# Patient Record
Sex: Male | Born: 1960
Health system: Southern US, Community
[De-identification: ages and names within clinical notes are randomized; demographics above are authoritative.]

## PROBLEM LIST (undated history)

## (undated) DIAGNOSIS — I471 Supraventricular tachycardia, unspecified: Secondary | ICD-10-CM

## (undated) DIAGNOSIS — R51 Headache: Secondary | ICD-10-CM

## (undated) DIAGNOSIS — R519 Headache, unspecified: Secondary | ICD-10-CM

## (undated) DIAGNOSIS — R011 Cardiac murmur, unspecified: Secondary | ICD-10-CM

## (undated) DIAGNOSIS — K219 Gastro-esophageal reflux disease without esophagitis: Secondary | ICD-10-CM

## (undated) DIAGNOSIS — M1000 Idiopathic gout, unspecified site: Secondary | ICD-10-CM

## (undated) DIAGNOSIS — Z8672 Personal history of thrombophlebitis: Secondary | ICD-10-CM

## (undated) DIAGNOSIS — K5790 Diverticulosis of intestine, part unspecified, without perforation or abscess without bleeding: Secondary | ICD-10-CM

## (undated) DIAGNOSIS — I10 Essential (primary) hypertension: Secondary | ICD-10-CM

## (undated) DIAGNOSIS — U071 COVID-19: Secondary | ICD-10-CM

## (undated) DIAGNOSIS — Z8679 Personal history of other diseases of the circulatory system: Secondary | ICD-10-CM

## (undated) DIAGNOSIS — Z8619 Personal history of other infectious and parasitic diseases: Secondary | ICD-10-CM

## (undated) DIAGNOSIS — E785 Hyperlipidemia, unspecified: Secondary | ICD-10-CM

## (undated) HISTORY — DX: Diverticulosis of intestine, part unspecified, without perforation or abscess without bleeding: K57.90

## (undated) HISTORY — DX: Personal history of thrombophlebitis: Z86.72

## (undated) HISTORY — DX: Hyperlipidemia, unspecified: E78.5

## (undated) HISTORY — DX: Cardiac murmur, unspecified: R01.1

## (undated) HISTORY — DX: Gastro-esophageal reflux disease without esophagitis: K21.9

## (undated) HISTORY — DX: Personal history of other diseases of the circulatory system: Z86.79

## (undated) HISTORY — DX: Personal history of other infectious and parasitic diseases: Z86.19

## (undated) HISTORY — DX: COVID-19: U07.1

## (undated) HISTORY — DX: Essential (primary) hypertension: I10

## (undated) HISTORY — DX: Idiopathic gout, unspecified site: M10.00

## (undated) HISTORY — PX: COLONOSCOPY: SHX174

## (undated) HISTORY — PX: HERNIA REPAIR: SHX51

## (undated) HISTORY — DX: Supraventricular tachycardia, unspecified: I47.10

## (undated) HISTORY — DX: Headache, unspecified: R51.9

## (undated) HISTORY — DX: Headache: R51

---

## 2007-09-01 LAB — PULMONARY FUNCTION TEST

## 2011-05-22 LAB — HM COLONOSCOPY: HM Colonoscopy: NORMAL

## 2012-09-06 HISTORY — PX: OTHER SURGICAL HISTORY: SHX169

## 2013-06-30 ENCOUNTER — Encounter: Payer: Self-pay | Admitting: Internal Medicine

## 2013-06-30 ENCOUNTER — Ambulatory Visit (INDEPENDENT_AMBULATORY_CARE_PROVIDER_SITE_OTHER): Payer: 59 | Admitting: Internal Medicine

## 2013-06-30 VITALS — BP 122/82 | HR 70 | Temp 98.7°F | Resp 16 | Ht 68.0 in | Wt 222.0 lb

## 2013-06-30 DIAGNOSIS — L502 Urticaria due to cold and heat: Secondary | ICD-10-CM

## 2013-06-30 DIAGNOSIS — R1310 Dysphagia, unspecified: Secondary | ICD-10-CM | POA: Insufficient documentation

## 2013-06-30 DIAGNOSIS — E785 Hyperlipidemia, unspecified: Secondary | ICD-10-CM | POA: Insufficient documentation

## 2013-06-30 DIAGNOSIS — I10 Essential (primary) hypertension: Secondary | ICD-10-CM

## 2013-06-30 DIAGNOSIS — J209 Acute bronchitis, unspecified: Secondary | ICD-10-CM

## 2013-06-30 DIAGNOSIS — K219 Gastro-esophageal reflux disease without esophagitis: Secondary | ICD-10-CM

## 2013-06-30 LAB — LIPID PANEL
Cholesterol: 177 mg/dL (ref 0–200)
Glucose: 101
LDL Cholesterol: 94 mg/dL
Triglycerides: 206

## 2013-06-30 MED ORDER — AZITHROMYCIN 500 MG PO TABS: 500.0000 mg | ORAL_TABLET | Freq: Every day | ORAL | Status: DC

## 2013-06-30 MED ORDER — ROSUVASTATIN CALCIUM 10 MG PO TABS: 10.0000 mg | ORAL_TABLET | Freq: Every day | ORAL | Status: DC

## 2013-06-30 MED ORDER — ESOMEPRAZOLE MAGNESIUM 40 MG PO CPDR: 40.0000 mg | DELAYED_RELEASE_CAPSULE | Freq: Every day | ORAL | Status: DC

## 2013-06-30 MED ORDER — METOPROLOL SUCCINATE ER 50 MG PO TB24
50.0000 mg | ORAL_TABLET | Freq: Two times a day (BID) | ORAL | Status: DC
Start: 2013-06-30 — End: 2013-07-02

## 2013-06-30 NOTE — Assessment & Plan Note (Signed)
Will treat with Zpak

## 2013-06-30 NOTE — Assessment & Plan Note (Signed)
GI referral - I question the need for an EGD

## 2013-06-30 NOTE — Assessment & Plan Note (Signed)
Goal achieved 

## 2013-06-30 NOTE — Assessment & Plan Note (Signed)
He needs better symptom relief so I have changed the zantac to nexium

## 2013-06-30 NOTE — Progress Notes (Signed)
Subjective:    Patient ID: Mark [name], male    DOB: 1961-06-08, 52 y.o.   MRN: [id number]  HPI Comments: New to me for an acute visit, no records available from MD's in [place].  Gastrophageal Reflux He complains of belching, choking, coughing, dysphagia, globus sensation, heartburn, a hoarse voice and water brash. He reports no abdominal pain, no chest pain, no early satiety, no nausea, no sore throat, no stridor, no tooth decay or no wheezing. This is a chronic problem. The current episode started more than 1 year ago. The problem occurs frequently. The problem has been gradually worsening. The heartburn duration is several minutes. The heartburn is located in the substernum. The heartburn is of moderate intensity. The heartburn wakes him from sleep. The heartburn does not limit his activity. The heartburn changes with position. Nothing aggravates the symptoms. Associated symptoms include fatigue. Pertinent negatives include no anemia, melena, muscle weakness, orthopnea or weight loss. He has tried a histamine-2 antagonist and an antacid for the symptoms. The treatment provided mild relief. Past procedures include an EGD (3-4 years ago showed erosive changes by his report).  Cough This is a new problem. The current episode started in the past 7 days. The problem has been unchanged. The cough is productive of purulent sputum. Associated symptoms include chills, ear pain, a fever, heartburn, myalgias and sweats. Pertinent negatives include no chest pain, ear congestion, hemoptysis, nasal congestion, postnasal drip, rash, rhinorrhea, sore throat, shortness of breath, weight loss or wheezing. Nothing aggravates the symptoms. He has tried nothing for the symptoms.      Review of Systems  Constitutional: Positive for fever, chills and fatigue. Negative for weight loss, diaphoresis, activity change, appetite change and unexpected weight change.  HENT: Positive for ear pain, hoarse voice and trouble  swallowing (odynophagia). Negative for congestion, sore throat, rhinorrhea, sneezing, voice change, postnasal drip and sinus pressure.   Eyes: Negative.   Respiratory: Positive for cough and choking. Negative for hemoptysis, chest tightness, shortness of breath, wheezing and stridor.   Cardiovascular: Negative for chest pain, palpitations and leg swelling.  Gastrointestinal: Positive for heartburn and dysphagia. Negative for nausea, vomiting, abdominal pain, diarrhea, constipation, blood in stool, melena, abdominal distention, anal bleeding and rectal pain.  Endocrine: Negative.   Genitourinary: Negative.   Musculoskeletal: Positive for myalgias. Negative for back pain, joint swelling, arthralgias, gait problem and muscle weakness.  Skin: Negative.  Negative for color change, pallor, rash and wound.  Allergic/Immunologic: Negative.   Neurological: Negative.   Hematological: Negative.  Negative for adenopathy. Does not bruise/bleed easily.  Psychiatric/Behavioral: Negative.        Objective:   Physical Exam  Vitals reviewed. Constitutional: He is oriented to person, place, and time. He appears well-developed and well-nourished.  Non-toxic appearance. He does not have a sickly appearance. He does not appear ill. No distress.  HENT:  Head: Normocephalic and atraumatic.  Right Ear: Hearing, tympanic membrane, external ear and ear canal normal.  Left Ear: Hearing, tympanic membrane, external ear and ear canal normal.  Nose: No mucosal edema or rhinorrhea. Right sinus exhibits no maxillary sinus tenderness and no frontal sinus tenderness. Left sinus exhibits no maxillary sinus tenderness and no frontal sinus tenderness.  Mouth/Throat: Mucous membranes are normal. Mucous membranes are not pale, not dry and not cyanotic. No oral lesions. No trismus in the jaw. No edematous. Posterior oropharyngeal erythema present. No oropharyngeal exudate, posterior oropharyngeal edema or tonsillar abscesses.   Eyes: Conjunctivae are normal. Right eye exhibits no discharge.  Left eye exhibits no discharge. No scleral icterus.  Neck: Normal range of motion. Neck supple. No JVD present. No tracheal deviation present. No thyromegaly present.  Cardiovascular: Normal rate, regular rhythm, normal heart sounds and intact distal pulses.  Exam reveals no gallop and no friction rub.   No murmur heard. Pulmonary/Chest: Effort normal and breath sounds normal. No stridor. No respiratory distress. He has no wheezes. He has no rales. He exhibits no tenderness.  Abdominal: Soft. Bowel sounds are normal. He exhibits no distension and no mass. There is no tenderness. There is no rebound and no guarding.  Musculoskeletal: Normal range of motion. He exhibits no edema and no tenderness.  Lymphadenopathy:    He has no cervical adenopathy.  Neurological: He is oriented to person, place, and time.  Skin: Skin is warm and dry. No rash noted. He is not diaphoretic. No erythema. No pallor.  Psychiatric: He has a normal mood and affect. His behavior is normal. Judgment and thought content normal.     No results found for this basename: WBC, HGB, HCT, PLT, GLUCOSE, CHOL, TRIG, HDL, LDLDIRECT, LDLCALC, ALT, AST, NA, K, CL, CREATININE, BUN, CO2, TSH, PSA, INR, GLUF, HGBA1C, MICROALBUR       Assessment & Plan:

## 2013-06-30 NOTE — Assessment & Plan Note (Signed)
BP is well controlled 

## 2013-06-30 NOTE — Patient Instructions (Signed)
Acute Bronchitis You have acute bronchitis. This means you have a chest cold. The airways in your lungs are red and sore (inflamed). Acute means it is sudden onset.  CAUSES Bronchitis is most often caused by the same virus that causes a cold. SYMPTOMS   Body aches.  Chest congestion.  Chills.  Cough.  Fever.  Shortness of breath.  Sore throat. TREATMENT  Acute bronchitis is usually treated with rest, fluids, and medicines for relief of fever or cough. Most symptoms should go away after a few days or a week. Increased fluids may help thin your secretions and will prevent dehydration. Your caregiver may give you an inhaler to improve your symptoms. The inhaler reduces shortness of breath and helps control cough. You can take over-the-counter pain relievers or cough medicine to decrease coughing, pain, or fever. A cool-air vaporizer may help thin bronchial secretions and make it easier to clear your chest. Antibiotics are usually not needed but can be prescribed if you smoke, are seriously ill, have chronic lung problems, are elderly, or you are at higher risk for developing complications.Allergies and asthma can make bronchitis worse. Repeated episodes of bronchitis may cause longstanding lung problems. Avoid smoking and secondhand smoke.Exposure to cigarette smoke or irritating chemicals will make bronchitis worse. If you are a cigarette smoker, consider using nicotine gum or skin patches to help control withdrawal symptoms. Quitting smoking will help your lungs heal faster. Recovery from bronchitis is often slow, but you should start feeling better after 2 to 3 days. Cough from bronchitis frequently lasts for 3 to 4 weeks. To prevent another bout of acute bronchitis:  Quit smoking.  Wash your hands frequently to get rid of viruses or use a hand sanitizer.  Avoid other people with cold or virus symptoms.  Try not to touch your hands to your mouth, nose, or eyes. SEEK IMMEDIATE  MEDICAL CARE IF:  You develop increased fever, chills, or chest pain.  You have severe shortness of breath or bloody sputum.  You develop dehydration, fainting, repeated vomiting, or a severe headache.  You have no improvement after 1 week of treatment or you get worse. MAKE SURE YOU:   Understand these instructions.  Will watch your condition.  Will get help right away if you are not doing well or get worse. Document Released: 10/31/2004 Document Revised: 12/16/2011 Document Reviewed: 01/16/2011 [facility] Patient Information 2014 [place], [place]. Gastroesophageal Reflux Disease, Adult Gastroesophageal reflux disease (GERD) happens when acid from your stomach flows up into the esophagus. When acid comes in contact with the esophagus, the acid causes soreness (inflammation) in the esophagus. Over time, GERD may create small holes (ulcers) in the lining of the esophagus. CAUSES   Increased body weight. This puts pressure on the stomach, making acid rise from the stomach into the esophagus.  Smoking. This increases acid production in the stomach.  Drinking alcohol. This causes decreased pressure in the lower esophageal sphincter (valve or ring of muscle between the esophagus and stomach), allowing acid from the stomach into the esophagus.  Late evening meals and a full stomach. This increases pressure and acid production in the stomach.  A malformed lower esophageal sphincter. Sometimes, no cause is found. SYMPTOMS   Burning pain in the lower part of the mid-chest behind the breastbone and in the mid-stomach area. This may occur twice a week or more often.  Trouble swallowing.  Sore throat.  Dry cough.  Asthma-like symptoms including chest tightness, shortness of breath, or wheezing. DIAGNOSIS  Your  caregiver may be able to diagnose GERD based on your symptoms. In some cases, X-rays and other tests may be done to check for complications or to check the condition of your  stomach and esophagus. TREATMENT  Your caregiver may recommend over-the-counter or prescription medicines to help decrease acid production. Ask your caregiver before starting or adding any new medicines.  HOME CARE INSTRUCTIONS   Change the factors that you can control. Ask your caregiver for guidance concerning weight loss, quitting smoking, and alcohol consumption.  Avoid foods and drinks that make your symptoms worse, such as:  Caffeine or alcoholic drinks.  Chocolate.  Peppermint or mint flavorings.  Garlic and onions.  Spicy foods.  Citrus fruits, such as oranges, lemons, or limes.  Tomato-based foods such as sauce, chili, salsa, and pizza.  Fried and fatty foods.  Avoid lying down for the 3 hours prior to your bedtime or prior to taking a nap.  Eat small, frequent meals instead of large meals.  Wear loose-fitting clothing. Do not wear anything tight around your waist that causes pressure on your stomach.  Raise the head of your bed 6 to 8 inches with wood blocks to help you sleep. Extra pillows will not help.  Only take over-the-counter or prescription medicines for pain, discomfort, or fever as directed by your caregiver.  Do not take aspirin, ibuprofen, or other nonsteroidal anti-inflammatory drugs (NSAIDs). SEEK IMMEDIATE MEDICAL CARE IF:   You have pain in your arms, neck, jaw, teeth, or back.  Your pain increases or changes in intensity or duration.  You develop nausea, vomiting, or sweating (diaphoresis).  You develop shortness of breath, or you faint.  Your vomit is green, yellow, black, or looks like coffee grounds or blood.  Your stool is red, bloody, or black. These symptoms could be signs of other problems, such as heart disease, gastric bleeding, or esophageal bleeding. MAKE SURE YOU:   Understand these instructions.  Will watch your condition.  Will get help right away if you are not doing well or get worse. Document Released: 07/03/2005  Document Revised: 12/16/2011 Document Reviewed: 04/12/2011 [facility] Patient Information 2014 [place], [place].

## 2013-07-01 ENCOUNTER — Encounter: Payer: Self-pay | Admitting: Internal Medicine

## 2013-07-01 DIAGNOSIS — K219 Gastro-esophageal reflux disease without esophagitis: Secondary | ICD-10-CM

## 2013-07-01 DIAGNOSIS — I10 Essential (primary) hypertension: Secondary | ICD-10-CM

## 2013-07-01 DIAGNOSIS — R1310 Dysphagia, unspecified: Secondary | ICD-10-CM

## 2013-07-01 DIAGNOSIS — E785 Hyperlipidemia, unspecified: Secondary | ICD-10-CM

## 2013-07-02 ENCOUNTER — Other Ambulatory Visit: Payer: Self-pay | Admitting: Internal Medicine

## 2013-07-02 MED ORDER — ESOMEPRAZOLE MAGNESIUM 40 MG PO CPDR: 40.0000 mg | DELAYED_RELEASE_CAPSULE | Freq: Every day | ORAL | Status: DC

## 2013-07-02 MED ORDER — METOPROLOL SUCCINATE ER 50 MG PO TB24: 50.0000 mg | ORAL_TABLET | Freq: Two times a day (BID) | ORAL | Status: DC

## 2013-07-02 MED ORDER — ROSUVASTATIN CALCIUM 10 MG PO TABS: 10.0000 mg | ORAL_TABLET | Freq: Every day | ORAL | Status: DC

## 2013-07-27 ENCOUNTER — Telehealth: Payer: Self-pay

## 2013-07-27 DIAGNOSIS — I10 Essential (primary) hypertension: Secondary | ICD-10-CM

## 2013-07-27 MED ORDER — METOPROLOL SUCCINATE ER 50 MG PO TB24: 50.0000 mg | ORAL_TABLET | Freq: Every day | ORAL | Status: DC

## 2013-07-27 NOTE — Telephone Encounter (Signed)
Updated RX sent to pharmacy.

## 2013-07-27 NOTE — Telephone Encounter (Signed)
Received fax requesting clarification on toprol XL 50 mg. RX in system shows 1 tab BID #, should this be QD or BID?

## 2013-07-27 NOTE — Telephone Encounter (Signed)
Qd for the XL product

## 2013-08-09 ENCOUNTER — Other Ambulatory Visit: Payer: Self-pay | Admitting: Gastroenterology

## 2013-08-09 DIAGNOSIS — R1310 Dysphagia, unspecified: Secondary | ICD-10-CM

## 2013-08-23 ENCOUNTER — Other Ambulatory Visit: Payer: 59

## 2013-09-06 ENCOUNTER — Ambulatory Visit (INDEPENDENT_AMBULATORY_CARE_PROVIDER_SITE_OTHER): Payer: 59 | Admitting: Internal Medicine

## 2013-09-06 ENCOUNTER — Encounter: Payer: Self-pay | Admitting: Internal Medicine

## 2013-09-06 VITALS — BP 116/72 | HR 61 | Temp 98.5°F | Resp 16 | Ht 68.0 in | Wt 215.0 lb

## 2013-09-06 DIAGNOSIS — Z23 Encounter for immunization: Secondary | ICD-10-CM

## 2013-09-06 DIAGNOSIS — Z0000 Encounter for general adult medical examination without abnormal findings: Secondary | ICD-10-CM

## 2013-09-06 NOTE — Assessment & Plan Note (Signed)
Exam done Vaccines were updated Labs ordered Pt ed material was given 

## 2013-09-06 NOTE — Patient Instructions (Signed)
Health Maintenance, Males A healthy lifestyle and preventative care can promote health and wellness.  Maintain regular health, dental, and eye exams.  Eat a healthy diet. Foods like vegetables, fruits, whole grains, low-fat dairy products, and lean protein foods contain the nutrients you need without too many calories. Decrease your intake of foods high in solid fats, added sugars, and salt. Get information about a proper diet from your caregiver, if necessary.  Regular physical exercise is one of the most important things you can do for your health. Most adults should get at least 150 minutes of moderate-intensity exercise (any activity that increases your heart rate and causes you to sweat) each week. In addition, most adults need muscle-strengthening exercises on 2 or more days a week.   Maintain a healthy weight. The body mass index (BMI) is a screening tool to identify possible weight problems. It provides an estimate of body fat based on height and weight. Your caregiver can help determine your BMI, and can help you achieve or maintain a healthy weight. For adults 20 years and older:  A BMI below 18.5 is considered underweight.  A BMI of 18.5 to 24.9 is normal.  A BMI of 25 to 29.9 is considered overweight.  A BMI of 30 and above is considered obese.  Maintain normal blood lipids and cholesterol by exercising and minimizing your intake of saturated fat. Eat a balanced diet with plenty of fruits and vegetables. Blood tests for lipids and cholesterol should begin at age 20 and be repeated every 5 years. If your lipid or cholesterol levels are high, you are over 50, or you are a high risk for heart disease, you may need your cholesterol levels checked more frequently.Ongoing high lipid and cholesterol levels should be treated with medicines, if diet and exercise are not effective.  If you smoke, find out from your caregiver how to quit. If you do not use tobacco, do not start.  Lung  cancer screening is recommended for adults aged 55 80 years who are at high risk for developing lung cancer because of a history of smoking. Yearly low-dose computed tomography (CT) is recommended for people who have at least a 30-pack-year history of smoking and are a current smoker or have quit within the past 15 years. A pack year of smoking is smoking an average of 1 pack of cigarettes a day for 1 year (for example: 1 pack a day for 30 years or 2 packs a day for 15 years). Yearly screening should continue until the smoker has stopped smoking for at least 15 years. Yearly screening should also be stopped for people who develop a health problem that would prevent them from having lung cancer treatment.  If you choose to drink alcohol, do not exceed 2 drinks per day. One drink is considered to be 12 ounces (355 mL) of beer, 5 ounces (148 mL) of wine, or 1.5 ounces (44 mL) of liquor.  Avoid use of street drugs. Do not share needles with anyone. Ask for help if you need support or instructions about stopping the use of drugs.  High blood pressure causes heart disease and increases the risk of stroke. Blood pressure should be checked at least every 1 to 2 years. Ongoing high blood pressure should be treated with medicines if weight loss and exercise are not effective.  If you are 45 to 53 years old, ask your caregiver if you should take aspirin to prevent heart disease.  Diabetes screening involves taking a blood   sample to check your fasting blood sugar level. This should be done once every 3 years, after age 45, if you are within normal weight and without risk factors for diabetes. Testing should be considered at a younger age or be carried out more frequently if you are overweight and have at least 1 risk factor for diabetes.  Colorectal cancer can be detected and often prevented. Most routine colorectal cancer screening begins at the age of 50 and continues through age 75. However, your caregiver may  recommend screening at an earlier age if you have risk factors for colon cancer. On a yearly basis, your caregiver may provide home test kits to check for hidden blood in the stool. Use of a small camera at the end of a tube, to directly examine the colon (sigmoidoscopy or colonoscopy), can detect the earliest forms of colorectal cancer. Talk to your caregiver about this at age 50, when routine screening begins. Direct examination of the colon should be repeated every 5 to 10 years through age 75, unless early forms of pre-cancerous polyps or small growths are found.  Hepatitis C blood testing is recommended for all people born from 1945 through 1965 and any individual with known risks for hepatitis C.  Healthy men should no longer receive prostate-specific antigen (PSA) blood tests as part of routine cancer screening. Consult with your caregiver about prostate cancer screening.  Testicular cancer screening is not recommended for adolescents or adult males who have no symptoms. Screening includes self-exam, caregiver exam, and other screening tests. Consult with your caregiver about any symptoms you have or any concerns you have about testicular cancer.  Practice safe sex. Use condoms and avoid high-risk sexual practices to reduce the spread of sexually transmitted infections (STIs).  Use sunscreen. Apply sunscreen liberally and repeatedly throughout the day. You should seek shade when your shadow is shorter than you. Protect yourself by wearing long sleeves, pants, a wide-brimmed hat, and sunglasses year round, whenever you are outdoors.  Notify your caregiver of new moles or changes in moles, especially if there is a change in shape or color. Also notify your caregiver if a mole is larger than the size of a pencil eraser.  A one-time screening for abdominal aortic aneurysm (AAA) and surgical repair of large AAAs by sound wave imaging (ultrasonography) is recommended for ages 65 to 75 years who are  current or former smokers.  Stay current with your immunizations. Document Released: 03/21/2008 Document Revised: 01/18/2013 Document Reviewed: 02/18/2011 [employer] Patient Information 2014 [employer].  

## 2013-09-06 NOTE — Progress Notes (Signed)
Pre visit review using our clinic review tool, if applicable. No additional management support is needed unless otherwise documented below in the visit note. 

## 2013-09-06 NOTE — Progress Notes (Signed)
   Subjective:    Patient ID: Mark [name], male    DOB: Feb 12, 1961, 52 y.o.   MRN: [id number]  HPI  He returns for a complete physical - he tells me that he feels well and offers no complaints. He saw GI and was told to take the nexium BID and that has resolved his UGI s/s.  Review of Systems  Constitutional: Negative.  Negative for fever, chills, diaphoresis, activity change, appetite change, fatigue and unexpected weight change.  HENT: Negative.   Eyes: Negative.   Respiratory: Negative.  Negative for cough, chest tightness, shortness of breath, wheezing and stridor.   Cardiovascular: Negative.  Negative for chest pain, palpitations and leg swelling.  Gastrointestinal: Negative.  Negative for nausea, vomiting, abdominal pain, diarrhea, constipation, blood in stool, abdominal distention, anal bleeding and rectal pain.  Endocrine: Negative.   Genitourinary: Negative.  Negative for difficulty urinating.  Musculoskeletal: Negative.   Skin: Negative.   Allergic/Immunologic: Negative.   Neurological: Negative.  Negative for dizziness, tremors, weakness, light-headedness, numbness and headaches.  Hematological: Negative.  Negative for adenopathy. Does not bruise/bleed easily.  Psychiatric/Behavioral: Negative.        Objective:   Physical Exam  Vitals reviewed. Constitutional: He is oriented to person, place, and time. He appears well-developed and well-nourished. No distress.  HENT:  Head: Normocephalic and atraumatic.  Mouth/Throat: Oropharynx is clear and moist. No oropharyngeal exudate.  Eyes: Conjunctivae are normal. Right eye exhibits no discharge. Left eye exhibits no discharge. No scleral icterus.  Neck: Normal range of motion. Neck supple. No JVD present. No tracheal deviation present. No thyromegaly present.  Cardiovascular: Normal rate, regular rhythm, normal heart sounds and intact distal pulses.  Exam reveals no gallop and no friction rub.   No murmur  heard. Pulmonary/Chest: Effort normal and breath sounds normal. No stridor. No respiratory distress. He has no wheezes. He has no rales. He exhibits no tenderness.  Abdominal: Soft. Bowel sounds are normal. He exhibits no distension and no mass. There is no tenderness. There is no rebound and no guarding. Hernia confirmed negative in the right inguinal area and confirmed negative in the left inguinal area.  Genitourinary: Rectum normal, prostate normal, testes normal and penis normal. Rectal exam shows no external hemorrhoid, no internal hemorrhoid, no fissure, no mass, no tenderness and anal tone normal. Guaiac negative stool. Prostate is not enlarged and not tender. Right testis shows no mass, no swelling and no tenderness. Right testis is descended. Left testis shows no mass, no swelling and no tenderness. Left testis is descended. Circumcised. No penile erythema or penile tenderness. No discharge found.  Musculoskeletal: Normal range of motion. He exhibits no edema and no tenderness.  Lymphadenopathy:    He has no cervical adenopathy.       Right: No inguinal adenopathy present.       Left: No inguinal adenopathy present.  Neurological: He is oriented to person, place, and time.  Skin: Skin is warm and dry. No rash noted. He is not diaphoretic. No erythema. No pallor.  Psychiatric: He has a normal mood and affect. His behavior is normal. Judgment and thought content normal.     Lab Results  Component Value Date   CHOL 177 06/04/2013   TRIG 206 06/04/2013   HDL 42 06/04/2013   LDLCALC 94 06/04/2013       Assessment & Plan:

## 2013-09-14 LAB — BASIC METABOLIC PANEL
Glucose: 104 mg/dL
Potassium: 4.3 mmol/L (ref 3.4–5.3)
Sodium: 143 mmol/L (ref 137–147)

## 2013-09-14 LAB — CBC AND DIFFERENTIAL
HCT: 41 % (ref 41–53)
Hemoglobin: 14.3 g/dL (ref 13.5–17.5)
Neutrophils Absolute: 4 /uL
Platelets: 250 10*3/uL (ref 150–399)

## 2013-09-14 LAB — LIPID PANEL
Cholesterol: 140 mg/dL (ref 0–200)
HDL: 42 mg/dL (ref 35–70)

## 2013-09-15 ENCOUNTER — Encounter: Payer: Self-pay | Admitting: Internal Medicine

## 2013-09-15 LAB — CBC WITH DIFFERENTIAL
Grans (Absolute): 0
MCH: 30.1
MCHC: 35
MCV: 86 fL
Monocytes(Absolute): 0.7
RBC: 4.75

## 2013-09-15 LAB — URINALYSIS
Bilirubin Urine: 0 mg/dL
Glucose, Ur: NEGATIVE
Urobilinogen, Ur: 0.2
WBC, UA: NEGATIVE
pH, UA: 6 (ref 4.5–8.0)

## 2013-09-15 LAB — HEPATITIS C ANTIBODY: HCV Ab: <0.1

## 2013-09-15 LAB — CMP14+EGFR
Albumin/Globulin Ratio: 2.6
BUN/Creatinine Ratio: 10
Calcium: 8.7 mg/dL (ref 8.7–10.7)
Chloride, Serum: 103
EGFR: 65 mg/dL
EGFR: 75 mg/dL
Globulin, Total: 1.6

## 2013-09-15 LAB — PSA: PSA: 0.3

## 2013-10-04 ENCOUNTER — Encounter: Payer: Self-pay | Admitting: Internal Medicine

## 2014-02-14 ENCOUNTER — Other Ambulatory Visit: Payer: Self-pay | Admitting: *Deleted

## 2014-02-14 DIAGNOSIS — K219 Gastro-esophageal reflux disease without esophagitis: Secondary | ICD-10-CM

## 2014-02-14 DIAGNOSIS — R1310 Dysphagia, unspecified: Secondary | ICD-10-CM

## 2014-02-14 MED ORDER — ESOMEPRAZOLE MAGNESIUM 40 MG PO CPDR: 40.0000 mg | DELAYED_RELEASE_CAPSULE | Freq: Every day | ORAL | Status: DC

## 2014-06-23 ENCOUNTER — Ambulatory Visit (INDEPENDENT_AMBULATORY_CARE_PROVIDER_SITE_OTHER): Payer: 59 | Admitting: Internal Medicine

## 2014-06-23 ENCOUNTER — Encounter: Payer: Self-pay | Admitting: Internal Medicine

## 2014-06-23 VITALS — BP 112/70 | HR 61 | Temp 98.5°F | Resp 16 | Ht 68.0 in | Wt 211.0 lb

## 2014-06-23 DIAGNOSIS — E785 Hyperlipidemia, unspecified: Secondary | ICD-10-CM

## 2014-06-23 DIAGNOSIS — K2100 Gastro-esophageal reflux disease with esophagitis, without bleeding: Secondary | ICD-10-CM

## 2014-06-23 DIAGNOSIS — Z23 Encounter for immunization: Secondary | ICD-10-CM

## 2014-06-23 DIAGNOSIS — Z0000 Encounter for general adult medical examination without abnormal findings: Secondary | ICD-10-CM

## 2014-06-23 DIAGNOSIS — I10 Essential (primary) hypertension: Secondary | ICD-10-CM

## 2014-06-23 LAB — FECAL OCCULT BLOOD, GUAIAC: Fecal Occult Blood: NEGATIVE

## 2014-06-23 MED ORDER — ESOMEPRAZOLE MAGNESIUM 40 MG PO CPDR: 40.0000 mg | DELAYED_RELEASE_CAPSULE | Freq: Two times a day (BID) | ORAL | Status: DC

## 2014-06-23 MED ORDER — ROSUVASTATIN CALCIUM 10 MG PO TABS
10.0000 mg | ORAL_TABLET | Freq: Every day | ORAL | Status: DC
Start: 2014-06-23 — End: 2015-06-08

## 2014-06-23 MED ORDER — RIZATRIPTAN BENZOATE 10 MG PO TABS: 10.0000 mg | ORAL_TABLET | ORAL | Status: AC | PRN

## 2014-06-23 MED ORDER — METOPROLOL SUCCINATE ER 50 MG PO TB24: 50.0000 mg | ORAL_TABLET | Freq: Every day | ORAL | Status: DC

## 2014-06-23 NOTE — Progress Notes (Signed)
Pre visit review using our clinic review tool, if applicable. No additional management support is needed unless otherwise documented below in the visit note. 

## 2014-06-23 NOTE — Progress Notes (Signed)
   Subjective:    Patient ID: Mark [name], male    DOB: [birth year], 53 y.o.   MRN: [id number]  HPI Comments: Her returns for a physical, he tells me that he feels well and offers no complaints.     Review of Systems  Constitutional: Negative for fever, chills, diaphoresis, appetite change and fatigue.  HENT: Negative.   Eyes: Negative.   Respiratory: Negative.  Negative for cough, choking, chest tightness, shortness of breath and stridor.   Cardiovascular: Negative.  Negative for chest pain, palpitations and leg swelling.  Gastrointestinal: Negative.  Negative for nausea, vomiting, abdominal pain, diarrhea, constipation and blood in stool.  Endocrine: Negative.   Genitourinary: Negative.  Negative for difficulty urinating.  Musculoskeletal: Negative.  Negative for arthralgias, back pain, joint swelling, myalgias and neck pain.  Skin: Negative.  Negative for rash.  Allergic/Immunologic: Negative.   Neurological: Negative.        His last HA was 6 months ago  Hematological: Negative.  Negative for adenopathy. Does not bruise/bleed easily.  Psychiatric/Behavioral: Negative.        Objective:   Physical Exam  Vitals reviewed. Constitutional: He is oriented to person, place, and time. He appears well-developed and well-nourished. No distress.  HENT:  Head: Normocephalic and atraumatic.  Mouth/Throat: Oropharynx is clear and moist. No oropharyngeal exudate.  Eyes: Conjunctivae are normal. Right eye exhibits no discharge. Left eye exhibits no discharge. No scleral icterus.  Neck: Normal range of motion. Neck supple. No JVD present. No tracheal deviation present. No thyromegaly present.  Cardiovascular: Normal rate, regular rhythm, normal heart sounds and intact distal pulses.  Exam reveals no gallop and no friction rub.   No murmur heard. Pulmonary/Chest: Effort normal and breath sounds normal. No stridor. No respiratory distress. He has no wheezes. He has no rales. He exhibits no  tenderness.  Abdominal: Soft. Bowel sounds are normal. He exhibits no distension and no mass. There is no tenderness. There is no rebound and no guarding. Hernia confirmed negative in the right inguinal area and confirmed negative in the left inguinal area.  Genitourinary: Rectum normal, prostate normal, testes normal and penis normal. Rectal exam shows no external hemorrhoid, no internal hemorrhoid, no fissure, no mass, no tenderness and anal tone normal. Guaiac negative stool. Prostate is not enlarged and not tender. Right testis shows no mass, no swelling and no tenderness. Right testis is descended. Left testis shows no mass, no swelling and no tenderness. Left testis is descended. Circumcised. No penile erythema or penile tenderness. No discharge found.  Musculoskeletal: Normal range of motion. He exhibits no edema and no tenderness.  Lymphadenopathy:    He has no cervical adenopathy.       Right: No inguinal adenopathy present.       Left: No inguinal adenopathy present.  Neurological: He is oriented to person, place, and time.  Skin: Skin is warm and dry. No rash noted. He is not diaphoretic. No erythema. No pallor.  Psychiatric: He has a normal mood and affect. His behavior is normal. Judgment and thought content normal.          Assessment & Plan:

## 2014-06-23 NOTE — Assessment & Plan Note (Signed)
Exam done Vaccines were updated Labs ordered Pt ed material was given 

## 2014-06-23 NOTE — Patient Instructions (Signed)

## 2014-08-19 ENCOUNTER — Ambulatory Visit: Payer: 59 | Admitting: Internal Medicine

## 2014-08-20 LAB — CBC WITH DIFFERENTIAL/PLATELET
BASO%: 0 %
BASOS ABS: 0 /uL
EOS ABS: 0 /uL
EOS%: 2 %
GRANULOCYTE PERCENT: 0 % — AB (ref 37–80)
Granulocyte count absolute: 0
Hematocrit: 43.4 % (ref 38.5–51)
Hemoglobin: 14.9 g/dL (ref 13–17)
LYMPH%: 29 %
Lymphs(Absolute): 1.8
MCH: 29.2
MCHC: 34.3
MCV: 85 fL (ref 80–98)
MONO: 9 /uL
Monocyes absolute: 0.6 10*3/uL (ref 0.1–1)
NEUTROPHILS RELATIVE % (KUC): 60 % (ref 44–76)
Neutrophils absolute (GR#): 3.8 10*3/uL (ref 1.7–7.7)
RBC: 5.1
RDW: 13.8
WBC: 6.3

## 2014-08-20 LAB — COMPREHENSIVE METABOLIC PANEL
ALK PHOS: 65 U/L
ALT: 11 U/L (ref 10–40)
AST: 17 U/L
Albumin/Globulin Ratio: 2.3
Albumin: 4.4
BUN/Creatinine Ratio: 76
BUN: 21 mg/dL (ref 4–21)
CALCIUM: 9.4 mg/dL
CHLORIDE: 100 mmol/L
CO2: 24 mmol/L
GFR, Est African American: 66
GFR, Est Non African American: 1.23
GLOBULIN, TOTAL: 1.9
GLUCOSE: 110
Potassium: 5.2 mmol/L
Sodium, serum: 17
Total Bilirubin: 0.5 mg/dL
Total Protein: 6.3 g/dL

## 2014-08-20 LAB — LIPID PANEL
CHOL/HDL RATIO: 3.2
CHOLESTEROL, TOTAL: 139
HDL: 44 mg/dL (ref 35–70)
LDL Calculated: 77 mg/dL
PSA: 0.3
TRIGLYCERIDES: 91
TSH: 1.35
VLDL Cholesterol Cal: 18

## 2014-08-26 ENCOUNTER — Encounter: Payer: Self-pay | Admitting: Internal Medicine

## 2014-08-26 ENCOUNTER — Telehealth: Payer: Self-pay | Admitting: Internal Medicine

## 2014-08-26 ENCOUNTER — Ambulatory Visit (INDEPENDENT_AMBULATORY_CARE_PROVIDER_SITE_OTHER): Payer: 59 | Admitting: Internal Medicine

## 2014-08-26 VITALS — BP 116/70 | HR 61 | Temp 98.4°F | Ht 68.0 in | Wt 212.0 lb

## 2014-08-26 DIAGNOSIS — M1000 Idiopathic gout, unspecified site: Secondary | ICD-10-CM | POA: Insufficient documentation

## 2014-08-26 DIAGNOSIS — I10 Essential (primary) hypertension: Secondary | ICD-10-CM

## 2014-08-26 DIAGNOSIS — L502 Urticaria due to cold and heat: Secondary | ICD-10-CM

## 2014-08-26 MED ORDER — IBUPROFEN 600 MG PO TABS: 600.0000 mg | ORAL_TABLET | Freq: Three times a day (TID) | ORAL | Status: DC | PRN

## 2014-08-26 MED ORDER — EPINEPHRINE 0.3 MG/0.3ML IJ SOAJ: 0.3000 mg | Freq: Once | INTRAMUSCULAR | Status: DC

## 2014-08-26 MED ORDER — COLCHICINE 0.6 MG PO TABS: 0.6000 mg | ORAL_TABLET | Freq: Two times a day (BID) | ORAL | Status: DC

## 2014-08-26 MED ORDER — FEBUXOSTAT 40 MG PO TABS: 40.0000 mg | ORAL_TABLET | Freq: Every day | ORAL | Status: DC

## 2014-08-26 NOTE — Progress Notes (Signed)
   Subjective:    Patient ID: Mark [name], male    DOB: 1961/02/19, 53 y.o.   MRN: [id number]  Arthritis Presents for follow-up visit. He complains of pain, stiffness and joint swelling. He reports no joint warmth. Affected locations include the left foot and right foot. His pain is at a severity of 3/10. Associated symptoms include pain at night and pain while resting. Pertinent negatives include no diarrhea, dry eyes, dry mouth, dysuria, fatigue, fever, rash, Raynaud's syndrome, uveitis or weight loss. His past medical history is significant for osteoarthritis. There is no history of chronic back pain, lupus, psoriasis or rheumatoid arthritis.  Past treatments include nothing.      Review of Systems  Constitutional: Negative.  Negative for fever, chills, weight loss, diaphoresis, appetite change and fatigue.  HENT: Negative.   Eyes: Negative.   Respiratory: Negative.   Cardiovascular: Negative.   Gastrointestinal: Negative.  Negative for nausea, diarrhea, constipation and blood in stool.  Endocrine: Negative.   Genitourinary: Negative.  Negative for dysuria.  Musculoskeletal: Positive for joint swelling, arthralgias, arthritis and stiffness.  Skin: Negative.  Negative for rash.  Allergic/Immunologic: Negative.   Neurological: Negative.   Hematological: Negative.  Negative for adenopathy.  Psychiatric/Behavioral: Negative.        Objective:   Physical Exam  Constitutional: He is oriented to person, place, and time. He appears well-developed and well-nourished. No distress.  HENT:  Head: Normocephalic and atraumatic.  Mouth/Throat: Oropharynx is clear and moist. No oropharyngeal exudate.  Eyes: Conjunctivae are normal. Right eye exhibits no discharge. Left eye exhibits no discharge. No scleral icterus.  Neck: Normal range of motion. Neck supple. No JVD present. No tracheal deviation present. No thyromegaly present.  Cardiovascular: Normal rate, regular rhythm, normal heart  sounds and intact distal pulses.  Exam reveals no gallop and no friction rub.   No murmur heard. Pulmonary/Chest: Effort normal and breath sounds normal. No stridor. No respiratory distress. He has no wheezes. He has no rales. He exhibits no tenderness.  Abdominal: Soft. Bowel sounds are normal. He exhibits no distension and no mass. There is no tenderness. There is no rebound and no guarding.  Musculoskeletal: Normal range of motion. He exhibits no edema.       Right foot: There is tenderness, bony tenderness and swelling. There is normal range of motion, normal capillary refill, no crepitus, no deformity and no laceration.       Feet:  Lymphadenopathy:    He has no cervical adenopathy.  Neurological: He is oriented to person, place, and time.  Skin: Skin is warm and dry. No rash noted. He is not diaphoretic. No erythema. No pallor.  Vitals reviewed.     Lab Results  Component Value Date   WBC 6.9 09/14/2013   HGB 14.3 09/14/2013   HCT 41 09/14/2013   PLT 250 09/14/2013   CHOL 140 09/14/2013   TRIG 106 09/14/2013   HDL 42 09/14/2013   LDLCALC 77 09/14/2013   ALT 18 09/14/2013   AST 25 09/14/2013   NA 143 09/14/2013   K 4.3 09/14/2013   CREATININE 1.3 09/14/2013   BUN 12 09/14/2013   TSH 1.71 09/14/2013   PSA 0.3 09/14/2013      Assessment & Plan:

## 2014-08-26 NOTE — Assessment & Plan Note (Signed)
His BP is well controlled 

## 2014-08-26 NOTE — Telephone Encounter (Signed)
Patient just seen Dr. Jenny Reichmann.  States Dr. Jenny Reichmann would need to call script [facility] Popper) in and they are also requesting PA.  He is also requesting an additional script to be called in to [facility].  FYI our fax has been down since this morning.  [facility] [place] [address]

## 2014-08-26 NOTE — Patient Instructions (Signed)

## 2014-08-26 NOTE — Progress Notes (Signed)
Pre visit review using our clinic review tool, if applicable. No additional management support is needed unless otherwise documented below in the visit note. 

## 2014-08-26 NOTE — Assessment & Plan Note (Signed)
He has had a recent flare that is now resolving Will check his uric acid level as well as his renal function today Will start uloric since he reports frequent flares over the last few years Will start motrin and colcrys to be used and as directed during flares

## 2014-08-29 ENCOUNTER — Telehealth: Payer: Self-pay | Admitting: Internal Medicine

## 2014-08-29 MED ORDER — ALLOPURINOL 100 MG PO TABS: 100.0000 mg | ORAL_TABLET | Freq: Every day | ORAL | Status: DC

## 2014-08-29 NOTE — Telephone Encounter (Signed)
Change to allopurinol

## 2014-08-29 NOTE — Telephone Encounter (Signed)
emmi emailed °

## 2014-08-30 MED ORDER — ALLOPURINOL 100 MG PO TABS: 100.0000 mg | ORAL_TABLET | Freq: Every day | ORAL | Status: DC

## 2014-08-30 NOTE — Telephone Encounter (Signed)
Spoke to pt. Pt needed to have rx sent to [facility]. This has been done.

## 2015-02-27 ENCOUNTER — Ambulatory Visit (INDEPENDENT_AMBULATORY_CARE_PROVIDER_SITE_OTHER): Payer: 59 | Admitting: Internal Medicine

## 2015-02-27 ENCOUNTER — Encounter: Payer: Self-pay | Admitting: Internal Medicine

## 2015-02-27 VITALS — BP 118/78 | HR 72 | Temp 98.2°F | Wt 207.0 lb

## 2015-02-27 DIAGNOSIS — M5414 Radiculopathy, thoracic region: Secondary | ICD-10-CM

## 2015-02-27 MED ORDER — GABAPENTIN 100 MG PO CAPS: ORAL_CAPSULE | ORAL | Status: DC

## 2015-02-27 NOTE — Progress Notes (Signed)
Pre visit review using our clinic review tool, if applicable. No additional management support is needed unless otherwise documented below in the visit note. 

## 2015-02-27 NOTE — Patient Instructions (Signed)
Assess response to the gabapentin one every 8 hours as needed. If it is partially beneficial, it can be increased up to a total of 3 pills every 8 hours as needed. This increase of 1 pill each dose  should take place over 72 hours at least.If 300 mg is effective dose ; there is a 300 mg pill. The best exercises for the low back include freestyle swimming, stretch aerobics, and yoga.Cybex & Nautilus machines rather than dead weights are better for the back.
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
## 2015-05-14 ENCOUNTER — Other Ambulatory Visit: Payer: Self-pay | Admitting: Internal Medicine

## 2015-06-08 ENCOUNTER — Encounter: Payer: Self-pay | Admitting: Internal Medicine

## 2015-06-08 ENCOUNTER — Ambulatory Visit (INDEPENDENT_AMBULATORY_CARE_PROVIDER_SITE_OTHER): Payer: 59 | Admitting: Internal Medicine

## 2015-06-08 VITALS — BP 118/80 | HR 60 | Temp 97.9°F | Resp 16 | Ht 68.0 in | Wt 207.0 lb

## 2015-06-08 DIAGNOSIS — M1000 Idiopathic gout, unspecified site: Secondary | ICD-10-CM

## 2015-06-08 DIAGNOSIS — K219 Gastro-esophageal reflux disease without esophagitis: Secondary | ICD-10-CM

## 2015-06-08 DIAGNOSIS — E785 Hyperlipidemia, unspecified: Secondary | ICD-10-CM

## 2015-06-08 DIAGNOSIS — M7661 Achilles tendinitis, right leg: Secondary | ICD-10-CM

## 2015-06-08 DIAGNOSIS — L502 Urticaria due to cold and heat: Secondary | ICD-10-CM

## 2015-06-08 DIAGNOSIS — K2100 Gastro-esophageal reflux disease with esophagitis, without bleeding: Secondary | ICD-10-CM

## 2015-06-08 DIAGNOSIS — Z23 Encounter for immunization: Secondary | ICD-10-CM

## 2015-06-08 DIAGNOSIS — M5414 Radiculopathy, thoracic region: Secondary | ICD-10-CM

## 2015-06-08 DIAGNOSIS — Z0000 Encounter for general adult medical examination without abnormal findings: Secondary | ICD-10-CM

## 2015-06-08 DIAGNOSIS — I10 Essential (primary) hypertension: Secondary | ICD-10-CM | POA: Diagnosis not present

## 2015-06-08 MED ORDER — IBUPROFEN 600 MG PO TABS: 600.0000 mg | ORAL_TABLET | Freq: Three times a day (TID) | ORAL | Status: DC | PRN

## 2015-06-08 MED ORDER — METOPROLOL SUCCINATE ER 50 MG PO TB24: ORAL_TABLET | ORAL | Status: DC

## 2015-06-08 MED ORDER — ROSUVASTATIN CALCIUM 10 MG PO TABS: 10.0000 mg | ORAL_TABLET | Freq: Every day | ORAL | Status: DC

## 2015-06-08 MED ORDER — COLCHICINE 0.6 MG PO TABS: 0.6000 mg | ORAL_TABLET | Freq: Two times a day (BID) | ORAL | Status: DC

## 2015-06-08 MED ORDER — ESOMEPRAZOLE MAGNESIUM 40 MG PO CPDR: 40.0000 mg | DELAYED_RELEASE_CAPSULE | Freq: Two times a day (BID) | ORAL | Status: DC

## 2015-06-08 MED ORDER — ALLOPURINOL 100 MG PO TABS: 100.0000 mg | ORAL_TABLET | Freq: Every day | ORAL | Status: DC

## 2015-06-08 MED ORDER — GABAPENTIN 100 MG PO CAPS: ORAL_CAPSULE | ORAL | Status: DC

## 2015-06-08 NOTE — Patient Instructions (Signed)
Achilles Tendinitis Achilles tendinitis is inflammation of the tough, cord-like band that attaches the lower muscles of your leg to your heel (Achilles tendon). It is usually caused by overusing the tendon and joint involved.  CAUSES Achilles tendinitis can happen because of:  A sudden increase in exercise or activity (such as running).  Doing the same exercises or activities (such as jumping) over and over.  Not warming up calf muscles before exercising.  Exercising in shoes that are worn out or not made for exercise.  Having arthritis or a bone growth on the back of the heel bone. This can rub against the tendon and hurt the tendon. SIGNS AND SYMPTOMS The most common symptoms are:  Pain in the back of the leg, just above the heel. The pain usually gets worse with exercise and better with rest.  Stiffness or soreness in the back of the leg, especially in the morning.  Swelling of the skin over the Achilles tendon.  Trouble standing on tiptoe. Sometimes, an Achilles tendon tears (ruptures). Symptoms of an Achilles tendon rupture can include:  Sudden, severe pain in the back of the leg.  Trouble putting weight on the foot or walking normally. DIAGNOSIS Achilles tendinitis will be diagnosed based on symptoms and a physical examination. An X-ray may be done to check if another condition is causing your symptoms. An MRI may be ordered if your health care provider suspects you may have completely torn your tendon, which is called an Achilles tendon rupture.  TREATMENT  Achilles tendinitis usually gets better over time. It can take weeks to months to heal completely. Treatment focuses on treating the symptoms and helping the injury heal. HOME CARE INSTRUCTIONS   Rest your Achilles tendon and avoid activities that cause pain.  Apply ice to the injured area:  Put ice in a plastic bag.  Place a towel between your skin and the bag.  Leave the ice on for 20 minutes, 2-3 times a  day  Try to avoid using the tendon (other than gentle range of motion) while the tendon is painful. Do not resume use until instructed by your health care provider. Then begin use gradually. Do not increase use to the point of pain. If pain does develop, decrease use and continue the above measures. Gradually increase activities that do not cause discomfort until you achieve normal use.  Do exercises to make your calf muscles stronger and more flexible. Your health care provider or physical therapist can recommend exercises for you to do.  Wrap your ankle with an elastic bandage or other wrap. This can help keep your tendon from moving too much. Your health care provider will show you how to wrap your ankle correctly.  Only take over-the-counter or prescription medicines for pain, discomfort, or fever as directed by your health care provider. SEEK MEDICAL CARE IF:   Your pain and swelling increase or pain is uncontrolled with medicines.  You develop new, unexplained symptoms or your symptoms get worse.  You are unable to move your toes or foot.  You develop warmth and swelling in your foot.  You have an unexplained temperature. MAKE SURE YOU:   Understand these instructions.  Will watch your condition.  Will get help right away if you are not doing well or get worse. Document Released: 07/03/2005 Document Revised: 07/14/2013 Document Reviewed: 05/05/2013 [employer] Patient Information 2015 [employer]. This information is not intended to replace advice given to you by your health care provider. Make sure you discuss   any questions you have with your health care provider.  

## 2015-06-08 NOTE — Progress Notes (Signed)
Pre visit review using our clinic review tool, if applicable. No additional management support is needed unless otherwise documented below in the visit note. 

## 2015-06-09 ENCOUNTER — Other Ambulatory Visit: Payer: Self-pay | Admitting: Internal Medicine

## 2015-06-09 DIAGNOSIS — G459 Transient cerebral ischemic attack, unspecified: Secondary | ICD-10-CM

## 2015-06-09 NOTE — Progress Notes (Signed)
Subjective:  Patient ID: Mark [name], male    DOB: 1961/09/10  Age: 54 y.o. MRN: [id number]  CC: Hypertension and Hyperlipidemia   HPI Kevion Fatheree presents for follow-up and refills. His main complaint today is that he has had pain in his right heel and right Achilles for several months. He can't recall any specific trauma or injury. He has not been taking anything for the discomfort. He has not noticed any bruising or swelling. Discomfort is only noticeable when he is active. He offers no other new or different complaints.  Outpatient Prescriptions Prior to Visit  Medication Sig Dispense Refill  . aspirin 325 MG tablet Take 325 mg by mouth daily.    Marland Kitchen EPINEPHrine (EPIPEN 2-PAK) 0.3 mg/0.3 mL IJ SOAJ injection Inject 0.3 mLs (0.3 mg total) into the muscle once. 5 Device 11  . Glucosamine-Chondroit-Vit C-Mn (GLUCOSAMINE 1500 COMPLEX PO) Take by mouth.    . Multiple Vitamins-Minerals (PRESERVISION AREDS 2 PO) Take by mouth.    . rizatriptan (MAXALT) 10 MG tablet Take 1 tablet (10 mg total) by mouth as needed for migraine. May repeat in 2 hours if needed 10 tablet 5  . Vitamin D, Ergocalciferol, (DRISDOL) 50000 UNITS CAPS capsule Take 50,000 Units by mouth.    Marland Kitchen allopurinol (ZYLOPRIM) 100 MG tablet Take 1 tablet (100 mg total) by mouth daily. 90 tablet 3  . colchicine 0.6 MG tablet Take 1 tablet (0.6 mg total) by mouth 2 (two) times daily. 60 tablet 3  . esomeprazole (NEXIUM) 40 MG capsule Take 1 capsule (40 mg total) by mouth 2 (two) times daily before a meal. 180 capsule 3  . gabapentin (NEURONTIN) 100 MG capsule One pill every eight hours as needed; dose may be increased by one pill each dose after 72 hours if only partially effective 30 capsule 2  . ibuprofen (ADVIL,MOTRIN) 600 MG tablet Take 1 tablet (600 mg total) by mouth every 8 (eight) hours as needed. 60 tablet 2  . metoprolol succinate (TOPROL-XL) 50 MG 24 hr tablet Take 1 tablet by mouth  daily with or immediately  following a  meal 90 tablet 0  . rosuvastatin (CRESTOR) 10 MG tablet Take 1 tablet (10 mg total) by mouth daily. 90 tablet 3   No facility-administered medications prior to visit.    ROS Review of Systems  Constitutional: Negative.  Negative for fever, chills, diaphoresis, appetite change and fatigue.  HENT: Negative.   Eyes: Negative.   Respiratory: Negative.  Negative for cough, choking, chest tightness, shortness of breath and stridor.   Cardiovascular: Negative.  Negative for chest pain, palpitations and leg swelling.  Gastrointestinal: Negative.  Negative for nausea, vomiting, abdominal pain, diarrhea, constipation and blood in stool.  Endocrine: Negative.   Genitourinary: Negative.  Negative for difficulty urinating.  Musculoskeletal: Positive for back pain and arthralgias. Negative for myalgias and neck pain.  Skin: Negative.   Allergic/Immunologic: Negative.   Neurological: Negative.  Negative for dizziness, tremors, weakness, light-headedness, numbness and headaches.  Hematological: Negative.  Negative for adenopathy. Does not bruise/bleed easily.  Psychiatric/Behavioral: Negative.     Objective:  BP 118/80 mmHg  Pulse 60  Temp(Src) 97.9 F (36.6 C) (Oral)  Resp 16  Ht 5\' 8"  (1.727 m)  Wt 207 lb (93.895 kg)  BMI 31.48 kg/m2  SpO2 97%  BP Readings from Last 3 Encounters:  06/08/15 118/80  02/27/15 118/78  08/26/14 116/70    Wt Readings from Last 3 Encounters:  06/08/15 207 lb (93.895 kg)  02/27/15  207 lb (93.895 kg)  08/26/14 212 lb (96.163 kg)    Physical Exam  Constitutional: He is oriented to person, place, and time. No distress.  HENT:  Head: Normocephalic and atraumatic.  Mouth/Throat: Oropharynx is clear and moist. No oropharyngeal exudate.  Eyes: Conjunctivae are normal. Right eye exhibits no discharge. Left eye exhibits no discharge. No scleral icterus.  Neck: Normal range of motion. Neck supple. No JVD present. No tracheal deviation present. No thyromegaly  present.  Cardiovascular: Normal rate, regular rhythm, normal heart sounds and intact distal pulses.  Exam reveals no gallop and no friction rub.   No murmur heard. Pulmonary/Chest: Effort normal and breath sounds normal. No stridor. No respiratory distress. He has no wheezes. He has no rales. He exhibits no tenderness.  Abdominal: Soft. Bowel sounds are normal. He exhibits no distension and no mass. There is no tenderness. There is no rebound and no guarding.  Musculoskeletal: Normal range of motion. He exhibits no edema or tenderness.       Right ankle: He exhibits normal range of motion, no swelling, no ecchymosis, no deformity, no laceration and normal pulse. No tenderness. No lateral malleolus and no medial malleolus tenderness found. Achilles tendon normal. Achilles tendon exhibits no pain, no defect and normal Thompson's test results.  Lymphadenopathy:    He has no cervical adenopathy.  Neurological: He is oriented to person, place, and time.  Skin: Skin is warm and dry. No rash noted. He is not diaphoretic. No erythema. No pallor.  Vitals reviewed.   Lab Results  Component Value Date   WBC 6.3 08/19/2014   HGB 14.3 09/14/2013   HCT 41 09/14/2013   PLT 250 09/14/2013   CHOL 139 08/19/2014   TRIG 91 08/19/2014   HDL 44 08/19/2014   LDLCALC 77 08/19/2014   ALT 11 08/19/2014   AST 17 08/19/2014   NA 143 09/14/2013   K 5.2 08/19/2014   CL 100 08/19/2014   CREATININE 1.3 09/14/2013   BUN 21 08/19/2014   CO2 24 08/19/2014   TSH 1.350 08/19/2014   PSA 0.3 08/19/2014    Patient was never admitted.  Assessment & Plan:   Cleland was seen today for hypertension and hyperlipidemia.  Diagnoses and all orders for this visit:  Essential hypertension, benign -     metoprolol succinate (TOPROL-XL) 50 MG 24 hr tablet; Take 1 tablet by mouth  daily with or immediately  following a meal  Need for prophylactic vaccination and inoculation against influenza -     Cancel: Flu vaccine  HIGH DOSE PF (Fluzone High dose)  Gastroesophageal reflux disease without esophagitis  Urticaria due to cold  Hyperlipidemia with target LDL less than 100 -     Discontinue: rosuvastatin (CRESTOR) 10 MG tablet; Take 1 tablet (10 mg total) by mouth daily. -     rosuvastatin (CRESTOR) 10 MG tablet; Take 1 tablet (10 mg total) by mouth daily.  Idiopathic gout, unspecified chronicity, unspecified site -     Discontinue: allopurinol (ZYLOPRIM) 100 MG tablet; Take 1 tablet (100 mg total) by mouth daily. -     Discontinue: colchicine 0.6 MG tablet; Take 1 tablet (0.6 mg total) by mouth 2 (two) times daily. -     Discontinue: ibuprofen (ADVIL,MOTRIN) 600 MG tablet; Take 1 tablet (600 mg total) by mouth every 8 (eight) hours as needed. -     colchicine 0.6 MG tablet; Take 1 tablet (0.6 mg total) by mouth 2 (two) times daily. -  allopurinol (ZYLOPRIM) 100 MG tablet; Take 1 tablet (100 mg total) by mouth daily. -     ibuprofen (ADVIL,MOTRIN) 600 MG tablet; Take 1 tablet (600 mg total) by mouth every 8 (eight) hours as needed for mild pain.  Routine general medical examination at a health care facility  Gastroesophageal reflux disease with esophagitis -     Discontinue: esomeprazole (NEXIUM) 40 MG capsule; Take 1 capsule (40 mg total) by mouth 2 (two) times daily before a meal. -     esomeprazole (NEXIUM) 40 MG capsule; Take 1 capsule (40 mg total) by mouth 2 (two) times daily before a meal.  Thoracic radiculopathy -     Discontinue: gabapentin (NEURONTIN) 100 MG capsule; One pill every eight hours as needed; dose may be increased by one pill each dose after 72 hours if only partially effective -     gabapentin (NEURONTIN) 100 MG capsule; One pill every eight hours as needed; dose may be increased by one pill each dose after 72 hours if only partially effective -     ibuprofen (ADVIL,MOTRIN) 600 MG tablet; Take 1 tablet (600 mg total) by mouth every 8 (eight) hours as needed for mild  pain.  Tendonitis, Achilles, right- he will rest and ice, will restart advil -     ibuprofen (ADVIL,MOTRIN) 600 MG tablet; Take 1 tablet (600 mg total) by mouth every 8 (eight) hours as needed for mild pain.   I have discontinued Mr. Carriker ibuprofen. I have also changed his ibuprofen. Additionally, I am having him maintain his aspirin, Glucosamine-Chondroit-Vit C-Mn (GLUCOSAMINE 1500 COMPLEX PO), Vitamin D (Ergocalciferol), Multiple Vitamins-Minerals (PRESERVISION AREDS 2 PO), rizatriptan, EPINEPHrine, colchicine, rosuvastatin, allopurinol, esomeprazole, gabapentin, and metoprolol succinate.  Meds ordered this encounter  Medications  . DISCONTD: allopurinol (ZYLOPRIM) 100 MG tablet    Sig: Take 1 tablet (100 mg total) by mouth daily.    Dispense:  90 tablet    Refill:  3  . DISCONTD: colchicine 0.6 MG tablet    Sig: Take 1 tablet (0.6 mg total) by mouth 2 (two) times daily.    Dispense:  180 tablet    Refill:  3  . DISCONTD: rosuvastatin (CRESTOR) 10 MG tablet    Sig: Take 1 tablet (10 mg total) by mouth daily.    Dispense:  90 tablet    Refill:  3  . DISCONTD: ibuprofen (ADVIL,MOTRIN) 600 MG tablet    Sig: Take 1 tablet (600 mg total) by mouth every 8 (eight) hours as needed.    Dispense:  60 tablet    Refill:  2  . DISCONTD: gabapentin (NEURONTIN) 100 MG capsule    Sig: One pill every eight hours as needed; dose may be increased by one pill each dose after 72 hours if only partially effective    Dispense:  270 capsule    Refill:  3  . DISCONTD: esomeprazole (NEXIUM) 40 MG capsule    Sig: Take 1 capsule (40 mg total) by mouth 2 (two) times daily before a meal.    Dispense:  180 capsule    Refill:  3  . colchicine 0.6 MG tablet    Sig: Take 1 tablet (0.6 mg total) by mouth 2 (two) times daily.    Dispense:  180 tablet    Refill:  3  . rosuvastatin (CRESTOR) 10 MG tablet    Sig: Take 1 tablet (10 mg total) by mouth daily.    Dispense:  90 tablet    Refill:  3  .  allopurinol (ZYLOPRIM) 100 MG tablet    Sig: Take 1 tablet (100 mg total) by mouth daily.    Dispense:  90 tablet    Refill:  3  . esomeprazole (NEXIUM) 40 MG capsule    Sig: Take 1 capsule (40 mg total) by mouth 2 (two) times daily before a meal.    Dispense:  180 capsule    Refill:  3  . gabapentin (NEURONTIN) 100 MG capsule    Sig: One pill every eight hours as needed; dose may be increased by one pill each dose after 72 hours if only partially effective    Dispense:  270 capsule    Refill:  3  . metoprolol succinate (TOPROL-XL) 50 MG 24 hr tablet    Sig: Take 1 tablet by mouth  daily with or immediately  following a meal    Dispense:  90 tablet    Refill:  3  . ibuprofen (ADVIL,MOTRIN) 600 MG tablet    Sig: Take 1 tablet (600 mg total) by mouth every 8 (eight) hours as needed for mild pain.    Dispense:  60 tablet    Refill:  2     Follow-up: Return in about 3 months (around 09/07/2015).  Scarlette Calico, MD

## 2015-06-14 ENCOUNTER — Encounter (HOSPITAL_COMMUNITY): Payer: Self-pay | Admitting: Cardiovascular Disease

## 2015-06-19 ENCOUNTER — Telehealth: Payer: Self-pay | Admitting: Internal Medicine

## 2015-06-19 ENCOUNTER — Other Ambulatory Visit: Payer: Self-pay | Admitting: Internal Medicine

## 2015-06-19 DIAGNOSIS — K2100 Gastro-esophageal reflux disease with esophagitis, without bleeding: Secondary | ICD-10-CM

## 2015-06-19 MED ORDER — ESOMEPRAZOLE MAGNESIUM 40 MG PO CPDR: 40.0000 mg | DELAYED_RELEASE_CAPSULE | Freq: Every day | ORAL | Status: DC

## 2015-06-19 NOTE — Telephone Encounter (Signed)
Done

## 2015-06-19 NOTE — Telephone Encounter (Signed)
Patient states he cannot get Nexium approved for twice daily.  Would like prescription sent for once daily x 90 days with refills.

## 2015-06-20 MED ORDER — ESOMEPRAZOLE MAGNESIUM 40 MG PO CPDR: 40.0000 mg | DELAYED_RELEASE_CAPSULE | Freq: Every day | ORAL | Status: DC

## 2015-06-20 NOTE — Telephone Encounter (Signed)
done

## 2015-06-20 NOTE — Telephone Encounter (Signed)
Patient states we sent this to [facility] and he needs it sent to [facility]. 90 day supply with refills. Still needs it written for 1 pill per day not 2.

## 2015-06-27 ENCOUNTER — Ambulatory Visit (HOSPITAL_COMMUNITY)
Admission: RE | Admit: 2015-06-27 | Discharge: 2015-06-27 | Disposition: A | Discharge status: Home / Self Care | Payer: 59 | Source: Ambulatory Visit | Attending: Cardiovascular Disease | Admitting: Cardiovascular Disease

## 2015-06-27 DIAGNOSIS — G459 Transient cerebral ischemic attack, unspecified: Secondary | ICD-10-CM

## 2015-06-27 DIAGNOSIS — E785 Hyperlipidemia, unspecified: Secondary | ICD-10-CM | POA: Diagnosis not present

## 2015-06-27 DIAGNOSIS — I10 Essential (primary) hypertension: Secondary | ICD-10-CM | POA: Insufficient documentation

## 2015-06-27 DIAGNOSIS — R42 Dizziness and giddiness: Secondary | ICD-10-CM | POA: Insufficient documentation

## 2015-06-29 ENCOUNTER — Telehealth: Payer: Self-pay

## 2015-06-29 NOTE — Telephone Encounter (Signed)
Spoke to pt about results. While on the phone with pt he stated that he has been having some chest pain that he thinks is due to the generic rx for BP. Some SOB with exacerbation and coughing at night that is keeping him.  Appt scheduled for Tuesday at 3:30 pm to discuss.

## 2015-07-04 ENCOUNTER — Encounter: Payer: Self-pay | Admitting: Internal Medicine

## 2015-07-04 ENCOUNTER — Ambulatory Visit (INDEPENDENT_AMBULATORY_CARE_PROVIDER_SITE_OTHER): Payer: 59 | Admitting: Internal Medicine

## 2015-07-04 VITALS — BP 100/70 | HR 65 | Temp 98.2°F | Resp 16 | Ht 68.0 in | Wt 210.0 lb

## 2015-07-04 DIAGNOSIS — Z23 Encounter for immunization: Secondary | ICD-10-CM

## 2015-07-04 DIAGNOSIS — I10 Essential (primary) hypertension: Secondary | ICD-10-CM | POA: Diagnosis not present

## 2015-07-04 DIAGNOSIS — R002 Palpitations: Secondary | ICD-10-CM | POA: Diagnosis not present

## 2015-07-04 MED ORDER — DILTIAZEM HCL ER 60 MG PO CP12: 60.0000 mg | ORAL_CAPSULE | Freq: Two times a day (BID) | ORAL | Status: DC

## 2015-07-04 NOTE — Patient Instructions (Signed)

## 2015-07-04 NOTE — Progress Notes (Signed)
Subjective:  Patient ID: Mark [name], male    DOB: [birth year]  Age: 55 y.o. MRN: [id number]  CC: Palpitations   [name] presents for recurrent palpitations for 3 weeks, he has a hx of PVC's that was previously evaluated with stress ECHO by a cardiologist in [place] several years ago. He has had these symptoms before, but not in recent years,  they occur at rest when he feels a brief run of an elevated heart rate with a thud sensation in his heart. The palpitations resolve with a coughing maneuver.   Outpatient Prescriptions Prior to Visit  Medication Sig Dispense Refill  . allopurinol (ZYLOPRIM) 100 MG tablet Take 1 tablet (100 mg total) by mouth daily. 90 tablet 3  . aspirin 325 MG tablet Take 325 mg by mouth daily.    . colchicine 0.6 MG tablet Take 1 tablet (0.6 mg total) by mouth 2 (two) times daily. 180 tablet 3  . EPINEPHrine (EPIPEN 2-PAK) 0.3 mg/0.3 mL IJ SOAJ injection Inject 0.3 mLs (0.3 mg total) into the muscle once. 5 Device 11  . esomeprazole (NEXIUM) 40 MG capsule Take 1 capsule (40 mg total) by mouth daily at 12 noon. 90 capsule 3  . gabapentin (NEURONTIN) 100 MG capsule One pill every eight hours as needed; dose may be increased by one pill each dose after 72 hours if only partially effective 270 capsule 3  . Glucosamine-Chondroit-Vit C-Mn (GLUCOSAMINE 1500 COMPLEX PO) Take by mouth.    Marland Kitchen ibuprofen (ADVIL,MOTRIN) 600 MG tablet Take 1 tablet (600 mg total) by mouth every 8 (eight) hours as needed for mild pain. 60 tablet 2  . metoprolol succinate (TOPROL-XL) 50 MG 24 hr tablet Take 1 tablet by mouth  daily with or immediately  following a meal 90 tablet 3  . Multiple Vitamins-Minerals (PRESERVISION AREDS 2 PO) Take by mouth.    . rizatriptan (MAXALT) 10 MG tablet Take 1 tablet (10 mg total) by mouth as needed for migraine. May repeat in 2 hours if needed 10 tablet 5  . rosuvastatin (CRESTOR) 10 MG tablet Take 1 tablet (10 mg total) by mouth daily. 90 tablet 3  .  Vitamin D, Ergocalciferol, (DRISDOL) 50000 UNITS CAPS capsule Take 50,000 Units by mouth.     No facility-administered medications prior to visit.    ROS Review of Systems  Constitutional: Positive for fatigue. Negative for fever, chills, diaphoresis and appetite change.  HENT: Negative.   Eyes: Negative.   Respiratory: Negative.  Negative for cough, choking, chest tightness, shortness of breath and stridor.   Cardiovascular: Positive for palpitations. Negative for chest pain and leg swelling.  Gastrointestinal: Negative.  Negative for nausea, vomiting, abdominal pain, diarrhea and constipation.  Endocrine: Negative.   Genitourinary: Negative.   Musculoskeletal: Negative.   Skin: Negative.   Allergic/Immunologic: Negative.   Neurological: Negative.  Negative for dizziness, tremors, syncope, light-headedness, numbness and headaches.  Hematological: Negative.  Negative for adenopathy. Does not bruise/bleed easily.  Psychiatric/Behavioral: Negative.     Objective:  BP 100/70 mmHg  Pulse 65  Temp(Src) 98.2 F (36.8 C) (Oral)  Resp 16  Ht 5\' 8"  (1.727 m)  Wt 210 lb (95.255 kg)  BMI 31.94 kg/m2  SpO2 97%  BP Readings from Last 3 Encounters:  07/04/15 100/70  06/08/15 118/80  02/27/15 118/78    Wt Readings from Last 3 Encounters:  07/04/15 210 lb (95.255 kg)  06/08/15 207 lb (93.895 kg)  02/27/15 207 lb (93.895 kg)    Physical Exam  Constitutional: He is oriented to person, place, and time. No distress.  HENT:  Mouth/Throat: Oropharynx is clear and moist. No oropharyngeal exudate.  Eyes: Conjunctivae are normal. Right eye exhibits no discharge. Left eye exhibits no discharge. No scleral icterus.  Neck: Normal range of motion. Neck supple. No JVD present. No tracheal deviation present. No thyromegaly present.  Cardiovascular: Normal rate, regular rhythm, S1 normal, S2 normal, normal heart sounds, intact distal pulses and normal pulses.  Exam reveals no gallop, no S3, no S4  and no friction rub.   No murmur heard.  No systolic murmur is present   No diastolic murmur is present  EKG today  Sinus  Rhythm  -Prominent R(V1) and left axis -nonspecific  -Seen with pulmonary disease -possible anterior fascicular block.   ABNORMAL   Pulmonary/Chest: Effort normal and breath sounds normal. No stridor. No respiratory distress. He has no wheezes. He has no rales. He exhibits no tenderness.  Abdominal: Soft. Bowel sounds are normal. He exhibits no distension and no mass. There is no tenderness. There is no rebound and no guarding.  Musculoskeletal: Normal range of motion. He exhibits no edema or tenderness.  Lymphadenopathy:    He has no cervical adenopathy.  Neurological: He is oriented to person, place, and time.  Skin: Skin is warm and dry. No rash noted. He is not diaphoretic. No erythema. No pallor.    Lab Results  Component Value Date   WBC 6.3 08/19/2014   HGB 14.3 09/14/2013   HCT 41 09/14/2013   PLT 250 09/14/2013   CHOL 139 08/19/2014   TRIG 91 08/19/2014   HDL 44 08/19/2014   LDLCALC 77 08/19/2014   ALT 11 08/19/2014   AST 17 08/19/2014   NA 143 09/14/2013   K 5.2 08/19/2014   CL 100 08/19/2014   CREATININE 1.3 09/14/2013   BUN 21 08/19/2014   CO2 24 08/19/2014   TSH 1.350 08/19/2014   PSA 0.3 08/19/2014    No results found.  Assessment & Plan:   Mark [name] was seen today for palpitations.  Diagnoses and all orders for this visit:  Essential hypertension, benign- his BP is well controlled  Palpitations- his palpitations sound benign but I am concerned about frequent PVC's, SVT, a fib so I have asked him to have a 48 hr holter monitor completed. He wil stay on the low dose beta-blocker and I will add a CCB for control of PVC's. -     EKG 12-Lead -     Holter monitor - 48 hour; Future -     diltiazem (CARDIZEM SR) 60 MG 12 hr capsule; Take 1 capsule (60 mg total) by mouth 2 (two) times daily.  Need for influenza vaccination -     Flu  Vaccine QUAD 36+ mos IM   I am having Mark [name] start on diltiazem. I am also having him maintain his aspirin, Glucosamine-Chondroit-Vit C-Mn (GLUCOSAMINE 1500 COMPLEX PO), Vitamin D (Ergocalciferol), Multiple Vitamins-Minerals (PRESERVISION AREDS 2 PO), rizatriptan, EPINEPHrine, colchicine, rosuvastatin, allopurinol, gabapentin, metoprolol succinate, ibuprofen, and esomeprazole.  Meds ordered this encounter  Medications  . diltiazem (CARDIZEM SR) 60 MG 12 hr capsule    Sig: Take 1 capsule (60 mg total) by mouth 2 (two) times daily.    Dispense:  60 capsule    Refill:  3     Follow-up: Return in about 4 weeks (around 08/01/2015).  Scarlette Calico, MD

## 2015-07-04 NOTE — Progress Notes (Signed)
Pre visit review using our clinic review tool, if applicable. No additional management support is needed unless otherwise documented below in the visit note. 

## 2015-07-21 ENCOUNTER — Encounter (HOSPITAL_COMMUNITY): Payer: Self-pay | Admitting: Nurse Practitioner

## 2015-07-21 ENCOUNTER — Telehealth: Payer: Self-pay | Admitting: Internal Medicine

## 2015-07-21 ENCOUNTER — Emergency Department (HOSPITAL_COMMUNITY)
Admission: EM | Admit: 2015-07-21 | Discharge: 2015-07-22 | Disposition: A | Discharge status: Home / Self Care | Payer: 59 | Attending: Emergency Medicine | Admitting: Emergency Medicine

## 2015-07-21 ENCOUNTER — Emergency Department (HOSPITAL_COMMUNITY): Payer: 59

## 2015-07-21 DIAGNOSIS — I493 Ventricular premature depolarization: Secondary | ICD-10-CM | POA: Insufficient documentation

## 2015-07-21 DIAGNOSIS — Z7982 Long term (current) use of aspirin: Secondary | ICD-10-CM | POA: Diagnosis not present

## 2015-07-21 DIAGNOSIS — R5383 Other fatigue: Secondary | ICD-10-CM | POA: Diagnosis present

## 2015-07-21 DIAGNOSIS — Z8672 Personal history of thrombophlebitis: Secondary | ICD-10-CM | POA: Insufficient documentation

## 2015-07-21 DIAGNOSIS — Z8619 Personal history of other infectious and parasitic diseases: Secondary | ICD-10-CM | POA: Diagnosis not present

## 2015-07-21 DIAGNOSIS — R011 Cardiac murmur, unspecified: Secondary | ICD-10-CM | POA: Diagnosis not present

## 2015-07-21 DIAGNOSIS — K219 Gastro-esophageal reflux disease without esophagitis: Secondary | ICD-10-CM | POA: Diagnosis not present

## 2015-07-21 DIAGNOSIS — E785 Hyperlipidemia, unspecified: Secondary | ICD-10-CM | POA: Insufficient documentation

## 2015-07-21 DIAGNOSIS — Z79899 Other long term (current) drug therapy: Secondary | ICD-10-CM | POA: Diagnosis not present

## 2015-07-21 DIAGNOSIS — I10 Essential (primary) hypertension: Secondary | ICD-10-CM | POA: Insufficient documentation

## 2015-07-21 DIAGNOSIS — R002 Palpitations: Secondary | ICD-10-CM

## 2015-07-21 LAB — CBC
HEMATOCRIT: 45.2 % (ref 39.0–52.0)
HEMOGLOBIN: 15.6 g/dL (ref 13.0–17.0)
MCH: 31 pg (ref 26.0–34.0)
MCHC: 34.5 g/dL (ref 30.0–36.0)
MCV: 89.7 fL (ref 78.0–100.0)
Platelets: 205 10*3/uL (ref 150–400)
RBC: 5.04 MIL/uL (ref 4.22–5.81)
RDW: 13 % (ref 11.5–15.5)
WBC: 9.2 10*3/uL (ref 4.0–10.5)

## 2015-07-21 LAB — I-STAT TROPONIN, ED
TROPONIN I, POC: 0 ng/mL (ref 0.00–0.08)
Troponin i, poc: 0 ng/mL (ref 0.00–0.08)

## 2015-07-21 LAB — BASIC METABOLIC PANEL
ANION GAP: 10 (ref 5–15)
BUN: 18 mg/dL (ref 6–20)
CO2: 24 mmol/L (ref 22–32)
Calcium: 9.3 mg/dL (ref 8.9–10.3)
Chloride: 101 mmol/L (ref 101–111)
Creatinine, Ser: 1.16 mg/dL (ref 0.61–1.24)
GLUCOSE: 101 mg/dL — AB (ref 65–99)
POTASSIUM: 4.4 mmol/L (ref 3.5–5.1)
Sodium: 135 mmol/L (ref 135–145)

## 2015-07-21 NOTE — Discharge Instructions (Signed)
Continue your Toprol and cardizem for now. Follow-up with cardiology next week.  appt is being arranged for you.  If you do not hear from the office with appt time by Monday afternoon, call to follow-up on this. Return to the ED for new or worsening symptoms.

## 2015-07-21 NOTE — ED Notes (Signed)
Patient states that he has been having more PVC's than normal recently. States that the usual methods of resolving the PVC's did not work.  States that he has been feeling across his lower chest. States that the pressure has been constant for 2 weeks. Denies nausea, vomiting, diaphoresis.  C/O having an intermittent headache.

## 2015-07-21 NOTE — ED Notes (Signed)
Pt reports increased PVCs for past few weeks, his doctor started him on cardizem 2 weeks ago for this. Then for past 2 days the PVCs have been severe and he has been dizzy and had tightness in his chest. He has been feeling SOB as well. He is A&Ox4, resp e/u
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
## 2015-07-21 NOTE — Telephone Encounter (Signed)
Patient Name: Mark [name]  DOB: 25-Dec-1960    Initial Comment Caller states he was put on a med to help pvcs, had been working, but now coming back. Now SOB.   Nurse Assessment  Nurse: Wayne Sever, RN, Tillie Rung Date/Time (Eastern Time): 07/21/2015 1:11:33 PM  Confirm and document reason for call. If symptomatic, describe symptoms. ---Caller states he saw the doctor and was put on a few weeks ago for PVC's. He states it worked for a while and now the [employer] are getting worse again for like 2 days.  Has the patient traveled out of the country within the last 30 days? ---Not Applicable  Does the patient have any new or worsening symptoms? ---Yes  Will a triage be completed? ---Yes  Related visit to physician within the last 2 weeks? ---No  Does the PT have any chronic conditions? (i.e. diabetes, asthma, etc.) ---Yes  List chronic conditions. ---Hypertension, GERD, Migraine, High Cholesterol, PVC's.     Guidelines    Guideline Title Affirmed Question Affirmed Notes  Heart Rate and Heartbeat Questions Difficulty breathing    Final Disposition User   Go to ED Now Wayne Sever, RN, [facility] - ED   Disagree/Comply: Comply

## 2015-07-21 NOTE — ED Notes (Signed)
Discharge instructions reviewed with patient. Understanding verbalized. No distress noted. Patient declined wheelchair at time of discharge.

## 2015-07-25 DIAGNOSIS — E785 Hyperlipidemia, unspecified: Secondary | ICD-10-CM | POA: Insufficient documentation

## 2015-07-25 DIAGNOSIS — Z8679 Personal history of other diseases of the circulatory system: Secondary | ICD-10-CM | POA: Insufficient documentation

## 2015-07-25 DIAGNOSIS — R011 Cardiac murmur, unspecified: Secondary | ICD-10-CM | POA: Insufficient documentation

## 2015-07-25 DIAGNOSIS — R51 Headache: Secondary | ICD-10-CM

## 2015-07-25 DIAGNOSIS — Z8672 Personal history of thrombophlebitis: Secondary | ICD-10-CM | POA: Insufficient documentation

## 2015-07-25 DIAGNOSIS — R519 Headache, unspecified: Secondary | ICD-10-CM | POA: Insufficient documentation

## 2015-07-25 DIAGNOSIS — I10 Essential (primary) hypertension: Secondary | ICD-10-CM | POA: Insufficient documentation

## 2015-07-26 ENCOUNTER — Encounter: Payer: Self-pay | Admitting: Cardiology

## 2015-07-26 ENCOUNTER — Ambulatory Visit (INDEPENDENT_AMBULATORY_CARE_PROVIDER_SITE_OTHER): Payer: 59 | Admitting: Cardiology

## 2015-07-26 VITALS — BP 122/60 | HR 67 | Ht 68.0 in | Wt 212.0 lb

## 2015-07-26 DIAGNOSIS — E785 Hyperlipidemia, unspecified: Secondary | ICD-10-CM | POA: Diagnosis not present

## 2015-07-26 DIAGNOSIS — R0789 Other chest pain: Secondary | ICD-10-CM | POA: Diagnosis not present

## 2015-07-26 DIAGNOSIS — R002 Palpitations: Secondary | ICD-10-CM | POA: Diagnosis not present

## 2015-07-26 NOTE — Patient Instructions (Addendum)
Medication Instructions:  Your physician recommends that you continue on your current medications as directed. Please refer to the Current Medication list given to you today.  Labwork: NONE  Testing/Procedures: Your physician has requested that you have an exercise tolerance test. For further information please visit [email or web address]. Please also follow instruction sheet, as given.  Follow-Up: Your physician recommends that you schedule a follow-up appointment in: 2 weeks with EP doctor    Exercise Stress Electrocardiogram An exercise stress electrocardiogram is a test that is done to evaluate the blood supply to your heart. This test may also be called exercise stress electrocardiography. The test is done while you are walking on a treadmill. The goal of this test is to raise your heart rate. This test is done to find areas of poor blood flow to the heart by determining the extent of coronary artery disease (CAD).   CAD is defined as narrowing in one or more heart (coronary) arteries of more than 70%. If you have an abnormal test result, this may mean that you are not getting adequate blood flow to your heart during exercise. Additional testing may be needed to understand why your test was abnormal. LET [facility] CARE PROVIDER KNOW ABOUT:   Any allergies you have.  All medicines you are taking, including vitamins, herbs, eye drops, creams, and over-the-counter medicines.  Previous problems you or members of your family have had with the use of anesthetics.  Any blood disorders you have.  Previous surgeries you have had.  Medical conditions you have.  Possibility of pregnancy, if this applies. RISKS AND COMPLICATIONS Generally, this is a safe procedure. However, as with any procedure, complications can occur. Possible complications can include:  Pain or pressure in the following areas:  Chest.  Jaw or neck.  Between your shoulder blades.  Radiating down your left  arm.  Dizziness or light-headedness.  Shortness of breath.  Increased or irregular heartbeats.  Nausea or vomiting.  Heart attack (rare). BEFORE THE PROCEDURE  Avoid all forms of caffeine 24 hours before your test or as directed by your health care provider. This includes coffee, tea (even decaffeinated tea), caffeinated sodas, chocolate, cocoa, and certain pain medicines.  Follow your health care provider's instructions regarding eating and drinking before the test.  Take your medicines as directed at regular times with water unless instructed otherwise. Exceptions may include:  If you have diabetes, ask how you are to take your insulin or pills. It is common to adjust insulin dosing the morning of the test.  If you are taking beta-blocker medicines, it is important to talk to your health care provider about these medicines well before the date of your test. Taking beta-blocker medicines may interfere with the test. In some cases, these medicines need to be changed or stopped 24 hours or more before the test.  If you wear a nitroglycerin patch, it may need to be removed prior to the test. Ask your health care provider if the patch should be removed before the test.  If you use an inhaler for any breathing condition, bring it with you to the test.  If you are an outpatient, bring a snack so you can eat right after the stress phase of the test.  Do not smoke for 4 hours prior to the test or as directed by your health care provider.  Do not apply lotions, powders, creams, or oils on your chest prior to the test.  Wear loose-fitting clothes and comfortable shoes for the  test. This test involves walking on a treadmill. PROCEDURE  Multiple patches (electrodes) will be put on your chest. If needed, small areas of your chest may have to be shaved to get better contact with the electrodes. Once the electrodes are attached to your body, multiple wires will be attached to the electrodes and  your heart rate will be monitored.  Your heart will be monitored both at rest and while exercising.  You will walk on a treadmill. The treadmill will be started at a slow pace. The treadmill speed and incline will gradually be increased to raise your heart rate. AFTER THE PROCEDURE  Your heart rate and blood pressure will be monitored after the test.  You may return to your normal schedule including diet, activities, and medicines, unless your health care provider tells you otherwise.   This information is not intended to replace advice given to you by your health care provider. Make sure you discuss any questions you have with your health care provider.   Document Released: 09/20/2000 Document Revised: 09/28/2013 Document Reviewed: 05/31/2013 Elsevier Interactive Patient Education [employer].
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
## 2015-07-28 ENCOUNTER — Telehealth: Payer: Self-pay | Admitting: Cardiology

## 2015-07-28 NOTE — Telephone Encounter (Signed)
ROI faxed to [employer], received letter back they have no records for dates of services asked for.  See scanned in release.

## 2015-07-31 ENCOUNTER — Ambulatory Visit (HOSPITAL_COMMUNITY)
Admission: RE | Admit: 2015-07-31 | Discharge: 2015-07-31 | Disposition: A | Discharge status: Home / Self Care | Payer: 59 | Source: Ambulatory Visit | Attending: Cardiology | Admitting: Cardiology

## 2015-07-31 DIAGNOSIS — R002 Palpitations: Secondary | ICD-10-CM | POA: Diagnosis not present

## 2015-07-31 LAB — EXERCISE TOLERANCE TEST
CHL CUP RESTING HR STRESS: 80 {beats}/min
CHL RATE OF PERCEIVED EXERTION: 13
CSEPED: 11 min
CSEPEDS: 50 s
Estimated workload: 13.6 METS
MPHR: 165 {beats}/min
Peak HR: 150 {beats}/min
Percent HR: 90 %

## 2015-08-01 NOTE — Telephone Encounter (Signed)
Pt's stress test did not show any lack of blood supply Keep appt with Dr. Curt Bears on 08/08/15.        ----- Message -----     From: Charlie Pitter, PA-C     Sent: 07/31/2015  1:55 PM      To: Isaiah Serge, NP        See notes re: PACs in recovery    Called patient with results of stress test and reminded him of his up coming appointment.

## 2015-08-07 NOTE — Progress Notes (Signed)
Electrophysiology Office Note   Date:  08/08/2015   ID:  Mark [name], DOB 1961/07/28, MRN [id number]  PCP:  Scarlette Calico, MD Primary Electrophysiologist:  Britten Parady Meredith Leeds, MD    Chief Complaint  Patient presents with  . Irregular Heart Beat     History of Present Illness: Mark [name] is a 54 y.o. male who presents today for electrophysiology evaluation.   He has a history of hypertension and hyperlipidemia.  He presents for evaluation of palpitations and chest pain.  He states he has been evaluated for potential WPW, A. fib, has worn multiple Holter monitors, all with no acute findings aside from PVCs. He states he last saw a cardiologist a proximally 4 years ago- in [place].  He states he recently saw his PCP who started him on Cardizem in addition to his normal Toprol to potentially help with his palpitations, he has not noted any change, in the past echos and stress test have been normal.   He has been having chest burning with climbing ladders that resolves with deep breathing and just walking on roof. Now progressed to chest tightness at different times. Has not awaken from sleep. He is on toprol and cardizem for palpitations. He is usually very active and while he still is with his job- he has not been in the gym for several weeks. With his chest burning he does have SOB- no nausea, or diaphoresis.   Today, he denies symptoms of shortness of breath, orthopnea, PND, lower extremity edema, claudication, dizziness, presyncope, syncope, bleeding, or neurologic sequela. He says that the symptoms occur most days.  He was doing OK with his symptoms for years, but recently has had an increase in his palpitations.  They occur when laying to sleep, as well as when he is exerting himself.  The patient is tolerating medications without difficulties and is otherwise without complaint today.    Past Medical History  Diagnosis Date  . Frequent headaches   . History of chicken pox   .  H/O cardiac arrhythmia   . Heart murmur   . Hyperlipidemia   . H/O phlebitis   . GERD (gastroesophageal reflux disease)   . Hypertension    Past Surgical History  Procedure Laterality Date  . Hernia repair       Current Outpatient Prescriptions  Medication Sig Dispense Refill  . allopurinol (ZYLOPRIM) 100 MG tablet Take 1 tablet (100 mg total) by mouth daily. 90 tablet 3  . aspirin 325 MG tablet Take 325 mg by mouth daily.    . colchicine 0.6 MG tablet Take 0.6 mg by mouth as needed (GOUT).    Marland Kitchen diltiazem (CARDIZEM SR) 60 MG 12 hr capsule Take 1 capsule (60 mg total) by mouth 2 (two) times daily. 60 capsule 3  . EPINEPHrine 0.3 mg/0.3 mL IJ SOAJ injection Inject 0.3 mg into the muscle as needed (ANAPHYLAXIS).    Marland Kitchen esomeprazole (NEXIUM) 40 MG capsule Take 1 capsule (40 mg total) by mouth daily at 12 noon. 90 capsule 3  . Glucosamine-Chondroit-Vit C-Mn (GLUCOSAMINE 1500 COMPLEX PO) Take 1 tablet by mouth daily.     Marland Kitchen ibuprofen (ADVIL,MOTRIN) 600 MG tablet Take 600 mg by mouth every 6 (six) hours as needed (GOUT).    . metoprolol succinate (TOPROL-XL) 50 MG 24 hr tablet Take 50 mg by mouth daily. Take with or immediately following a meal.    . rizatriptan (MAXALT) 10 MG tablet Take 1 tablet (10 mg total) by mouth as needed for  migraine. May repeat in 2 hours if needed 10 tablet 5  . rosuvastatin (CRESTOR) 10 MG tablet Take 1 tablet (10 mg total) by mouth daily. 90 tablet 3   No current facility-administered medications for this visit.    Allergies:   Cold buster   Social History:  The patient  reports that he has never smoked. He has never used smokeless tobacco. He reports that he does not drink alcohol or use illicit drugs.   Family History:  The patient's family history includes Alcohol abuse in his brother, father, and sister; Anemia in his paternal grandfather; Arthritis in his mother; Drug abuse in his sister; Heart attack in his brother and father; Heart disease in his father;  Hyperlipidemia in his father and mother; Hypertension in his father and mother; Lung cancer in his mother; Prostate cancer in his father; Stroke in his father.    ROS:  Please see the history of present illness.  All other systems are reviewed and negative.    PHYSICAL EXAM: VS:  BP 142/90 mmHg  Pulse 70  Ht 5\' 8"  (1.727 m)  Wt 215 lb (97.523 kg)  BMI 32.70 kg/m2 , BMI Body mass index is 32.7 kg/(m^2). GEN: Well nourished, well developed, in no acute distress HEENT: normal Neck: no JVD, carotid bruits, or masses Cardiac: RRR; no murmurs, rubs, or gallops,no edema  Respiratory:  clear to auscultation bilaterally, normal work of breathing GI: soft, nontender, nondistended, + BS MS: no deformity or atrophy Skin: warm and dry Neuro:  Strength and sensation are intact Psych: euthymic mood, full affect  EKG:  EKG is ordered today. The ekg ordered today shows sinus rhythm, rate 70  Recent Labs: 08/19/2014: ALT 11; TSH 1.350 07/21/2015: BUN 18; Creatinine, Ser 1.16; Hemoglobin 15.6; Platelets 205; Potassium 4.4; Sodium 135    Lipid Panel     Component Value Date/Time   CHOL 139 08/19/2014   CHOL 140 09/14/2013   TRIG 91 08/19/2014   TRIG 106 09/14/2013   HDL 44 08/19/2014   CHOLHDL 3.2 08/19/2014   LDLCALC 77 08/19/2014   LDLCALC 77 09/14/2013     Wt Readings from Last 3 Encounters:  08/08/15 215 lb (97.523 kg)  07/26/15 212 lb (96.163 kg)  07/04/15 210 lb (95.255 kg)      Other studies Reviewed: Additional studies/ records that were reviewed today include: ETT 07/31/15  Review of the above records today demonstrates:  Negative stress treadmill for ischemia. In recovery, patient had frequent PACs with brief atrial bigeminy, along with rare ectopic atrial beat - the patient said this felt just like the symptoms of palpitations he has been having recently. Ectopy resolved towards the end of 5 minutes of recovery. No SVT, atrial fib, or PVCs seen.   ASSESSMENT AND  PLAN:  1. Chest tightness: negative stress test with only PACs.  Reports normal TTE in the past.  Possibly due to palpitations.  2. Palpitations: had APCs on his ETT.  Unsure how often his is having APCs at this point and if this is the cause of his symptoms.  At this time, he would prefer medical management.  Mark [name] get echo to determine his EF as well as a 48 hour monitor as his symptoms have changed.    3. Hyperlipidemia: on crestor. Continue.  Current medicines are reviewed at length with the patient today.   The patient does not have concerns regarding his medicines.  The following changes were made today:  none  Labs/ tests ordered today  include: TTE, Holter  Orders Placed This Encounter  Procedures  . Holter monitor - 48 hour  . EKG 12-Lead  . Echocardiogram    Disposition:   FU with Lachanda Buczek 3 months  Signed, Naela Nodal Meredith Leeds, MD  08/08/2015 9:11 AM     [facility] [address] [place] [address] [phone] (office) [phone] (fax)

## 2015-08-08 ENCOUNTER — Encounter: Payer: Self-pay | Admitting: Cardiology

## 2015-08-08 ENCOUNTER — Ambulatory Visit (INDEPENDENT_AMBULATORY_CARE_PROVIDER_SITE_OTHER): Payer: 59 | Admitting: Cardiology

## 2015-08-08 VITALS — BP 142/90 | HR 70 | Ht 68.0 in | Wt 215.0 lb

## 2015-08-08 DIAGNOSIS — R002 Palpitations: Secondary | ICD-10-CM

## 2015-08-08 DIAGNOSIS — I493 Ventricular premature depolarization: Secondary | ICD-10-CM | POA: Diagnosis not present

## 2015-08-08 NOTE — Patient Instructions (Signed)
Medication Instructions:  Your physician recommends that you continue on your current medications as directed. Please refer to the Current Medication list given to you today.  Labwork: None ordered  Testing/Procedures: Your physician has recommended that you wear a holter monitor. Holter monitors are medical devices that record the heart's electrical activity. Doctors most often use these monitors to diagnose arrhythmias. Arrhythmias are problems with the speed or rhythm of the heartbeat. The monitor is a small, portable device. You can wear one while you do your normal daily activities. This is usually used to diagnose what is causing palpitations/syncope (passing out).  Your physician has requested that you have an echocardiogram. Echocardiography is a painless test that uses sound waves to create images of your heart. It provides your doctor with information about the size and shape of your heart and how well your heart's chambers and valves are working. This procedure takes approximately one hour. There are no restrictions for this procedure.   Follow-Up: Your physician wants you to follow-up in: 3 months with Dr. Curt Bears. You will receive a reminder letter in the mail two months in advance. If you don't receive a letter, please call our office to schedule the follow-up appointment.   Any Other Special Instructions Will Be Listed Below (If Applicable).  If you need a refill on your cardiac medications before your next appointment, please call your pharmacy.  Thank you for choosing [facility]!!   Trinidad Curet, RN [phone]

## 2015-08-15 ENCOUNTER — Telehealth: Payer: Self-pay | Admitting: *Deleted

## 2015-08-15 NOTE — Telephone Encounter (Signed)
Called patient and asked him to fill out a new medical release form for [employer]. He is agreeable to fill this out Friday when he comes in for echo and holter monitor.

## 2015-08-18 ENCOUNTER — Ambulatory Visit (INDEPENDENT_AMBULATORY_CARE_PROVIDER_SITE_OTHER): Payer: 59

## 2015-08-18 ENCOUNTER — Other Ambulatory Visit: Payer: Self-pay

## 2015-08-18 ENCOUNTER — Ambulatory Visit (HOSPITAL_COMMUNITY): Payer: 59 | Attending: Cardiology

## 2015-08-18 DIAGNOSIS — I517 Cardiomegaly: Secondary | ICD-10-CM | POA: Insufficient documentation

## 2015-08-18 DIAGNOSIS — Z8249 Family history of ischemic heart disease and other diseases of the circulatory system: Secondary | ICD-10-CM | POA: Insufficient documentation

## 2015-08-18 DIAGNOSIS — I340 Nonrheumatic mitral (valve) insufficiency: Secondary | ICD-10-CM | POA: Diagnosis not present

## 2015-08-18 DIAGNOSIS — R002 Palpitations: Secondary | ICD-10-CM | POA: Diagnosis present

## 2015-08-18 DIAGNOSIS — E785 Hyperlipidemia, unspecified: Secondary | ICD-10-CM | POA: Insufficient documentation

## 2015-08-18 DIAGNOSIS — I493 Ventricular premature depolarization: Secondary | ICD-10-CM

## 2015-08-18 DIAGNOSIS — I10 Essential (primary) hypertension: Secondary | ICD-10-CM | POA: Insufficient documentation

## 2015-08-18 DIAGNOSIS — I071 Rheumatic tricuspid insufficiency: Secondary | ICD-10-CM | POA: Insufficient documentation

## 2015-08-21 ENCOUNTER — Encounter: Payer: Self-pay | Admitting: Cardiology

## 2015-08-24 ENCOUNTER — Other Ambulatory Visit: Payer: Self-pay | Admitting: Cardiology

## 2015-09-05 ENCOUNTER — Encounter: Payer: Self-pay | Admitting: Cardiology

## 2015-09-05 NOTE — Telephone Encounter (Signed)
New problem ° ° °Pt returning your call. °

## 2015-09-05 NOTE — Telephone Encounter (Signed)
This encounter was created in error - please disregard.

## 2015-10-06 ENCOUNTER — Telehealth: Payer: Self-pay | Admitting: Cardiology

## 2015-10-06 NOTE — Telephone Encounter (Signed)
Records received via mail from [employer] placed in [place] bin

## 2015-10-10 ENCOUNTER — Telehealth: Payer: Self-pay

## 2015-10-10 NOTE — Telephone Encounter (Signed)
GAVE NOTES TO [facility] PRICE RN

## 2015-11-06 ENCOUNTER — Encounter: Payer: Self-pay | Admitting: *Deleted

## 2015-11-06 DIAGNOSIS — I82622 Acute embolism and thrombosis of deep veins of left upper extremity: Secondary | ICD-10-CM | POA: Insufficient documentation

## 2015-11-06 DIAGNOSIS — I471 Supraventricular tachycardia, unspecified: Secondary | ICD-10-CM | POA: Insufficient documentation

## 2015-11-09 ENCOUNTER — Ambulatory Visit: Payer: 59 | Admitting: Cardiology

## 2015-12-10 NOTE — Progress Notes (Signed)
Electrophysiology Office Note   Date:  12/11/2015   ID:  Mark [name], DOB May 21, 1961, MRN [id number]  PCP:  Scarlette Calico, MD  Primary Electrophysiologist:  Genelda Roark Meredith Leeds, MD    Chief Complaint  Patient presents with  . Follow-up     History of Present Illness: Mark [name] is a 55 y.o. male who presents today for electrophysiology evaluation.   He has a history of hypertension and hyperlipidemia. He presents for evaluation of palpitations and chest pain. He states he has been evaluated for potential WPW, A. fib, has worn multiple Holter monitors, all with no acute findings aside from PVCs. He returns to clinic today feeling well.he says he is having no further palpitations. He is able to exercise without any issues. He was initially prescribed diltiazem, but he is been out of that for the last 3 weeks and has noticed no further symptoms.   Today, he denies symptoms of palpitations, chest pain, shortness of breath, orthopnea, PND, lower extremity edema, claudication, dizziness, presyncope, syncope, bleeding, or neuologic sequela. The patient is tolerating medications without difficulties and is otherwise without complaint today.    Past Medical History  Diagnosis Date  . Frequent headaches   . History of chicken pox   . H/O cardiac arrhythmia   . Heart murmur   . Hyperlipidemia   . H/O phlebitis   . GERD (gastroesophageal reflux disease)   . Hypertension    Past Surgical History  Procedure Laterality Date  . Hernia repair    . Left elbow radial collateral ligament repair  09/2012     Current Outpatient Prescriptions  Medication Sig Dispense Refill  . allopurinol (ZYLOPRIM) 100 MG tablet Take 1 tablet (100 mg total) by mouth daily. 90 tablet 3  . aspirin 325 MG tablet Take 325 mg by mouth daily.    . colchicine 0.6 MG tablet Take 0.6 mg by mouth as needed (GOUT).    Marland Kitchen diltiazem (CARDIZEM SR) 60 MG 12 hr capsule Take 1 capsule (60 mg total) by mouth 2 (two) times  daily. 60 capsule 3  . EPINEPHrine 0.3 mg/0.3 mL IJ SOAJ injection Inject 0.3 mg into the muscle as needed (ANAPHYLAXIS).    Marland Kitchen esomeprazole (NEXIUM) 40 MG capsule Take 1 capsule (40 mg total) by mouth daily at 12 noon. 90 capsule 3  . Glucosamine-Chondroit-Vit C-Mn (GLUCOSAMINE 1500 COMPLEX PO) Take 1 tablet by mouth daily.     Marland Kitchen ibuprofen (ADVIL,MOTRIN) 600 MG tablet Take 600 mg by mouth every 6 (six) hours as needed (GOUT).    . metoprolol succinate (TOPROL-XL) 50 MG 24 hr tablet Take 50 mg by mouth daily. Take with or immediately following a meal.    . rizatriptan (MAXALT) 10 MG tablet Take 1 tablet (10 mg total) by mouth as needed for migraine. May repeat in 2 hours if needed 10 tablet 5  . rosuvastatin (CRESTOR) 10 MG tablet Take 1 tablet (10 mg total) by mouth daily. 90 tablet 3   No current facility-administered medications for this visit.    Allergies:   Cold buster   Social History:  The patient  reports that he has never smoked. He has never used smokeless tobacco. He reports that he does not drink alcohol or use illicit drugs.   Family History:  The patient's family history includes Alcohol abuse in his brother, father, and sister; Anemia in his paternal grandfather; Arthritis in his mother; Drug abuse in his sister; Heart attack in his brother and father; Heart disease  in his father; Hyperlipidemia in his father and mother; Hypertension in his father and mother; Lung cancer in his mother; Prostate cancer in his father; Stroke in his father.    ROS:  Please see the history of present illness.   Otherwise, review of systems is positive for short palpitations.   All other systems are reviewed and negative.    PHYSICAL EXAM: VS:  Ht 5\' 8"  (1.727 m)  Wt 208 lb (94.348 kg)  BMI 31.63 kg/m2 , BMI Body mass index is 31.63 kg/(m^2). GEN: Well nourished, well developed, in no acute distress HEENT: normal Neck: no JVD, carotid bruits, or masses Cardiac: RRR; no murmurs, rubs, or  gallops,no edema  Respiratory:  clear to auscultation bilaterally, normal work of breathing GI: soft, nontender, nondistended, + BS MS: no deformity or atrophy Skin: warm and dry Neuro:  Strength and sensation are intact Psych: euthymic mood, full affect  EKG:  EKG is ordered today. The ekg ordered today shows sinus rhythm, rate 63  Recent Labs: 07/21/2015: BUN 18; Creatinine, Ser 1.16; Hemoglobin 15.6; Platelets 205; Potassium 4.4; Sodium 135    Lipid Panel     Component Value Date/Time   CHOL 139 08/19/2014   CHOL 140 09/14/2013   TRIG 91 08/19/2014   TRIG 106 09/14/2013   HDL 44 08/19/2014   CHOLHDL 3.2 08/19/2014   LDLCALC 77 08/19/2014   LDLCALC 77 09/14/2013     Wt Readings from Last 3 Encounters:  12/11/15 208 lb (94.348 kg)  08/08/15 215 lb (97.523 kg)  07/26/15 212 lb (96.163 kg)      Other studies Reviewed: Additional studies/ records that were reviewed today include: TTE, Holter 08/18/15  Review of the above records today demonstrates:  - Left ventricle: Global longituindal LV strain is normal at -18.2% The cavity size was normal. There was moderate concentric hypertrophy. Systolic function was normal. The estimated ejection fraction was in the range of 60% to 65%. Wall motion was normal; there were no regional wall motion abnormalities. - Mitral valve: There was trivial regurgitation. - Tricuspid valve: There was trivial regurgitation.  Average HR: 67 bpm Minimum HR 49 bpm Maximum HR: 108 bpm Ventricular beats: 5 Supraventricular beats: 29 Primary rhythm: sinus rhythm, no arrhythmia seen   ASSESSMENT AND PLAN:  1. Palpitations: no evidence of dangerous arrhythmia on his monitor. He has not had any further palpitations since his episode a few months ago. At this time we Anaiza Behrens not treat him for his history of PVCs, as he has been asymptomatic. He Bentlee Benningfield call us back as any further questions or needs any further assistance. We Clerence Gubser stop his  diltiazem as he has not taken in 3 weeks, has not had any palpitations, and has a normal blood pressure.  2. Hyperlipidemia: continue Crestor  Current medicines are reviewed at length with the patient today.   The patient does not have concerns regarding his medicines.  The following changes were made today:  Stop diltiazem  Labs/ tests ordered today include:  No orders of the defined types were placed in this encounter.     Disposition:   FU with Sofya Moustafa PRN  Signed, Nyzaiah Kai Meredith Leeds, MD  12/11/2015 10:39 AM     [facility] [address] [place] [address] [phone] (office) [phone] (fax)

## 2015-12-11 ENCOUNTER — Ambulatory Visit (INDEPENDENT_AMBULATORY_CARE_PROVIDER_SITE_OTHER): Payer: 59 | Admitting: Cardiology

## 2015-12-11 ENCOUNTER — Encounter: Payer: Self-pay | Admitting: Cardiology

## 2015-12-11 VITALS — BP 130/78 | HR 63 | Ht 68.0 in | Wt 208.0 lb

## 2015-12-11 DIAGNOSIS — R002 Palpitations: Secondary | ICD-10-CM

## 2016-04-04 ENCOUNTER — Other Ambulatory Visit: Payer: Self-pay | Admitting: Internal Medicine

## 2016-04-16 ENCOUNTER — Other Ambulatory Visit: Payer: Self-pay | Admitting: Internal Medicine

## 2016-04-16 DIAGNOSIS — M1000 Idiopathic gout, unspecified site: Secondary | ICD-10-CM

## 2016-04-16 MED ORDER — ALLOPURINOL 100 MG PO TABS: 100.0000 mg | ORAL_TABLET | Freq: Every day | ORAL | Status: DC

## 2016-04-16 NOTE — Telephone Encounter (Signed)
Patient is calling to get a 90 day fill for allopurinol (ZYLOPRIM) 100 MG tablet [id number] sent to [facility]

## 2016-04-16 NOTE — Telephone Encounter (Signed)
[facility] sent.  Pt needs an appt for additional refills.

## 2016-06-09 ENCOUNTER — Other Ambulatory Visit: Payer: Self-pay | Admitting: Internal Medicine

## 2016-06-09 DIAGNOSIS — K2100 Gastro-esophageal reflux disease with esophagitis, without bleeding: Secondary | ICD-10-CM

## 2016-06-09 DIAGNOSIS — E785 Hyperlipidemia, unspecified: Secondary | ICD-10-CM

## 2016-06-09 DIAGNOSIS — M1000 Idiopathic gout, unspecified site: Secondary | ICD-10-CM

## 2016-06-09 DIAGNOSIS — I10 Essential (primary) hypertension: Secondary | ICD-10-CM

## 2016-07-02 ENCOUNTER — Encounter: Payer: Self-pay | Admitting: Internal Medicine

## 2016-07-02 ENCOUNTER — Other Ambulatory Visit (INDEPENDENT_AMBULATORY_CARE_PROVIDER_SITE_OTHER): Payer: 59

## 2016-07-02 ENCOUNTER — Ambulatory Visit (INDEPENDENT_AMBULATORY_CARE_PROVIDER_SITE_OTHER): Payer: 59 | Admitting: Internal Medicine

## 2016-07-02 VITALS — BP 122/80 | HR 69 | Temp 98.5°F | Resp 16 | Ht 68.0 in | Wt 216.2 lb

## 2016-07-02 DIAGNOSIS — Z0000 Encounter for general adult medical examination without abnormal findings: Secondary | ICD-10-CM

## 2016-07-02 DIAGNOSIS — Z23 Encounter for immunization: Secondary | ICD-10-CM | POA: Diagnosis not present

## 2016-07-02 DIAGNOSIS — K219 Gastro-esophageal reflux disease without esophagitis: Secondary | ICD-10-CM | POA: Diagnosis not present

## 2016-07-02 DIAGNOSIS — I471 Supraventricular tachycardia: Secondary | ICD-10-CM | POA: Diagnosis not present

## 2016-07-02 DIAGNOSIS — I10 Essential (primary) hypertension: Secondary | ICD-10-CM

## 2016-07-02 DIAGNOSIS — M1000 Idiopathic gout, unspecified site: Secondary | ICD-10-CM

## 2016-07-02 DIAGNOSIS — K2100 Gastro-esophageal reflux disease with esophagitis, without bleeding: Secondary | ICD-10-CM

## 2016-07-02 DIAGNOSIS — R002 Palpitations: Secondary | ICD-10-CM

## 2016-07-02 DIAGNOSIS — E785 Hyperlipidemia, unspecified: Secondary | ICD-10-CM

## 2016-07-02 LAB — CBC WITH DIFFERENTIAL/PLATELET
BASOS PCT: 0.2 % (ref 0.0–3.0)
Basophils Absolute: 0 10*3/uL (ref 0.0–0.1)
EOS ABS: 0.1 10*3/uL (ref 0.0–0.7)
EOS PCT: 0.8 % (ref 0.0–5.0)
HEMATOCRIT: 42.7 % (ref 39.0–52.0)
HEMOGLOBIN: 14.6 g/dL (ref 13.0–17.0)
LYMPHS PCT: 23.6 % (ref 12.0–46.0)
Lymphs Abs: 1.7 10*3/uL (ref 0.7–4.0)
MCHC: 34.2 g/dL (ref 30.0–36.0)
MCV: 88.6 fl (ref 78.0–100.0)
MONOS PCT: 7.5 % (ref 3.0–12.0)
Monocytes Absolute: 0.5 10*3/uL (ref 0.1–1.0)
Neutro Abs: 5 10*3/uL (ref 1.4–7.7)
Neutrophils Relative %: 67.9 % (ref 43.0–77.0)
Platelets: 250 10*3/uL (ref 150.0–400.0)
RBC: 4.82 Mil/uL (ref 4.22–5.81)
RDW: 12.5 % (ref 11.5–15.5)
WBC: 7.3 10*3/uL (ref 4.0–10.5)

## 2016-07-02 LAB — COMPREHENSIVE METABOLIC PANEL
ALBUMIN: 4.4 g/dL (ref 3.5–5.2)
ALT: 15 U/L (ref 0–53)
AST: 20 U/L (ref 0–37)
Alkaline Phosphatase: 59 U/L (ref 39–117)
BUN: 23 mg/dL (ref 6–23)
CALCIUM: 9.5 mg/dL (ref 8.4–10.5)
CHLORIDE: 104 meq/L (ref 96–112)
CO2: 32 mEq/L (ref 19–32)
Creatinine, Ser: 1.19 mg/dL (ref 0.40–1.50)
GFR: 67.15 mL/min (ref >60.00)
Glucose, Bld: 101 mg/dL — ABNORMAL HIGH (ref 70–99)
POTASSIUM: 5.2 meq/L — AB (ref 3.5–5.1)
Sodium: 141 mEq/L (ref 135–145)
Total Bilirubin: 0.7 mg/dL (ref 0.2–1.2)
Total Protein: 6.6 g/dL (ref 6.0–8.3)

## 2016-07-02 LAB — TSH: TSH: 1.1 u[IU]/mL (ref 0.35–4.50)

## 2016-07-02 LAB — LIPID PANEL
CHOL/HDL RATIO: 3
CHOLESTEROL: 145 mg/dL (ref 0–200)
HDL: 42.3 mg/dL (ref >39.00)
LDL CALC: 74 mg/dL (ref 0–99)
NonHDL: 102.41
TRIGLYCERIDES: 142 mg/dL (ref 0.0–149.0)
VLDL: 28.4 mg/dL (ref 0.0–40.0)

## 2016-07-02 LAB — URIC ACID: Uric Acid, Serum: 5.6 mg/dL (ref 4.0–7.8)

## 2016-07-02 LAB — PSA: PSA: 0.33 ng/mL (ref 0.10–4.00)

## 2016-07-02 MED ORDER — ALLOPURINOL 300 MG PO TABS: 300.0000 mg | ORAL_TABLET | Freq: Every day | ORAL | 3 refills | Status: DC

## 2016-07-02 MED ORDER — METOPROLOL SUCCINATE ER 50 MG PO TB24
ORAL_TABLET | ORAL | 3 refills | Status: DC
Start: 2016-07-02 — End: 2017-07-03

## 2016-07-02 MED ORDER — ROSUVASTATIN CALCIUM 10 MG PO TABS: 10.0000 mg | ORAL_TABLET | Freq: Every day | ORAL | 3 refills | Status: DC

## 2016-07-02 MED ORDER — ESOMEPRAZOLE MAGNESIUM 40 MG PO CPDR: DELAYED_RELEASE_CAPSULE | ORAL | 3 refills | Status: DC

## 2016-07-02 MED ORDER — DILTIAZEM HCL ER 60 MG PO CP12: 60.0000 mg | ORAL_CAPSULE | Freq: Two times a day (BID) | ORAL | 3 refills | Status: DC

## 2016-07-02 NOTE — Progress Notes (Signed)
Pre visit review using our clinic review tool, if applicable. No additional management support is needed unless otherwise documented below in the visit note. 

## 2016-07-02 NOTE — Patient Instructions (Signed)

## 2016-07-02 NOTE — Progress Notes (Signed)
Subjective:  Patient ID: Mark [name], male    DOB: Dec 04, 1960  Age: 55 y.o. MRN: [id number]  CC: Annual Exam   [name] presents for a CPX - He has had a few episodes of gout in his knees recently and wants to increase his dose of allopurinol. He offers no other complaints.  Outpatient Medications Prior to Visit  Medication Sig Dispense Refill  . aspirin 325 MG tablet Take 325 mg by mouth daily.    Marland Kitchen esomeprazole (NEXIUM) 40 MG capsule Take 1 capsule by mouth  daily at 12 noon 90 capsule 1  . Glucosamine-Chondroit-Vit C-Mn (GLUCOSAMINE 1500 COMPLEX PO) Take 1 tablet by mouth daily.     . rizatriptan (MAXALT) 10 MG tablet Take 1 tablet (10 mg total) by mouth as needed for migraine. May repeat in 2 hours if needed 10 tablet 5  . allopurinol (ZYLOPRIM) 100 MG tablet TAKE 1 TABLET BY MOUTH  DAILY. CPE NEEDED FOR ANY  REFILLS. 90 tablet 1  . colchicine 0.6 MG tablet Take 0.6 mg by mouth as needed (GOUT).    Marland Kitchen diltiazem (CARDIZEM SR) 60 MG 12 hr capsule Take 1 capsule (60 mg total) by mouth 2 (two) times daily. 60 capsule 3  . ibuprofen (ADVIL,MOTRIN) 600 MG tablet Take 600 mg by mouth every 6 (six) hours as needed (GOUT).    . metoprolol succinate (TOPROL-XL) 50 MG 24 hr tablet Take 50 mg by mouth daily. Take with or immediately following a meal.    . metoprolol succinate (TOPROL-XL) 50 MG 24 hr tablet Take 1 tablet by mouth  daily with or immediately  following a meal 90 tablet 1  . rosuvastatin (CRESTOR) 10 MG tablet Take 1 tablet by mouth  daily 90 tablet 1  . EPINEPHrine 0.3 mg/0.3 mL IJ SOAJ injection Inject 0.3 mg into the muscle as needed (ANAPHYLAXIS).     No facility-administered medications prior to visit.     ROS Review of Systems  Constitutional: Negative.  Negative for appetite change, chills, diaphoresis, fatigue and unexpected weight change.  HENT: Negative.  Negative for sinus pressure and trouble swallowing.   Eyes: Negative.  Negative for visual disturbance.    Respiratory: Negative.  Negative for cough, choking, chest tightness, shortness of breath, wheezing and stridor.   Cardiovascular: Negative.  Negative for chest pain, palpitations and leg swelling.  Gastrointestinal: Negative.  Negative for abdominal pain, constipation, diarrhea, nausea and vomiting.  Endocrine: Negative.   Genitourinary: Negative.  Negative for difficulty urinating, dysuria, flank pain, frequency, hematuria, penile pain, testicular pain and urgency.  Musculoskeletal: Negative.  Negative for arthralgias, back pain and myalgias.  Skin: Negative.  Negative for rash.  Allergic/Immunologic: Negative.   Neurological: Negative.   Hematological: Negative.  Negative for adenopathy. Does not bruise/bleed easily.  Psychiatric/Behavioral: Negative.     Objective:  BP 122/80 (BP Location: Left Arm, Patient Position: Sitting, Cuff Size: Normal)   Pulse 69   Temp 98.5 F (36.9 C) (Oral)   Resp 16   Ht 5\' 8"  (1.727 m)   Wt 216 lb 4 oz (98.1 kg)   SpO2 95%   BMI 32.88 kg/m   BP Readings from Last 3 Encounters:  07/02/16 122/80  12/11/15 130/78  08/08/15 (!) 142/90    Wt Readings from Last 3 Encounters:  07/02/16 216 lb 4 oz (98.1 kg)  12/11/15 208 lb (94.3 kg)  08/08/15 215 lb (97.5 kg)    Physical Exam  Constitutional: He is oriented to person, place,  and time. No distress.  HENT:  Mouth/Throat: Oropharynx is clear and moist. No oropharyngeal exudate.  Eyes: Conjunctivae are normal. Right eye exhibits no discharge. Left eye exhibits no discharge. No scleral icterus.  Neck: Normal range of motion. Neck supple. No JVD present. No tracheal deviation present. No thyromegaly present.  Cardiovascular: Normal rate, regular rhythm, normal heart sounds and intact distal pulses.  Exam reveals no gallop and no friction rub.   No murmur heard. Pulmonary/Chest: Effort normal and breath sounds normal. No stridor. No respiratory distress. He has no wheezes. He has no rales. He  exhibits no tenderness.  Abdominal: Soft. Bowel sounds are normal. He exhibits no distension and no mass. There is no tenderness. There is no rebound and no guarding. Hernia confirmed negative in the right inguinal area and confirmed negative in the left inguinal area.  Genitourinary: Rectum normal, prostate normal, testes normal and penis normal. Rectal exam shows no external hemorrhoid, no internal hemorrhoid, no fissure, no mass, no tenderness, anal tone normal and guaiac negative stool. Prostate is not enlarged and not tender. Right testis shows no mass, no swelling and no tenderness. Right testis is descended. Left testis shows no mass, no swelling and no tenderness. Left testis is descended. Circumcised. No penile erythema or penile tenderness. No discharge found.  Musculoskeletal: Normal range of motion. He exhibits no edema, tenderness or deformity.  Lymphadenopathy:    He has no cervical adenopathy.       Right: No inguinal adenopathy present.       Left: No inguinal adenopathy present.  Neurological: He is oriented to person, place, and time.  Skin: Skin is warm and dry. No rash noted. He is not diaphoretic. No erythema. No pallor.  Psychiatric: He has a normal mood and affect. His behavior is normal. Judgment and thought content normal.  Vitals reviewed.   Lab Results  Component Value Date   WBC 7.3 07/02/2016   HGB 14.6 07/02/2016   HCT 42.7 07/02/2016   PLT 250.0 07/02/2016   GLUCOSE 101 (H) 07/02/2016   CHOL 145 07/02/2016   TRIG 142.0 07/02/2016   HDL 42.30 07/02/2016   LDLCALC 74 07/02/2016   ALT 15 07/02/2016   AST 20 07/02/2016   NA 141 07/02/2016   K 5.2 (H) 07/02/2016   CL 104 07/02/2016   CREATININE 1.19 07/02/2016   BUN 23 07/02/2016   CO2 32 07/02/2016   TSH 1.10 07/02/2016   PSA 0.33 07/02/2016    No results found.  Assessment & Plan:   Mark [name] was seen today for annual exam.  Diagnoses and all orders for this visit:  Routine general medical  examination at a health care facility- Exam completed, labs ordered and reviewed, his colonoscopy is up-to-date, vaccines reviewed and updated, patient education material was given. -     Lipid panel; Future -     Comprehensive metabolic panel; Future -     CBC with Differential/Platelet; Future -     PSA; Future -     TSH; Future -     HIV antibody; Future  Idiopathic gout, unspecified chronicity, unspecified site- allopurinol dose increased as requested -     Uric acid; Future -     allopurinol (ZYLOPRIM) 300 MG tablet; Take 1 tablet (300 mg total) by mouth daily.  Need for prophylactic vaccination and inoculation against influenza -     Flu Vaccine QUAD 36+ mos IM  Essential hypertension, benign- his blood pressure is adequately well controlled -  metoprolol succinate (TOPROL-XL) 50 MG 24 hr tablet; Take 1 tablet by mouth  daily with or immediately  following a meal -     diltiazem (CARDIZEM SR) 60 MG 12 hr capsule; Take 1 capsule (60 mg total) by mouth 2 (two) times daily.  SVT (supraventricular tachycardia) ([place])- he has had no recent episodes of palpitations or any other cardiovascular symptoms, this is well-controlled with a combination of a beta blocker and calcium channel blocker. -     metoprolol succinate (TOPROL-XL) 50 MG 24 hr tablet; Take 1 tablet by mouth  daily with or immediately  following a meal -     diltiazem (CARDIZEM SR) 60 MG 12 hr capsule; Take 1 capsule (60 mg total) by mouth 2 (two) times daily.  Gastroesophageal reflux disease without esophagitis- his symptoms are well controlled with a PPI, will continue.  Essential hypertension  Hyperlipidemia with target LDL less than 100- he has achieved his LDL goal is doing well on the statin. -     rosuvastatin (CRESTOR) 10 MG tablet; Take 1 tablet (10 mg total) by mouth daily.  Palpitations   I have discontinued Mr. Jakob colchicine, ibuprofen, and allopurinol. I have also changed his rosuvastatin.  Additionally, I am having him start on allopurinol. Lastly, I am having him maintain his aspirin, Glucosamine-Chondroit-Vit C-Mn (GLUCOSAMINE 1500 COMPLEX PO), rizatriptan, EPINEPHrine, esomeprazole, metoprolol succinate, and diltiazem.  Meds ordered this encounter  Medications  . allopurinol (ZYLOPRIM) 300 MG tablet    Sig: Take 1 tablet (300 mg total) by mouth daily.    Dispense:  90 tablet    Refill:  3  . metoprolol succinate (TOPROL-XL) 50 MG 24 hr tablet    Sig: Take 1 tablet by mouth  daily with or immediately  following a meal    Dispense:  90 tablet    Refill:  3  . diltiazem (CARDIZEM SR) 60 MG 12 hr capsule    Sig: Take 1 capsule (60 mg total) by mouth 2 (two) times daily.    Dispense:  60 capsule    Refill:  3  . rosuvastatin (CRESTOR) 10 MG tablet    Sig: Take 1 tablet (10 mg total) by mouth daily.    Dispense:  90 tablet    Refill:  3     Follow-up: Return in about 6 months (around 12/30/2016).  Scarlette Calico, MD

## 2016-07-03 ENCOUNTER — Encounter: Payer: Self-pay | Admitting: Internal Medicine

## 2016-07-03 LAB — HIV ANTIBODY (ROUTINE TESTING W REFLEX): HIV 1&2 Ab, 4th Generation: NONREACTIVE

## 2016-08-20 ENCOUNTER — Other Ambulatory Visit: Payer: Self-pay | Admitting: Internal Medicine

## 2016-08-20 DIAGNOSIS — I471 Supraventricular tachycardia: Secondary | ICD-10-CM

## 2016-08-20 DIAGNOSIS — I10 Essential (primary) hypertension: Secondary | ICD-10-CM

## 2017-03-28 DIAGNOSIS — M7732 Calcaneal spur, left foot: Secondary | ICD-10-CM | POA: Diagnosis not present

## 2017-03-28 DIAGNOSIS — M71571 Other bursitis, not elsewhere classified, right ankle and foot: Secondary | ICD-10-CM | POA: Diagnosis not present

## 2017-03-28 DIAGNOSIS — M7731 Calcaneal spur, right foot: Secondary | ICD-10-CM | POA: Diagnosis not present

## 2017-03-28 DIAGNOSIS — M71572 Other bursitis, not elsewhere classified, left ankle and foot: Secondary | ICD-10-CM | POA: Diagnosis not present

## 2017-03-28 DIAGNOSIS — M7661 Achilles tendinitis, right leg: Secondary | ICD-10-CM | POA: Diagnosis not present

## 2017-04-04 DIAGNOSIS — M7662 Achilles tendinitis, left leg: Secondary | ICD-10-CM | POA: Diagnosis not present

## 2017-04-04 DIAGNOSIS — M71571 Other bursitis, not elsewhere classified, right ankle and foot: Secondary | ICD-10-CM | POA: Diagnosis not present

## 2017-04-04 DIAGNOSIS — M7661 Achilles tendinitis, right leg: Secondary | ICD-10-CM | POA: Diagnosis not present

## 2017-06-03 LAB — HEMOGLOBIN A1C: Hemoglobin A1C: 5.9

## 2017-06-26 ENCOUNTER — Telehealth: Payer: Self-pay | Admitting: Internal Medicine

## 2017-06-26 DIAGNOSIS — M1000 Idiopathic gout, unspecified site: Secondary | ICD-10-CM

## 2017-06-26 MED ORDER — ALLOPURINOL 300 MG PO TABS: 300.0000 mg | ORAL_TABLET | Freq: Every day | ORAL | 0 refills | Status: DC

## 2017-06-26 NOTE — Telephone Encounter (Signed)
Per office policy can send 30 day to local pharmacy until appt. Them MD will send 90 to mail service. [facility].../lb

## 2017-06-26 NOTE — Telephone Encounter (Signed)
Pt called for a refill of his allopurinol (ZYLOPRIM) 300 MG tablet Last seen 06/2016 Appt made for 07/03/2017 Please advise  Send to [facility]

## 2017-07-03 ENCOUNTER — Encounter: Payer: Self-pay | Admitting: Internal Medicine

## 2017-07-03 ENCOUNTER — Ambulatory Visit (INDEPENDENT_AMBULATORY_CARE_PROVIDER_SITE_OTHER): Payer: 59 | Admitting: Internal Medicine

## 2017-07-03 VITALS — BP 118/80 | HR 63 | Temp 98.7°F | Resp 16 | Ht 68.0 in | Wt 209.0 lb

## 2017-07-03 DIAGNOSIS — R51 Headache: Secondary | ICD-10-CM

## 2017-07-03 DIAGNOSIS — E785 Hyperlipidemia, unspecified: Secondary | ICD-10-CM

## 2017-07-03 DIAGNOSIS — I10 Essential (primary) hypertension: Secondary | ICD-10-CM | POA: Diagnosis not present

## 2017-07-03 DIAGNOSIS — K219 Gastro-esophageal reflux disease without esophagitis: Secondary | ICD-10-CM

## 2017-07-03 DIAGNOSIS — I471 Supraventricular tachycardia: Secondary | ICD-10-CM | POA: Diagnosis not present

## 2017-07-03 DIAGNOSIS — Z0000 Encounter for general adult medical examination without abnormal findings: Secondary | ICD-10-CM | POA: Diagnosis not present

## 2017-07-03 DIAGNOSIS — R519 Headache, unspecified: Secondary | ICD-10-CM

## 2017-07-03 DIAGNOSIS — Z23 Encounter for immunization: Secondary | ICD-10-CM | POA: Diagnosis not present

## 2017-07-03 MED ORDER — METOPROLOL SUCCINATE ER 50 MG PO TB24: ORAL_TABLET | ORAL | 3 refills | Status: DC

## 2017-07-03 MED ORDER — ROSUVASTATIN CALCIUM 10 MG PO TABS: 10.0000 mg | ORAL_TABLET | Freq: Every day | ORAL | 3 refills | Status: DC

## 2017-07-03 MED ORDER — ESOMEPRAZOLE MAGNESIUM 40 MG PO CPDR: DELAYED_RELEASE_CAPSULE | ORAL | 3 refills | Status: DC

## 2017-07-03 NOTE — Progress Notes (Signed)
Subjective:  Patient ID: Mark [name], male    DOB: 05/20/1961  Age: 56 y.o. MRN: [id number]  CC: Annual Exam; Hypertension; and Hyperlipidemia   HPI [name] presents for a CPX.  He feels well and offers no complaints. He exercises and has had no recent episodes of CP, palpitations, SOB, DOE, edema, or fatigue. He has decided to stop taking the calcium channel blocker.  Outpatient Medications Prior to Visit  Medication Sig Dispense Refill  . allopurinol (ZYLOPRIM) 300 MG tablet Take 1 tablet (300 mg total) by mouth daily. Must keep 07/03/17 appt for future refills 30 tablet 0  . EPINEPHrine 0.3 mg/0.3 mL IJ SOAJ injection Inject 0.3 mg into the muscle as needed (ANAPHYLAXIS).    . Glucosamine-Chondroit-Vit C-Mn (GLUCOSAMINE 1500 COMPLEX PO) Take 1 tablet by mouth daily.     . rizatriptan (MAXALT) 10 MG tablet Take 1 tablet (10 mg total) by mouth as needed for migraine. May repeat in 2 hours if needed 10 tablet 5  . aspirin 325 MG tablet Take 325 mg by mouth daily.    Marland Kitchen diltiazem (CARDIZEM SR) 60 MG 12 hr capsule TAKE 1 CAPSULE BY MOUTH TWO TIMES DAILY 180 capsule 1  . esomeprazole (NEXIUM) 40 MG capsule Take 1 capsule by mouth  daily at 12 noon 90 capsule 3  . metoprolol succinate (TOPROL-XL) 50 MG 24 hr tablet Take 1 tablet by mouth  daily with or immediately  following a meal 90 tablet 3  . rosuvastatin (CRESTOR) 10 MG tablet Take 1 tablet (10 mg total) by mouth daily. 90 tablet 3   No facility-administered medications prior to visit.     ROS Review of Systems  Constitutional: Negative.  Negative for appetite change, diaphoresis, fatigue and unexpected weight change.  HENT: Negative.  Negative for sinus pressure and trouble swallowing.   Eyes: Negative for visual disturbance.  Respiratory: Negative for cough, chest tightness, shortness of breath and wheezing.   Cardiovascular: Negative for chest pain, palpitations and leg swelling.  Gastrointestinal: Negative for abdominal  pain, blood in stool, constipation, diarrhea, nausea and vomiting.  Endocrine: Negative.   Genitourinary: Negative.  Negative for decreased urine volume, difficulty urinating, dysuria, frequency, hematuria and urgency.  Musculoskeletal: Negative.  Negative for arthralgias and myalgias.  Skin: Negative.   Allergic/Immunologic: Negative.   Neurological: Negative.  Negative for dizziness and syncope.  Hematological: Negative for adenopathy. Does not bruise/bleed easily.  Psychiatric/Behavioral: Negative.     Objective:  BP 118/80 (BP Location: Left Arm, Patient Position: Sitting, Cuff Size: Large)   Pulse 63   Temp 98.7 F (37.1 C) (Oral)   Ht '5\' 8"'  (1.727 m)   Wt 209 lb (94.8 kg)   SpO2 98%   BMI 31.78 kg/m   BP Readings from Last 3 Encounters:  07/03/17 118/80  07/02/16 122/80  12/11/15 130/78    Wt Readings from Last 3 Encounters:  07/03/17 209 lb (94.8 kg)  07/02/16 216 lb 4 oz (98.1 kg)  12/11/15 208 lb (94.3 kg)    Physical Exam  Constitutional: He is oriented to person, place, and time. No distress.  HENT:  Mouth/Throat: Oropharynx is clear and moist. No oropharyngeal exudate.  Eyes: Conjunctivae are normal. Right eye exhibits no discharge. Left eye exhibits no discharge. No scleral icterus.  Neck: Normal range of motion. Neck supple. No JVD present. No thyromegaly present.  Cardiovascular: Normal rate, regular rhythm and intact distal pulses.  Exam reveals no gallop and no friction rub.   No murmur  heard. Pulmonary/Chest: Effort normal and breath sounds normal. No respiratory distress. He has no wheezes. He has no rales. He exhibits no tenderness.  Abdominal: Soft. Bowel sounds are normal. He exhibits no distension and no mass. There is no tenderness. There is no rebound and no guarding. Hernia confirmed negative in the right inguinal area and confirmed negative in the left inguinal area.  Genitourinary: Rectum normal, prostate normal, testes normal and penis normal.  Rectal exam shows no external hemorrhoid, no internal hemorrhoid, no fissure, no mass, no tenderness, anal tone normal and guaiac negative stool. Prostate is not enlarged and not tender. Right testis shows no mass, no swelling and no tenderness. Right testis is descended. Left testis shows no mass, no swelling and no tenderness. Left testis is descended. Circumcised. No penile erythema or penile tenderness. No discharge found.  Musculoskeletal: Normal range of motion. He exhibits no edema, tenderness or deformity.  Lymphadenopathy:    He has no cervical adenopathy.       Right: No inguinal adenopathy present.       Left: No inguinal adenopathy present.  Neurological: He is alert and oriented to person, place, and time.  Skin: Skin is warm and dry. No rash noted. He is not diaphoretic. No erythema. No pallor.  Psychiatric: He has a normal mood and affect. His behavior is normal. Judgment and thought content normal.  Vitals reviewed.   Lab Results  Component Value Date   WBC 7.3 07/02/2016   HGB 14.6 07/02/2016   HCT 42.7 07/02/2016   PLT 250.0 07/02/2016   GLUCOSE 101 (H) 07/02/2016   CHOL 145 07/02/2016   TRIG 142.0 07/02/2016   HDL 42.30 07/02/2016   LDLCALC 74 07/02/2016   ALT 15 07/02/2016   AST 20 07/02/2016   NA 141 07/02/2016   K 5.2 (H) 07/02/2016   CL 104 07/02/2016   CREATININE 1.19 07/02/2016   BUN 23 07/02/2016   CO2 32 07/02/2016   TSH 1.10 07/02/2016   PSA 0.33 07/02/2016   HGBA1C 5.9 06/03/2017    No results found.  Assessment & Plan:   Chadwin was seen today for annual exam, hypertension and hyperlipidemia.  Diagnoses and all orders for this visit:  Essential hypertension, benign- his blood pressure is adequately well controlled -     metoprolol succinate (TOPROL-XL) 50 MG 24 hr tablet; Take 1 tablet by mouth  daily with or immediately  following a meal -     Comp Met (CMET); Future  SVT (supraventricular tachycardia) ([place])- he stopped taking the CCB and  has had no recurrences of SVT. He is doing well on the beta blocker with no recent palpitations. Will continue the current dose. -     metoprolol succinate (TOPROL-XL) 50 MG 24 hr tablet; Take 1 tablet by mouth  daily with or immediately  following a meal  Gastroesophageal reflux disease without esophagitis- his symptoms are well-controlled with a PPI. -     esomeprazole (NEXIUM) 40 MG capsule; Take 1 capsule by mouth  daily at 12 noon  Routine general medical examination at a health care facility- exam completed, labs reviewed, vaccines reviewed and updated, colon cancer screening is up-to-date, patient education material was given. -     PSA; Future  Hyperlipidemia with target LDL less than 100- he is doing well on the statin. Will monitor his LDL to be certain that he has achieved his LDL goal. -     rosuvastatin (CRESTOR) 10 MG tablet; Take 1 tablet (10 mg  total) by mouth daily. -     Lipid Profile; Future  Frequent headaches  Need for influenza vaccination -     Flu Vaccine QUAD 36+ mos IM   I have discontinued Mr. Corp aspirin and diltiazem. I am also having him maintain his Glucosamine-Chondroit-Vit C-Mn (GLUCOSAMINE 1500 COMPLEX PO), rizatriptan, EPINEPHrine, allopurinol, esomeprazole, rosuvastatin, and metoprolol succinate.  Meds ordered this encounter  Medications  . esomeprazole (NEXIUM) 40 MG capsule    Sig: Take 1 capsule by mouth  daily at 12 noon    Dispense:  90 capsule    Refill:  3  . rosuvastatin (CRESTOR) 10 MG tablet    Sig: Take 1 tablet (10 mg total) by mouth daily.    Dispense:  90 tablet    Refill:  3  . metoprolol succinate (TOPROL-XL) 50 MG 24 hr tablet    Sig: Take 1 tablet by mouth  daily with or immediately  following a meal    Dispense:  90 tablet    Refill:  3     Follow-up: Return in about 1 year (around 07/03/2018).  Scarlette Calico, MD

## 2017-07-03 NOTE — Patient Instructions (Signed)

## 2017-07-04 ENCOUNTER — Encounter: Payer: Self-pay | Admitting: Internal Medicine

## 2017-07-08 ENCOUNTER — Telehealth: Payer: Self-pay | Admitting: *Deleted

## 2017-07-08 DIAGNOSIS — E785 Hyperlipidemia, unspecified: Secondary | ICD-10-CM

## 2017-07-08 DIAGNOSIS — I471 Supraventricular tachycardia: Secondary | ICD-10-CM

## 2017-07-08 DIAGNOSIS — M1000 Idiopathic gout, unspecified site: Secondary | ICD-10-CM

## 2017-07-08 DIAGNOSIS — I10 Essential (primary) hypertension: Secondary | ICD-10-CM

## 2017-07-08 DIAGNOSIS — K219 Gastro-esophageal reflux disease without esophagitis: Secondary | ICD-10-CM

## 2017-07-08 MED ORDER — METOPROLOL SUCCINATE ER 50 MG PO TB24: ORAL_TABLET | ORAL | 3 refills | Status: DC

## 2017-07-08 MED ORDER — ESOMEPRAZOLE MAGNESIUM 40 MG PO CPDR: DELAYED_RELEASE_CAPSULE | ORAL | 3 refills | Status: DC

## 2017-07-08 MED ORDER — ROSUVASTATIN CALCIUM 10 MG PO TABS: 10.0000 mg | ORAL_TABLET | Freq: Every day | ORAL | 3 refills | Status: DC

## 2017-07-08 MED ORDER — ALLOPURINOL 300 MG PO TABS: 300.0000 mg | ORAL_TABLET | Freq: Every day | ORAL | 3 refills | Status: DC

## 2017-07-08 NOTE — Telephone Encounter (Signed)
Rec'd call pt states he saw MD couple days ago asked for refills to be sent mail order, but [facility]. Requesting meds to be resent to [facility], Inform pt resending as we speak...Mark [name]

## 2017-07-10 ENCOUNTER — Other Ambulatory Visit: Payer: Self-pay | Admitting: Internal Medicine

## 2017-07-10 DIAGNOSIS — E785 Hyperlipidemia, unspecified: Secondary | ICD-10-CM | POA: Diagnosis not present

## 2017-07-10 DIAGNOSIS — I10 Essential (primary) hypertension: Secondary | ICD-10-CM | POA: Diagnosis not present

## 2017-07-10 DIAGNOSIS — Z0000 Encounter for general adult medical examination without abnormal findings: Secondary | ICD-10-CM | POA: Diagnosis not present

## 2017-07-10 LAB — BASIC METABOLIC PANEL
BUN: 15 (ref 4–21)
Creatinine: 1.2 (ref <=1.3)
GLUCOSE: 107
Potassium: 4.8 (ref 3.4–5.3)
SODIUM: 140 (ref 137–147)

## 2017-07-10 LAB — HEPATIC FUNCTION PANEL
ALT: 12 (ref 10–40)
AST: 20 (ref 14–40)
Alkaline Phosphatase: 66 (ref 25–125)
BILIRUBIN, TOTAL: 0.7

## 2017-07-10 LAB — LIPID PANEL
Cholesterol: 130 (ref 0–200)
HDL: 41 (ref 35–70)
LDL Cholesterol: 69
TRIGLYCERIDES: 98 (ref 40–160)

## 2017-07-11 LAB — COMPREHENSIVE METABOLIC PANEL
ALK PHOS: 66 IU/L (ref 39–117)
ALT: 12 IU/L (ref 0–44)
AST: 20 IU/L (ref 0–40)
Albumin/Globulin Ratio: 2.7 — ABNORMAL HIGH (ref 1.2–2.2)
Albumin: 4.8 g/dL (ref 3.5–5.5)
BUN / CREAT RATIO: 15 (ref 9–20)
BUN: 18 mg/dL (ref 6–24)
Bilirubin Total: 0.7 mg/dL (ref 0.0–1.2)
CALCIUM: 9.3 mg/dL (ref 8.7–10.2)
CHLORIDE: 102 mmol/L (ref 96–106)
CO2: 24 mmol/L (ref 20–29)
CREATININE: 1.17 mg/dL (ref 0.76–1.27)
GFR, EST AFRICAN AMERICAN: 80 mL/min/{1.73_m2} (ref >59)
GFR, EST NON AFRICAN AMERICAN: 69 mL/min/{1.73_m2} (ref >59)
GLUCOSE: 107 mg/dL — AB (ref 65–99)
Globulin, Total: 1.8 g/dL (ref 1.5–4.5)
Potassium: 4.8 mmol/L (ref 3.5–5.2)
SODIUM: 140 mmol/L (ref 134–144)
Total Protein: 6.6 g/dL (ref 6.0–8.5)

## 2017-07-11 LAB — LIPID PANEL
CHOL/HDL RATIO: 3.2 ratio (ref 0.0–5.0)
Cholesterol, Total: 130 mg/dL (ref 100–199)
HDL: 41 mg/dL (ref >39)
LDL Calculated: 69 mg/dL (ref 0–99)
Triglycerides: 98 mg/dL (ref 0–149)
VLDL Cholesterol Cal: 20 mg/dL (ref 5–40)

## 2017-07-11 LAB — PSA: PROSTATE SPECIFIC AG, SERUM: 0.3 ng/mL (ref 0.0–4.0)

## 2017-07-16 ENCOUNTER — Encounter: Payer: Self-pay | Admitting: Internal Medicine

## 2017-07-17 ENCOUNTER — Encounter: Payer: Self-pay | Admitting: Internal Medicine

## 2017-08-01 ENCOUNTER — Telehealth: Payer: Self-pay | Admitting: Internal Medicine

## 2017-08-01 ENCOUNTER — Other Ambulatory Visit: Payer: Self-pay | Admitting: Internal Medicine

## 2017-08-01 DIAGNOSIS — K219 Gastro-esophageal reflux disease without esophagitis: Secondary | ICD-10-CM

## 2017-08-01 MED ORDER — PANTOPRAZOLE SODIUM 40 MG PO TBEC: 40.0000 mg | DELAYED_RELEASE_TABLET | Freq: Every day | ORAL | 1 refills | Status: DC

## 2017-08-01 NOTE — Telephone Encounter (Signed)
Pt called and his insurance will no longer cover his esomeprazole (NEXIUM) 40 MG capsule He would like a similar alternative sent into [facility].

## 2017-08-01 NOTE — Telephone Encounter (Signed)
Pt informed new rx has been sent.  

## 2017-08-01 NOTE — Telephone Encounter (Signed)
New RX sent

## 2017-11-05 ENCOUNTER — Ambulatory Visit: Payer: Self-pay | Admitting: *Deleted

## 2017-11-05 ENCOUNTER — Encounter: Payer: Self-pay | Admitting: Internal Medicine

## 2017-11-05 ENCOUNTER — Ambulatory Visit: Payer: Managed Care, Other (non HMO) | Admitting: Internal Medicine

## 2017-11-05 VITALS — BP 126/80 | HR 93 | Temp 97.9°F | Ht 68.0 in | Wt 221.0 lb

## 2017-11-05 DIAGNOSIS — H6121 Impacted cerumen, right ear: Secondary | ICD-10-CM | POA: Diagnosis not present

## 2017-11-05 DIAGNOSIS — K1120 Sialoadenitis, unspecified: Secondary | ICD-10-CM

## 2017-11-05 DIAGNOSIS — M19042 Primary osteoarthritis, left hand: Secondary | ICD-10-CM

## 2017-11-05 MED ORDER — AMOXICILLIN-POT CLAVULANATE 875-125 MG PO TABS: 1.0000 | ORAL_TABLET | Freq: Two times a day (BID) | ORAL | 0 refills | Status: AC

## 2017-11-05 MED ORDER — DICLOFENAC SODIUM 1 % TD GEL
2.0000 g | Freq: Four times a day (QID) | TRANSDERMAL | 5 refills | Status: DC
Start: 2017-11-05 — End: 2019-02-15

## 2017-11-05 NOTE — Telephone Encounter (Signed)
Woke up this morning with the left side of his face under his left ear and under his jaw swollen the size of a golf ball.  The inside of his mouth also is very sore. I made an appt with Dr. Ronnald Ramp for today at 10:15. I instructed him to call us back or go to the ED if his symptoms become worse.  Reason for Disposition . [1] Swelling is red AND [2] very painful to touch  Answer Assessment - Initial Assessment Questions 1. ONSET: "When did the swelling start?" (e.g., minutes, hours, days)     I woke up with the swelling on the left side of my face under my left ear and under my jawbone.  The left side of my face is swollen and very tender if I press on it.  No earache.   I've just gotten over a real bad cold.  I took 2 rounds of antibiotics to get over this last cold.   I have a beard so it's hard to tell if I was bitten or not. 2. LOCATION: "What part of the face is swollen?"     See above 3. SEVERITY: "How swollen is it?"     It's swollen out like a golf ball size.     I was sweating this morning in my truck and it's very cold outside.   I don't feel terrible. 4. ITCHING: "Is there any itching?" If so, ask: "How much?"   (Scale 1-10; mild, moderate or severe)     No itching.   I do have redness 5. PAIN: "Is the swelling painful to touch?" If so, ask: "How painful is it?"   (Scale 1-10; mild, moderate or severe)     It's very painful to touch. 6. FEVER: "Do you have a fever?" If so, ask: "What is it, how was it measured, and when did it start?"      I was sweating in my truck this morning and it's very cold outside. 7. CAUSE: "What do you think is causing the face swelling?"     Maybe I was bitten or a lymph node is swollen. 8. RECURRENT SYMPTOM: "Have you had face swelling before?" If so, ask: "When was the last time?" "What happened that time?"     No 9. OTHER SYMPTOMS: "Do you have any other symptoms?" (e.g., toothache, leg swelling)     My teeth are not hurting.  But the inside of my check  on the left burns 10. PREGNANCY: "Is there any chance you are pregnant?" "When was your last menstrual period?"       N/A  Protocols used: [facility]

## 2017-11-05 NOTE — Progress Notes (Signed)
Subjective:  Patient ID: Mark [name], male    DOB: 07/15/1961  Age: 57 y.o. MRN: [id number]  CC: URI   [name] presents for a 2-week history of URI symptoms.  He has been seen at an urgent care center twice and has been prescribed amoxicillin and levofloxacin.  He thought he was getting better until over the last 24 hours when he developed pain and swelling in front of his left ear with chills and redness.  He denies fever, headache, nausea, vomiting, sore throat, earache, or lymphadenopathy.  He is taking ibuprofen to relieve the pain.  Outpatient Medications Prior to Visit  Medication Sig Dispense Refill  . allopurinol (ZYLOPRIM) 300 MG tablet Take 1 tablet (300 mg total) by mouth daily. 90 tablet 3  . EPINEPHrine 0.3 mg/0.3 mL IJ SOAJ injection Inject 0.3 mg into the muscle as needed (ANAPHYLAXIS).    . Glucosamine-Chondroit-Vit C-Mn (GLUCOSAMINE 1500 COMPLEX PO) Take 1 tablet by mouth daily.     . metoprolol succinate (TOPROL-XL) 50 MG 24 hr tablet Take 1 tablet by mouth  daily with or immediately  following a meal 90 tablet 3  . pantoprazole (PROTONIX) 40 MG tablet Take 1 tablet (40 mg total) by mouth daily. 90 tablet 1  . rizatriptan (MAXALT) 10 MG tablet Take 1 tablet (10 mg total) by mouth as needed for migraine. May repeat in 2 hours if needed 10 tablet 5  . rosuvastatin (CRESTOR) 10 MG tablet Take 1 tablet (10 mg total) by mouth daily. 90 tablet 3   No facility-administered medications prior to visit.     ROS Review of Systems  Constitutional: Positive for chills. Negative for fatigue and fever.  HENT: Positive for ear pain, facial swelling and hearing loss. Negative for ear discharge, sinus pressure, sinus pain, sore throat and trouble swallowing.        He complains of loss of hearing and muffled sensation in his right ear.  Eyes: Negative.   Respiratory: Negative for cough, chest tightness and shortness of breath.   Cardiovascular: Negative for chest pain,  palpitations and leg swelling.  Gastrointestinal: Negative for abdominal pain, diarrhea, nausea and vomiting.  Endocrine: Negative.   Genitourinary: Negative.   Musculoskeletal: Positive for arthralgias. Negative for back pain, neck pain and neck stiffness.       He complains of chronic pain in the knuckles on the dorsum of his left hand.  Skin: Positive for color change. Negative for rash.  Allergic/Immunologic: Negative.   Neurological: Negative.  Negative for dizziness, weakness and light-headedness.  Hematological: Negative for adenopathy. Does not bruise/bleed easily.  Psychiatric/Behavioral: Negative.     Objective:  BP 126/80 (BP Location: Left Arm, Patient Position: Sitting, Cuff Size: Large)   Pulse 93   Temp 97.9 F (36.6 C) (Oral)   Ht 5\' 8"  (1.727 m)   Wt 221 lb (100.2 kg)   SpO2 97%   BMI 33.60 kg/m   BP Readings from Last 3 Encounters:  11/05/17 126/80  07/03/17 118/80  07/02/16 122/80    Wt Readings from Last 3 Encounters:  11/05/17 221 lb (100.2 kg)  07/03/17 209 lb (94.8 kg)  07/02/16 216 lb 4 oz (98.1 kg)    Physical Exam  Constitutional: He is oriented to person, place, and time.  Non-toxic appearance. He does not have a sickly appearance. He does not appear ill. No distress.  HENT:  Head:    Right Ear: Hearing, tympanic membrane, external ear and ear canal normal.  Left  Ear: Hearing, tympanic membrane, external ear and ear canal normal.  Mouth/Throat: Oropharynx is clear and moist and mucous membranes are normal. No oral lesions. No uvula swelling. No oropharyngeal exudate or posterior oropharyngeal erythema.  Right EAC has a cerumen impaction.  I put Colace in the right EAC and then I irrigated it with water and used an ear pick to remove the wax.  He tolerated this well.  Afterwards, the examination of the EAC and TM are normal.  Eyes: Conjunctivae are normal. Left eye exhibits no discharge. No scleral icterus.  Neck: Normal range of motion. Neck  supple. No JVD present. No thyromegaly present.  Cardiovascular: Normal rate, regular rhythm and normal heart sounds. Exam reveals no gallop.  No murmur heard. Pulmonary/Chest: Effort normal and breath sounds normal. No respiratory distress. He has no wheezes. He has no rales.  Abdominal: Soft. Bowel sounds are normal. He exhibits no distension and no mass. There is no tenderness.  Musculoskeletal: Normal range of motion. He exhibits no edema or tenderness.       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 He exhibits normal range of motion, no tenderness, no bony tenderness and normal capillary refill. Normal sensation noted. Normal strength noted.  Lymphadenopathy:       Head (right side): No submental, no submandibular, no tonsillar, no preauricular, no posterior auricular and no occipital adenopathy present.       Head (left side): No submental, no submandibular, no tonsillar, no preauricular, no posterior auricular and no occipital adenopathy present.    He has no cervical adenopathy.       Right cervical: No superficial cervical adenopathy present.      Left cervical: No superficial cervical adenopathy present.    He has no axillary adenopathy.  Neurological: He is alert and oriented to person, place, and time.  Skin: Skin is warm and dry. No rash noted. He is not diaphoretic. There is erythema. No pallor.  Vitals reviewed.   Lab Results  Component Value Date   WBC 7.3 07/02/2016   HGB 14.6 07/02/2016   HCT 42.7 07/02/2016   PLT 250.0 07/02/2016   GLUCOSE 107 (H) 07/10/2017   CHOL 130 07/10/2017   TRIG 98 07/10/2017   HDL 41 07/10/2017   LDLCALC 69 07/10/2017   ALT 12 07/10/2017   AST 20 07/10/2017   NA 140 07/10/2017   K 4.8 07/10/2017   CL 102 07/10/2017   CREATININE 1.17 07/10/2017   BUN 18 07/10/2017   CO2 24 07/10/2017   TSH 1.10 07/02/2016   PSA 0.33 07/02/2016   HGBA1C 5.9 06/03/2017    No results found.  Assessment & Plan:   Mark [name] was seen today  for uri.  Diagnoses and all orders for this visit:  Hearing loss due to cerumen impaction, right- Symptoms resolved and examination is normal after removal of the wax.  Parotiditis- I will treat the infection with Augmentin.  He was given patient education about treating parotid gland infections.  He is getting relief from the pain with ibuprofen. -     amoxicillin-clavulanate (AUGMENTIN) 875-125 MG tablet; Take 1 tablet by mouth 2 (two) times daily for 10 days.  Osteoarthritis, hand, primary localized, left -     diclofenac sodium (VOLTAREN) 1 % GEL; Apply 2 g topically 4 (four) times daily.   I am having Mark [name] start on diclofenac sodium and amoxicillin-clavulanate. I am also having him maintain his Glucosamine-Chondroit-Vit C-Mn (GLUCOSAMINE 1500 COMPLEX PO), rizatriptan, EPINEPHrine, rosuvastatin,  metoprolol succinate, allopurinol, and pantoprazole.  Meds ordered this encounter  Medications  . diclofenac sodium (VOLTAREN) 1 % GEL    Sig: Apply 2 g topically 4 (four) times daily.    Dispense:  100 g    Refill:  5  . amoxicillin-clavulanate (AUGMENTIN) 875-125 MG tablet    Sig: Take 1 tablet by mouth 2 (two) times daily for 10 days.    Dispense:  20 tablet    Refill:  0     Follow-up: Return in about 3 weeks (around 11/26/2017).  Scarlette Calico, MD

## 2017-11-05 NOTE — Patient Instructions (Signed)
Parotitis Parotitis is irritation and swelling (inflammation) of one or both of your parotid glands. These glands produce saliva. They are found on each side of your face, below and in front of your earlobes. The saliva that they produce comes out of tiny openings (ducts) inside your cheeks. Parotitis may cause sudden swelling and pain (acute parotitis). It can also cause repeated episodes of swelling and pain or continued swelling that may or may not be painful (chronic parotitis). What are the causes? Causes of this condition include:  Bacterial infections.  Viral infections, such as mumps and HIV.  Blockage (obstruction) of saliva flow through the parotid glands. This can be from a stone, scar tissue, or tumor.  Diseases that cause your body's defense system to attack salivary glands and cause inflammation (autoimmune diseases).  What increases the risk? This condition is more likely to develop in:  People who are 50 or older.  People who do not drink enough fluids (dehydration).  People who drink too much alcohol.  People who have a dry mouth.  People who have poor dental hygiene.  People who have diabetes.  People who have gout.  People who have a long-term illness.  People who have had X-ray treatments to the head and neck.  People who take certain medicines.  What are the signs or symptoms? Symptoms of this condition depend on the cause. Symptoms may include:  Swelling under and in front of the ear. This may get worse after eating.  Redness of the skin over the parotid gland.  Pain and tenderness over the parotid gland. This may get worse after eating.  Fever or chills.  Pus coming from the ducts inside the mouth.  Dry mouth.  A bad taste in the mouth.  How is this diagnosed? This condition is diagnosed with a medical history and physical exam. You may also have tests to find the cause of parotitis. These tests may include:  Doing blood tests to check  for autoimmune disease or a viral infection.  Taking a sample of fluid from the parotid gland to test for infection.  Injecting the ducts of the gland with a dye before taking X-rays (sialogram).  Having other imaging studies of the gland, including X-rays, ultrasound, MRI, or CT scan.  Checking the opening of the gland for a stone or obstruction.  Placing a needle into the gland to remove tissue for a biopsy (fine needle aspiration).  How is this treated? Treatment for this condition depends on the cause. Treatment may include:  Antibiotic medicine for a bacterial infection.  Drinking more fluids.  Removing a stone or obstruction.  Treating an underlying disease that is causing parotitis.  Surgery to drain an infection, remove a growth, or remove the whole gland (parotidectomy).  Treatment may not be needed if parotid swelling goes away with home care. Follow these instructions at home: Medicines  Take over-the-counter and prescription medicines only as told by your health care provider.  If you were prescribed an antibiotic, take it as told by your health care provider. Do not stop taking the antibiotic even if you start to feel better. Managing pain and swelling  Apply warm compresses to the affected area as told by your health care provider.  Gently massage the parotid glands as told by your health care provider. General instructions   Drink enough fluid to keep your urine clear or pale yellow.  Try sucking on sour candy. This may help to make your mouth less dry and   may stimulate the flow of saliva.  Keep your mouth clean and moist. Gargle with a salt-water mixture 3-4 times per day, or as needed. To make a salt-water mixture, completely dissolve -1 tsp of salt in 1 cup of warm water.  Maintain good oral health. ? Brush your teeth at least two times per day. ? Floss your teeth every day. ? See your dentist regularly.  Do not use tobacco products, including  cigarettes, chewing tobacco, or e-cigarettes. If you need help quitting, ask your health care provider.  Keep all follow-up visits as told by your health care provider. This is important. Contact a health care provider if:  You have a fever or chills.  You have new symptoms.  Your symptoms get worse.  Your symptoms do not improve with treatment. This information is not intended to replace advice given to you by your health care provider. Make sure you discuss any questions you have with your health care provider. Document Released: 03/15/2002 Document Revised: 02/29/2016 Document Reviewed: 02/16/2015 Elsevier Interactive Patient Education  2018 [employer] Inc[employer]  

## 2017-12-27 ENCOUNTER — Other Ambulatory Visit: Payer: Self-pay | Admitting: Internal Medicine

## 2017-12-27 DIAGNOSIS — K219 Gastro-esophageal reflux disease without esophagitis: Secondary | ICD-10-CM

## 2018-01-09 ENCOUNTER — Telehealth: Payer: Self-pay | Admitting: Internal Medicine

## 2018-01-09 NOTE — Telephone Encounter (Signed)
Copied from [place] [id number]. Topic: Quick Communication - Rx Refill/Question >> Jan 09, 2018 12:30 PM Yvette Rack wrote: Medication: pantoprazole (PROTONIX) 40 MG tablet Has the patient contacted their pharmacy? Yes.   (Agent: If no, request that the patient contact the pharmacy for the refill.) Preferred Pharmacy (with phone number or street name): [place], [place] [phone] (Phone) [phone] (Fax)  Agent: Please be advised that RX refills may take up to 3 business days. We ask that you follow-up with your pharmacy.

## 2018-01-09 NOTE — Telephone Encounter (Signed)
Protonix was refilled 12/27/17; #90 with RF x1.  Attempted to call pt.  Left voice message to check with his pharmacy, as this med. has been refilled.  Advised to call back if questions.

## 2018-02-18 ENCOUNTER — Other Ambulatory Visit: Payer: Managed Care, Other (non HMO)

## 2018-02-18 ENCOUNTER — Encounter: Payer: Self-pay | Admitting: Internal Medicine

## 2018-02-18 ENCOUNTER — Ambulatory Visit (INDEPENDENT_AMBULATORY_CARE_PROVIDER_SITE_OTHER)
Admission: RE | Admit: 2018-02-18 | Discharge: 2018-02-18 | Disposition: A | Discharge status: Home / Self Care | Payer: Managed Care, Other (non HMO) | Source: Ambulatory Visit | Attending: Internal Medicine | Admitting: Internal Medicine

## 2018-02-18 ENCOUNTER — Ambulatory Visit: Payer: Managed Care, Other (non HMO) | Admitting: Internal Medicine

## 2018-02-18 ENCOUNTER — Encounter: Payer: Self-pay | Admitting: Emergency Medicine

## 2018-02-18 VITALS — BP 110/72 | HR 75 | Temp 98.5°F | Resp 16 | Wt 218.0 lb

## 2018-02-18 DIAGNOSIS — J209 Acute bronchitis, unspecified: Secondary | ICD-10-CM | POA: Diagnosis not present

## 2018-02-18 DIAGNOSIS — R05 Cough: Secondary | ICD-10-CM

## 2018-02-18 DIAGNOSIS — R059 Cough, unspecified: Secondary | ICD-10-CM

## 2018-02-18 MED ORDER — AZITHROMYCIN 250 MG PO TABS: ORAL_TABLET | ORAL | 0 refills | Status: DC

## 2018-02-18 NOTE — Progress Notes (Signed)
Subjective:    Patient ID: Mark [name], male    DOB: [birth year], 57 y.o.   MRN: [id number]  HPI He is here for an acute visit for cold symptoms.  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    He states decreased appetite, chills, subjective fever, congestion, ear pain, sinus pain, sore throat, eye watering, chest tightness, cough that is productive, wheeze, sob, chest pain, body aches, headaches and lightheadedness.  He has had legionella in the past and is concerned he may have that again.  He feels terrible and this came on quick and got worse quick.    He has tried taking sudafed and motrin   Medications and allergies reviewed with patient and updated if appropriate.  Patient Active Problem List   Diagnosis Date Noted  . Hearing loss due to cerumen impaction, right 11/05/2017  . Parotiditis 11/05/2017  . Osteoarthritis, hand, primary localized, left 11/05/2017  . SVT (supraventricular tachycardia) ([place]) 11/06/2015  . Deep vein thrombosis (DVT) of left upper extremity ([place]) 11/06/2015  . Heart murmur   . Primary gout 08/26/2014  . Routine general medical examination at a health care facility 09/06/2013  . GERD (gastroesophageal reflux disease) 06/30/2013  . Essential hypertension, benign 06/30/2013  . Hyperlipidemia with target LDL less than 100 06/30/2013  . Urticaria due to cold 06/30/2013    Current Outpatient Medications on File Prior to Visit  Medication Sig Dispense Refill  . allopurinol (ZYLOPRIM) 300 MG tablet Take 1 tablet (300 mg total) by mouth daily. 90 tablet 3  . diclofenac sodium (VOLTAREN) 1 % GEL Apply 2 g topically 4 (four) times daily. 100 g 5  . EPINEPHrine 0.3 mg/0.3 mL IJ SOAJ injection Inject 0.3 mg into the muscle as needed (ANAPHYLAXIS).    . Glucosamine-Chondroit-Vit C-Mn (GLUCOSAMINE 1500 COMPLEX PO) Take 1 tablet by mouth daily.     . metoprolol succinate  (TOPROL-XL) 50 MG 24 hr tablet Take 1 tablet by mouth  daily with or immediately  following a meal 90 tablet 3  . pantoprazole (PROTONIX) 40 MG tablet TAKE 1 TABLET BY MOUTH  DAILY 90 tablet 1  . rizatriptan (MAXALT) 10 MG tablet Take 1 tablet (10 mg total) by mouth as needed for migraine. May repeat in 2 hours if needed 10 tablet 5  . rosuvastatin (CRESTOR) 10 MG tablet Take 1 tablet (10 mg total) by mouth daily. 90 tablet 3   No current facility-administered medications on file prior to visit.     Past Medical History:  Diagnosis Date  . Frequent headaches   . GERD (gastroesophageal reflux disease)   . H/O cardiac arrhythmia   . H/O phlebitis   . Heart murmur   . History of chicken pox   . Hyperlipidemia   . Hypertension     Past Surgical History:  Procedure Laterality Date  . HERNIA REPAIR    . Left elbow radial collateral ligament repair  09/2012    Social History   Socioeconomic History  . Marital status: Married    Spouse name: Not on file  . Number of children: Not on file  . Years of education: Not on file  . Highest education level: Not on file  Occupational History  . Not on file  Social Needs  . Financial resource strain: Not on file  . Food insecurity:    Worry: Not on file  Inability: Not on file  . Transportation needs:    Medical: Not on file    Non-medical: Not on file  Tobacco Use  . Smoking status: Never Smoker  . Smokeless tobacco: Never Used  Substance and Sexual Activity  . Alcohol use: No  . Drug use: No  . Sexual activity: Yes  Lifestyle  . Physical activity:    Days per week: Not on file    Minutes per session: Not on file  . Stress: Not on file  Relationships  . Social connections:    Talks on phone: Not on file    Gets together: Not on file    Attends religious service: Not on file    Active member of club or organization: Not on file    Attends meetings of clubs or organizations: Not on file    Relationship status: Not on  file  Other Topics Concern  . Not on file  Social History Narrative  . Not on file    Family History  Problem Relation Age of Onset  . Lung cancer Mother   . Arthritis Mother   . Hyperlipidemia Mother   . Hypertension Mother   . Alcohol abuse Father   . Heart disease Father   . Prostate cancer Father   . Hyperlipidemia Father   . Stroke Father   . Hypertension Father   . Heart attack Father   . Alcohol abuse Sister   . Drug abuse Sister   . Alcohol abuse Brother   . Heart attack Brother   . Anemia Paternal Grandfather     Review of Systems  Constitutional: Positive for appetite change (decreased) and chills. Negative for fever.  HENT: Positive for congestion, ear pain, sinus pressure, sinus pain and sore throat.   Eyes: Positive for discharge.  Respiratory: Positive for cough (productive of yellow), chest tightness, shortness of breath and wheezing (at night).   Cardiovascular: Positive for chest pain.  Musculoskeletal: Positive for myalgias.  Neurological: Positive for light-headedness and headaches.       Objective:   Vitals:   02/18/18 1306  BP: 110/72  Pulse: 75  Resp: 16  Temp: 98.5 F (36.9 C)  SpO2: 98%   Filed Weights   02/18/18 1306  Weight: 218 lb (98.9 kg)   Body mass index is 33.15 kg/m.  Wt Readings from Last 3 Encounters:  02/18/18 218 lb (98.9 kg)  11/05/17 221 lb (100.2 kg)  07/03/17 209 lb (94.8 kg)     Physical Exam GENERAL APPEARANCE: Appears stated age, non-toxic appearing EYES: conjunctiva clear, no icterus HEENT: bilateral tympanic membranes and ear canals normal, oropharynx with mild erythema, no thyromegaly, trachea midline, no cervical or supraclavicular lymphadenopathy LUNGS: Clear to auscultation without wheeze or crackles, unlabored breathing, good air entry bilaterally CARDIOVASCULAR: Normal S1,S2 without murmurs, no edema SKIN: warm, dry       Assessment & Plan:   See Problem List for Assessment and Plan of  chronic medical problems.

## 2018-02-18 NOTE — Assessment & Plan Note (Signed)
Symptoms consistent with bronchitis, concern for PNA Has had exposure for possible legionella, which he has had in the past Urine Ag for legionella cxr Start zpak otc cold meds Rest, fluids Call if no improvement

## 2018-02-18 NOTE — Patient Instructions (Signed)
Have a chest xray and give a urine sample.    We will call you with the results   An antibiotic was sent to your pharmacy.  Take as directed.  Take over the counter medication as needed.    Call if no improvement

## 2018-02-20 LAB — LEGIONELLA ANTIGEN, URINE
LEGIONELLA ANTIGEN, URINE: NOT DETECTED
MICRO NUMBER:: 90592173
SOURCE:: 0
SPECIMEN QUALITY:: ADEQUATE

## 2018-04-21 ENCOUNTER — Ambulatory Visit (INDEPENDENT_AMBULATORY_CARE_PROVIDER_SITE_OTHER)
Admission: RE | Admit: 2018-04-21 | Discharge: 2018-04-21 | Disposition: A | Discharge status: Home / Self Care | Payer: Managed Care, Other (non HMO) | Source: Ambulatory Visit | Attending: Internal Medicine | Admitting: Internal Medicine

## 2018-04-21 ENCOUNTER — Encounter: Payer: Self-pay | Admitting: Internal Medicine

## 2018-04-21 ENCOUNTER — Ambulatory Visit: Payer: Managed Care, Other (non HMO) | Admitting: Internal Medicine

## 2018-04-21 VITALS — BP 122/80 | HR 79 | Ht 68.0 in | Wt 217.0 lb

## 2018-04-21 DIAGNOSIS — M25551 Pain in right hip: Secondary | ICD-10-CM | POA: Diagnosis not present

## 2018-04-21 DIAGNOSIS — G8929 Other chronic pain: Secondary | ICD-10-CM

## 2018-04-21 NOTE — Patient Instructions (Signed)
Hip Bursitis Hip bursitis is inflammation of a fluid-filled sac (bursa) in the hip joint. The bursa protects the bones in the hip joint from rubbing against each other. Hip bursitis can cause mild to moderate pain, and symptoms often come and go over time. What are the causes? This condition may be caused by:  Injury to the hip.  Overuse of the muscles that surround the hip joint.  Arthritis or gout.  Diabetes.  Thyroid disease.  Cold weather.  Infection.  In some cases, the cause may not be known. What are the signs or symptoms? Symptoms of this condition may include:  Mild or moderate pain in the hip area. Pain may get worse with movement.  Tenderness and swelling of the hip, especially on the outer side of the hip.  Symptoms may come and go. If the bursa becomes infected, you may have the following symptoms:  Fever.  Red skin and a feeling of warmth in the hip area.  How is this diagnosed? This condition may be diagnosed based on:  A physical exam.  Your medical history.  X-rays.  Removal of fluid from your inflamed bursa for testing (biopsy).  You may be sent to a health care provider who specializes in bone diseases (orthopedist) or a provider who specializes in joint inflammation (rheumatologist). How is this treated? This condition is treated by resting, raising (elevating), and applying pressure(compression) to the injured area. In some cases, this may be enough to make your symptoms go away. Treatment may also include:  Crutches.  Antibiotic medicine.  Draining fluid out of the bursa to help relieve swelling.  Injecting medicine that helps to reduce inflammation (cortisone).  Follow these instructions at home: Medicines  Take over-the-counter and prescription medicines only as told by your health care provider.  Do not drive or operate heavy machinery while taking prescription pain medicine, or as told by your health care provider.  If you were  prescribed an antibiotic, take it as told by your health care provider. Do not stop taking the antibiotic even if you start to feel better. Activity  Return to your normal activities as told by your health care provider. Ask your health care provider what activities are safe for you.  Rest and protect your hip as much as possible until your pain and swelling get better. General instructions  Wear compression wraps only as told by your health care provider.  Elevate your hip above the level of your heart as much as you can without pain. To do this, try putting a pillow under your hips while you lie down.  Do not use your hip to support your body weight until your health care provider says that you can. Use crutches as told by your health care provider.  Gently massage and stretch your injured area as often as is comfortable.  Keep all follow-up visits as told by your health care provider. This is important. How is this prevented?  Exercise regularly, as told by your health care provider.  Warm up and stretch before being active.  Cool down and stretch after being active.  If an activity irritates your hip or causes pain, avoid the activity as much as possible.  Avoid sitting down for long periods at a time. Contact a health care provider if:  You have a fever.  You develop new symptoms.  You have difficulty walking or doing everyday activities.  You have pain that gets worse or does not get better with medicine.  You   develop red skin or a feeling of warmth in your hip area. Get help right away if:  You cannot move your hip.  You have severe pain. This information is not intended to replace advice given to you by your health care provider. Make sure you discuss any questions you have with your health care provider. Document Released: 03/15/2002 Document Revised: 02/29/2016 Document Reviewed: 04/25/2015 Elsevier Interactive Patient Education  2018 [employer]  
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
## 2018-05-07 ENCOUNTER — Telehealth: Payer: Self-pay | Admitting: Family Medicine

## 2018-05-07 NOTE — Telephone Encounter (Signed)
lmovm for pt to return call.  

## 2018-05-07 NOTE — Telephone Encounter (Signed)
[name] Comment.  I have scheduled patient for 8/22 but he would love to get in sooner to see [facility].  If there are any openings sooner please call patient in regard.

## 2018-05-18 NOTE — Telephone Encounter (Signed)
lmovm for pt to return call.  

## 2018-05-21 ENCOUNTER — Ambulatory Visit: Payer: Managed Care, Other (non HMO) | Admitting: Internal Medicine
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
## 2018-05-28 ENCOUNTER — Encounter: Payer: Self-pay | Admitting: Family Medicine

## 2018-05-28 ENCOUNTER — Other Ambulatory Visit: Payer: Self-pay

## 2018-05-28 ENCOUNTER — Ambulatory Visit: Payer: Managed Care, Other (non HMO) | Admitting: Family Medicine

## 2018-05-28 ENCOUNTER — Ambulatory Visit: Payer: Self-pay

## 2018-05-28 VITALS — BP 130/80 | HR 69 | Ht 68.0 in | Wt 212.0 lb

## 2018-05-28 DIAGNOSIS — M25551 Pain in right hip: Secondary | ICD-10-CM | POA: Diagnosis not present

## 2018-05-28 DIAGNOSIS — M7061 Trochanteric bursitis, right hip: Secondary | ICD-10-CM | POA: Insufficient documentation

## 2018-05-28 DIAGNOSIS — M19042 Primary osteoarthritis, left hand: Secondary | ICD-10-CM

## 2018-05-28 MED ORDER — DICLOFENAC SODIUM 2 % TD SOLN: 2.0000 g | Freq: Two times a day (BID) | TRANSDERMAL | 3 refills | Status: DC

## 2018-05-28 MED ORDER — VITAMIN D (ERGOCALCIFEROL) 1.25 MG (50000 UNIT) PO CAPS
50000.0000 [IU] | ORAL_CAPSULE | ORAL | 0 refills | Status: DC
Start: 2018-05-28 — End: 2023-08-14

## 2018-05-28 NOTE — Patient Instructions (Signed)
Good to see you  Ice 20 minutes 2 times daily. Usually after activity and before bed. pennsaid pinkie amount topically 2 times daily as needed.  Once weekly vitamin D for 12 weeks K2 at [employer] or [employer] but daily for 1 month only  Exercises 3 times a week.  See me again in 4 weeks to make sure you are doing well

## 2018-05-28 NOTE — Assessment & Plan Note (Signed)
Patient given injection.  Discussed icing regimen and home exercises.  Discussed icing regimen and home exercise discussed topical anti-inflammatories that were prescribed.  Patient will continue to try to increase activity but does have a lot of musculoskeletal complaints that could be contributing.

## 2018-05-29 ENCOUNTER — Telehealth: Payer: Self-pay | Admitting: Internal Medicine

## 2018-05-29 NOTE — Telephone Encounter (Signed)
Copied from [place] [id number]. Topic: Quick Communication - See Telephone Encounter >> May 29, 2018  3:26 PM Antonieta Iba C wrote:  CRM for notification. See Telephone encounter for: 05/29/18.   Pt says that he was told that provider is going to send in Rx for K2 also, pt states that pharmacy has not received Rx.   Pharmacy: [id number] [address], [place] [phone] (Phone) [phone] (Fax)

## 2018-05-29 NOTE — Telephone Encounter (Signed)
Left message for patient- chart states K2 is available at [facility] and [facility]. Please call back if has questions

## 2018-05-30 ENCOUNTER — Other Ambulatory Visit: Payer: Self-pay | Admitting: Internal Medicine

## 2018-05-30 DIAGNOSIS — K219 Gastro-esophageal reflux disease without esophagitis: Secondary | ICD-10-CM

## 2018-05-30 DIAGNOSIS — I471 Supraventricular tachycardia: Secondary | ICD-10-CM

## 2018-05-30 DIAGNOSIS — I10 Essential (primary) hypertension: Secondary | ICD-10-CM

## 2018-05-30 DIAGNOSIS — M1000 Idiopathic gout, unspecified site: Secondary | ICD-10-CM

## 2018-05-30 DIAGNOSIS — E785 Hyperlipidemia, unspecified: Secondary | ICD-10-CM

## 2018-06-04 NOTE — Telephone Encounter (Signed)
Pt notified that K2 could be purchased at [facility] or [facility] per office notes of Dr. Tamala Julian on 05/28/18. Pt verbalized understanding.

## 2018-06-30 ENCOUNTER — Ambulatory Visit: Payer: Managed Care, Other (non HMO) | Admitting: Internal Medicine

## 2018-06-30 ENCOUNTER — Encounter: Payer: Self-pay | Admitting: Internal Medicine

## 2018-06-30 ENCOUNTER — Encounter: Payer: Self-pay | Admitting: Family Medicine

## 2018-06-30 ENCOUNTER — Ambulatory Visit: Payer: Managed Care, Other (non HMO) | Admitting: Family Medicine

## 2018-06-30 VITALS — BP 120/76 | HR 70 | Ht 68.0 in | Wt 214.0 lb

## 2018-06-30 VITALS — BP 120/80 | HR 59 | Temp 98.1°F | Ht 68.0 in | Wt 214.0 lb

## 2018-06-30 DIAGNOSIS — Z23 Encounter for immunization: Secondary | ICD-10-CM | POA: Diagnosis not present

## 2018-06-30 DIAGNOSIS — I10 Essential (primary) hypertension: Secondary | ICD-10-CM

## 2018-06-30 DIAGNOSIS — G8929 Other chronic pain: Secondary | ICD-10-CM

## 2018-06-30 DIAGNOSIS — S92011S Displaced fracture of body of right calcaneus, sequela: Secondary | ICD-10-CM

## 2018-06-30 DIAGNOSIS — M25571 Pain in right ankle and joints of right foot: Secondary | ICD-10-CM

## 2018-06-30 DIAGNOSIS — R197 Diarrhea, unspecified: Secondary | ICD-10-CM | POA: Insufficient documentation

## 2018-06-30 DIAGNOSIS — R1011 Right upper quadrant pain: Secondary | ICD-10-CM | POA: Diagnosis not present

## 2018-06-30 DIAGNOSIS — H6981 Other specified disorders of Eustachian tube, right ear: Secondary | ICD-10-CM | POA: Diagnosis not present

## 2018-06-30 DIAGNOSIS — M7061 Trochanteric bursitis, right hip: Secondary | ICD-10-CM | POA: Diagnosis not present

## 2018-06-30 DIAGNOSIS — S92009A Unspecified fracture of unspecified calcaneus, initial encounter for closed fracture: Secondary | ICD-10-CM | POA: Insufficient documentation

## 2018-06-30 MED ORDER — TRIAMCINOLONE ACETONIDE 55 MCG/ACT NA AERO: 2.0000 | INHALATION_SPRAY | Freq: Every day | NASAL | 5 refills | Status: DC

## 2018-06-30 NOTE — Assessment & Plan Note (Signed)
Improved after injection.  Continue conservative therapy.  Discussed icing regimen.  Differential includes lumbar radiculopathy.  Do not feel that further evaluation is necessary.  Follow-up again in 6 to 8 weeks

## 2018-06-30 NOTE — Progress Notes (Signed)
[name] [address], [place] [address] Phone: [phone] Subjective:   Mark [name], am serving as a scribe for Dr. Hulan Saas.    CC: Right hip and ankle pain  [id number]  Mark [name] is a 57 y.o. male coming in with complaint of hip pain. Injection did help alleviate his pain. Use to have trouble lying on his side but he is able to now. He is doing the clam shell exercises which help his pain. Has not been doing sit ups or leg lifts as those bothered him. Is using vitamin d and vitamin k.  Patient was doing 50% better.  Patient states that the heel is improving as well.  First time he has not had as severe pain.  Patient states is not having as much pain to even light sensation.     Past Medical History:  Diagnosis Date  . Frequent headaches   . GERD (gastroesophageal reflux disease)   . H/O cardiac arrhythmia   . H/O phlebitis   . Heart murmur   . History of chicken pox   . Hyperlipidemia   . Hypertension    Past Surgical History:  Procedure Laterality Date  . HERNIA REPAIR    . Left elbow radial collateral ligament repair  09/2012   Social History   Socioeconomic History  . Marital status: Married    Spouse name: Not on file  . Number of children: Not on file  . Years of education: Not on file  . Highest education level: Not on file  Occupational History  . Not on file  Social Needs  . Financial resource strain: Not on file  . Food insecurity:    Worry: Not on file    Inability: Not on file  . Transportation needs:    Medical: Not on file    Non-medical: Not on file  Tobacco Use  . Smoking status: Never Smoker  . Smokeless tobacco: Never Used  Substance and Sexual Activity  . Alcohol use: [name]  . Drug use: [name]  . Sexual activity: Yes  Lifestyle  . Physical activity:    Days per week: Not on file    Minutes per session: Not on file  . Stress: Not on file  Relationships  . Social connections:   Talks on phone: Not on file    Gets together: Not on file    Attends religious service: Not on file    Active member of club or organization: Not on file    Attends meetings of clubs or organizations: Not on file    Relationship status: Not on file  Other Topics Concern  . Not on file  Social History Narrative  . Not on file   Allergies  Allergen Reactions  . Cold Buster [Zinc] Hives   Family History  Problem Relation Age of Onset  . Lung cancer Mother   . Arthritis Mother   . Hyperlipidemia Mother   . Hypertension Mother   . Alcohol abuse Father   . Heart disease Father   . Prostate cancer Father   . Hyperlipidemia Father   . Stroke Father   . Hypertension Father   . Heart attack Father   . Alcohol abuse Sister   . Drug abuse Sister   . Alcohol abuse Brother   . Heart attack Brother   . Anemia Paternal Grandfather      Current Outpatient Medications (Cardiovascular):  Marland Kitchen  EPINEPHrine 0.3 mg/0.3 mL IJ SOAJ  injection, Inject 0.3 mg into the muscle as needed (ANAPHYLAXIS). Marland Kitchen  metoprolol succinate (TOPROL-XL) 50 MG 24 hr tablet, TAKE 1 TABLET BY MOUTH  DAILY WITH OR IMMEDIATELY  FOLLOWING A MEAL .  rosuvastatin (CRESTOR) 10 MG tablet, TAKE 1 TABLET BY MOUTH  DAILY  Current Outpatient Medications (Respiratory):  .  triamcinolone (NASACORT) 55 MCG/ACT AERO nasal inhaler, Place 2 sprays into the nose daily.  Current Outpatient Medications (Analgesics):  .  allopurinol (ZYLOPRIM) 300 MG tablet, TAKE 1 TABLET BY MOUTH  DAILY .  rizatriptan (MAXALT) 10 MG tablet, Take 1 tablet (10 mg total) by mouth as needed for migraine. May repeat in 2 hours if needed   Current Outpatient Medications (Other):  Marland Kitchen  Diclofenac Sodium (PENNSAID) 2 % SOLN, Place 2 g onto the skin 2 (two) times daily. .  diclofenac sodium (VOLTAREN) 1 % GEL, Apply 2 g topically 4 (four) times daily. .  Glucosamine-Chondroit-Vit C-Mn (GLUCOSAMINE 1500 COMPLEX PO), Take 1 tablet by mouth daily.  .  pantoprazole  (PROTONIX) 40 MG tablet, TAKE 1 TABLET BY MOUTH  DAILY .  Vitamin D, Ergocalciferol, (DRISDOL) 50000 units CAPS capsule, Take 1 capsule (50,000 Units total) by mouth every 7 (seven) days.    Past medical history, social, surgical and family history all reviewed in electronic medical record.  [name] pertanent information unless stated regarding to the chief complaint.   Review of Systems:  [name] headache, visual changes, nausea, vomiting, diarrhea, constipation, dizziness, abdominal pain, skin rash, fevers, chills, night sweats, weight loss, swollen lymph nodes, body aches, joint swelling, muscle aches, chest pain, shortness of breath, mood changes.  Positive muscle aches  Objective  Blood pressure 120/76, pulse 70, height 5\' 8"  (1.727 m), weight 214 lb (97.1 kg), SpO2 98 %.    General: [name] apparent distress alert and oriented x3 mood and affect normal, dressed appropriately.  HEENT: Pupils equal, extraocular movements intact  Respiratory: Patient's speak in full sentences and does not appear short of breath  Cardiovascular: [name] lower extremity edema, non tender, [name] erythema  Skin: Warm dry intact with [name] signs of infection or rash on extremities or on axial skeleton.  Abdomen: Soft nontender  Neuro: Cranial nerves II through XII are intact, neurovascularly intact in all extremities with 2+ DTRs and 2+ pulses.  Lymph: [name] lymphadenopathy of posterior or anterior cervical chain or axillae bilaterally.  Gait antalgic MSK:  Non tender with full range of motion and good stability and symmetric strength and tone of shoulders, elbows, wrist, , knee and ankles bilaterally.  Right hip exam still has very mild tenderness to palpation over the greater trochanteric area.  Heel exam does show loss of range of motion.  Tenderness to palpation diffusely around the calcaneal region.  Neurovascularly intact distally.   Impression and Recommendations:     This case required medical decision making of moderate  complexity. The above documentation has been reviewed and is accurate and complete Mark Pulley, DO       Note: This dictation was prepared with Dragon dictation along with smaller phrase technology. Any transcriptional errors that result from this process are unintentional.

## 2018-06-30 NOTE — Assessment & Plan Note (Signed)
Long-term standing.  Will consider further evaluation with x-ray.  Seems to be making some mild progress with once weekly vitamin D and the K2.  Warned of potential side effects.  Follow-up again in 4 weeks
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
## 2018-06-30 NOTE — Patient Instructions (Signed)

## 2018-06-30 NOTE — Patient Instructions (Addendum)
Good to see you  Mark [name] is your friend.  Stay active Keep working on it  Once weekly vitamin D another 3 months  See me again in 3 months and get xray before I see you

## 2018-07-08 ENCOUNTER — Other Ambulatory Visit (INDEPENDENT_AMBULATORY_CARE_PROVIDER_SITE_OTHER): Payer: Managed Care, Other (non HMO)

## 2018-07-08 DIAGNOSIS — R1011 Right upper quadrant pain: Secondary | ICD-10-CM | POA: Diagnosis not present

## 2018-07-08 DIAGNOSIS — R197 Diarrhea, unspecified: Secondary | ICD-10-CM

## 2018-07-08 DIAGNOSIS — I10 Essential (primary) hypertension: Secondary | ICD-10-CM

## 2018-07-08 LAB — CBC WITH DIFFERENTIAL/PLATELET
BASOS ABS: 0.1 10*3/uL (ref 0.0–0.1)
Basophils Relative: 0.6 % (ref 0.0–3.0)
Eosinophils Absolute: 0.1 10*3/uL (ref 0.0–0.7)
Eosinophils Relative: 0.8 % (ref 0.0–5.0)
HCT: 44.4 % (ref 39.0–52.0)
Hemoglobin: 14.7 g/dL (ref 13.0–17.0)
LYMPHS ABS: 2.2 10*3/uL (ref 0.7–4.0)
Lymphocytes Relative: 25.2 % (ref 12.0–46.0)
MCHC: 33.1 g/dL (ref 30.0–36.0)
MCV: 91.2 fl (ref 78.0–100.0)
MONOS PCT: 8.6 % (ref 3.0–12.0)
Monocytes Absolute: 0.7 10*3/uL (ref 0.1–1.0)
NEUTROS ABS: 5.6 10*3/uL (ref 1.4–7.7)
Neutrophils Relative %: 64.8 % (ref 43.0–77.0)
Platelets: 253 10*3/uL (ref 150.0–400.0)
RBC: 4.87 Mil/uL (ref 4.22–5.81)
RDW: 13.4 % (ref 11.5–15.5)
WBC: 8.6 10*3/uL (ref 4.0–10.5)

## 2018-07-08 LAB — SEDIMENTATION RATE: SED RATE: 7 mm/h (ref 0–20)

## 2018-07-08 LAB — COMPREHENSIVE METABOLIC PANEL
ALT: 19 U/L (ref 0–53)
AST: 25 U/L (ref 0–37)
Albumin: 4.6 g/dL (ref 3.5–5.2)
Alkaline Phosphatase: 71 U/L (ref 39–117)
BILIRUBIN TOTAL: 0.8 mg/dL (ref 0.2–1.2)
BUN: 18 mg/dL (ref 6–23)
CO2: 31 meq/L (ref 19–32)
Calcium: 9.6 mg/dL (ref 8.4–10.5)
Chloride: 103 mEq/L (ref 96–112)
Creatinine, Ser: 1.22 mg/dL (ref 0.40–1.50)
GFR: 64.78 mL/min (ref >60.00)
GLUCOSE: 99 mg/dL (ref 70–99)
Potassium: 4.2 mEq/L (ref 3.5–5.1)
SODIUM: 141 meq/L (ref 135–145)
Total Protein: 7.1 g/dL (ref 6.0–8.3)

## 2018-07-14 ENCOUNTER — Encounter: Payer: Self-pay | Admitting: Internal Medicine

## 2018-07-14 LAB — GLIADIN ANTIBODIES, SERUM
GLIADIN IGA: 4 U
Gliadin IgG: 2 Units

## 2018-07-14 LAB — RETICULIN ANTIBODIES, IGA W TITER: Reticulin IGA Screen: NEGATIVE

## 2018-07-14 LAB — TISSUE TRANSGLUTAMINASE, IGA: (tTG) Ab, IgA: 1 U/mL

## 2018-07-15 ENCOUNTER — Encounter: Payer: Self-pay | Admitting: Internal Medicine

## 2018-07-15 ENCOUNTER — Ambulatory Visit
Admission: RE | Admit: 2018-07-15 | Discharge: 2018-07-15 | Disposition: A | Discharge status: Home / Self Care | Payer: Managed Care, Other (non HMO) | Source: Ambulatory Visit | Attending: Internal Medicine | Admitting: Internal Medicine

## 2018-07-15 DIAGNOSIS — R1011 Right upper quadrant pain: Secondary | ICD-10-CM

## 2018-09-15 ENCOUNTER — Encounter: Payer: Self-pay | Admitting: Internal Medicine

## 2018-09-15 ENCOUNTER — Ambulatory Visit: Payer: Managed Care, Other (non HMO) | Admitting: Internal Medicine

## 2018-09-15 VITALS — BP 124/80 | HR 66 | Temp 98.4°F | Ht 68.0 in | Wt 217.0 lb

## 2018-09-15 DIAGNOSIS — J069 Acute upper respiratory infection, unspecified: Secondary | ICD-10-CM | POA: Diagnosis not present

## 2018-09-15 DIAGNOSIS — B9789 Other viral agents as the cause of diseases classified elsewhere: Secondary | ICD-10-CM

## 2018-09-15 MED ORDER — METHYLPREDNISOLONE ACETATE 40 MG/ML IJ SUSP
40.0000 mg | Freq: Once | INTRAMUSCULAR | Status: AC
  Administered 2018-09-15: 40 mg via INTRAMUSCULAR

## 2018-09-15 NOTE — Progress Notes (Signed)
   Subjective:    Patient ID: Mark [name], male    DOB: [birth year], 57 y.o.   MRN: [id number]  HPI The patient is a 57 YO man coming in for sore throat. Started about 2-3 days ago with pain in throat. 2 people at work have strep throat and he spent a lot of time with 1 over the weekend. Is coughing as well and nose and sinus drainage. Has tried cough drops. Takes zyrtec and claritin all the time. Accompanied by fevers and chills this weekend, sore throat and cough (non-productive), and sinus drainage. Some ear pressure, some swelling in neck and soreness. No problems swallowing. Voice is hoarse. Overall stable since onset although mild improvement since this morning in sore throat symptoms. Due to prior conditions he cannot take otc medications and has history of several salivary gland infections with things like this and he wanted to be cautious about this.    Review of Systems  Constitutional: Positive for activity change, appetite change and chills. Negative for fatigue, fever and unexpected weight change.  HENT: Positive for congestion, postnasal drip, rhinorrhea, sinus pressure, sore throat and voice change. Negative for ear discharge, ear pain, sinus pain, sneezing, tinnitus and trouble swallowing.   Eyes: Negative.   Respiratory: Positive for cough. Negative for chest tightness, shortness of breath and wheezing.   Cardiovascular: Negative.   Gastrointestinal: Negative.   Musculoskeletal: Positive for myalgias.  Neurological: Negative.       Objective:   Physical Exam  Constitutional: He is oriented to person, place, and time. He appears well-developed and well-nourished.  HENT:  Head: Normocephalic and atraumatic.  Oropharynx with redness and clear drainage, nose with swollen turbinates, TMs normal bilaterally  Eyes: EOM are normal.  Neck: Normal range of motion. No thyromegaly present.  Mild shotty CAD  Cardiovascular: Normal rate and regular rhythm.  Pulmonary/Chest: Effort  normal and breath sounds normal. No respiratory distress. He has no wheezes. He has no rales.  Abdominal: Soft.  Musculoskeletal: He exhibits no tenderness.  Lymphadenopathy:    He has cervical adenopathy.  Neurological: He is alert and oriented to person, place, and time.  Skin: Skin is warm and dry.   Vitals:   09/15/18 0901  BP: 124/80  Pulse: 66  Temp: 98.4 F (36.9 C)  TempSrc: Oral  SpO2: 99%  Weight: 217 lb (98.4 kg)  Height: 5\' 8"  (1.727 m)      Assessment & Plan:  Depo-medrol 40 mg IM given at visit

## 2018-09-15 NOTE — Patient Instructions (Signed)
We have given you a shot today that should help you get better quicker.   Keep taking the claritin and zyrtec as these help to reduce drainage.   Warm liquids and honey sometimes can help with the sore throat.

## 2018-09-15 NOTE — Assessment & Plan Note (Signed)
No evidence of strep throat and centor criteria do not suggest need for testing or culture. No indication for antibiotics. Depo-medrol 40 mg IM given at visit for inflammation. Continue zyrtec and claritin. Supportive measures discussed.

## 2018-10-18 ENCOUNTER — Other Ambulatory Visit: Payer: Self-pay | Admitting: Internal Medicine

## 2018-10-18 DIAGNOSIS — I471 Supraventricular tachycardia: Secondary | ICD-10-CM

## 2018-10-18 DIAGNOSIS — M1000 Idiopathic gout, unspecified site: Secondary | ICD-10-CM

## 2018-10-18 DIAGNOSIS — E785 Hyperlipidemia, unspecified: Secondary | ICD-10-CM

## 2018-10-18 DIAGNOSIS — K219 Gastro-esophageal reflux disease without esophagitis: Secondary | ICD-10-CM

## 2018-10-18 DIAGNOSIS — I10 Essential (primary) hypertension: Secondary | ICD-10-CM

## 2019-01-12 ENCOUNTER — Other Ambulatory Visit: Payer: Self-pay | Admitting: Internal Medicine

## 2019-01-12 DIAGNOSIS — I471 Supraventricular tachycardia: Secondary | ICD-10-CM

## 2019-01-12 DIAGNOSIS — M1000 Idiopathic gout, unspecified site: Secondary | ICD-10-CM

## 2019-01-12 DIAGNOSIS — I10 Essential (primary) hypertension: Secondary | ICD-10-CM

## 2019-01-12 DIAGNOSIS — K219 Gastro-esophageal reflux disease without esophagitis: Secondary | ICD-10-CM

## 2019-01-12 DIAGNOSIS — E785 Hyperlipidemia, unspecified: Secondary | ICD-10-CM

## 2019-02-15 ENCOUNTER — Other Ambulatory Visit (INDEPENDENT_AMBULATORY_CARE_PROVIDER_SITE_OTHER): Payer: Managed Care, Other (non HMO)

## 2019-02-15 ENCOUNTER — Encounter: Payer: Self-pay | Admitting: Internal Medicine

## 2019-02-15 ENCOUNTER — Ambulatory Visit (INDEPENDENT_AMBULATORY_CARE_PROVIDER_SITE_OTHER)
Admission: RE | Admit: 2019-02-15 | Discharge: 2019-02-15 | Disposition: A | Discharge status: Home / Self Care | Payer: Managed Care, Other (non HMO) | Source: Ambulatory Visit | Attending: Internal Medicine | Admitting: Internal Medicine

## 2019-02-15 ENCOUNTER — Ambulatory Visit (INDEPENDENT_AMBULATORY_CARE_PROVIDER_SITE_OTHER): Payer: Managed Care, Other (non HMO) | Admitting: Internal Medicine

## 2019-02-15 ENCOUNTER — Other Ambulatory Visit: Payer: Self-pay

## 2019-02-15 DIAGNOSIS — R109 Unspecified abdominal pain: Secondary | ICD-10-CM

## 2019-02-15 DIAGNOSIS — G8929 Other chronic pain: Secondary | ICD-10-CM | POA: Insufficient documentation

## 2019-02-15 LAB — URINALYSIS, ROUTINE W REFLEX MICROSCOPIC
Bilirubin Urine: NEGATIVE
Hgb urine dipstick: NEGATIVE
Ketones, ur: NEGATIVE
Leukocytes,Ua: NEGATIVE
Nitrite: NEGATIVE
RBC / HPF: NONE SEEN (ref 0–?)
Specific Gravity, Urine: 1.025 (ref 1.000–1.030)
Total Protein, Urine: NEGATIVE
Urine Glucose: NEGATIVE
Urobilinogen, UA: 0.2 (ref 0.0–1.0)
WBC, UA: NONE SEEN (ref 0–?)
pH: 5 (ref 5.0–8.0)

## 2019-02-15 MED ORDER — OXYCODONE HCL 5 MG PO TABS: 5.0000 mg | ORAL_TABLET | ORAL | 0 refills | Status: AC | PRN

## 2019-02-15 MED ORDER — ETODOLAC ER 400 MG PO TB24: 400.0000 mg | ORAL_TABLET | Freq: Every day | ORAL | 0 refills | Status: DC

## 2019-02-15 MED ORDER — OXYCODONE HCL 5 MG PO TABS: 5.0000 mg | ORAL_TABLET | ORAL | 0 refills | Status: DC | PRN

## 2019-02-15 NOTE — Progress Notes (Signed)
Virtual Visit via Video Note  I connected with Mark [name] on 02/15/19 at  9:00 AM EDT by a video enabled telemedicine application and verified that I am speaking with the correct person using two identifiers.   I discussed the limitations of evaluation and management by telemedicine and the availability of in person appointments. The patient expressed understanding and agreed to proceed.  History of Present Illness: He checked in for a virtual visit.  He was not willing to come into the office due to the COVID-19 pandemic.  He complains of a one-month history of left flank pain.  He says there was a minor injury when the pain started.  He describes it as an achy sensation that increases to a stabbing sensation with movement, deep breath, and sneezing.  He has not gotten much symptom relief with over-the-counter doses of Motrin.  He tells me the pain was keeping him awake at night so he has been taking an old supply of oxycodone to relieve the pain.  He denies cough, hemoptysis, nausea, vomiting, loss of appetite, weight loss, dysuria, or hematuria.  He has a remote history of kidney stones.    Observations/Objective: Healthy-appearing male in no distress.  He was calm, cooperative, and appropriate.  Respirations and speech were normal.  Lab Results  Component Value Date   WBC 8.6 07/08/2018   HGB 14.7 07/08/2018   HCT 44.4 07/08/2018   PLT 253.0 07/08/2018   GLUCOSE 99 07/08/2018   CHOL 130 07/10/2017   TRIG 98 07/10/2017   HDL 41 07/10/2017   LDLCALC 69 07/10/2017   ALT 19 07/08/2018   AST 25 07/08/2018   NA 141 07/08/2018   K 4.2 07/08/2018   CL 103 07/08/2018   CREATININE 1.22 07/08/2018   BUN 18 07/08/2018   CO2 31 07/08/2018   TSH 1.10 07/02/2016   PSA 0.33 07/02/2016   HGBA1C 5.9 06/03/2017   Dg Abd Acute 2+v W 1v Chest  Result Date: 02/15/2019 CLINICAL DATA:  58 year old male with left flank pain EXAM: DG ABDOMEN ACUTE W/ 1V CHEST COMPARISON:  02/18/2018, 07/21/2015  FINDINGS: Chest: Cardiomediastinal silhouette within normal limits. No pneumothorax pleural effusion or confluent airspace disease. Abdomen: Gas within stomach small bowel and colon. No abnormal distention. Mild stool burden. No radiopaque foreign body. No unexpected calcification or soft tissue density. No displaced fracture. Left apex scoliotic curvature of the lumbar spine, centered at L3. IMPRESSION: Chest: Negative for acute cardiopulmonary disease. Abdomen: No evidence of nephrolithiasis. Nonobstructive bowel gas pattern Electronically Signed   By: Corrie Mckusick D.O.   On: 02/15/2019 11:49    Assessment and Plan: He has musculoskeletal pain over the left flank area.  Plain films of the chest, ribs, and abdomen are normal.  His urine is negative for red blood cells or white blood cells.  I have asked him to upgrade his anti-inflammatory therapy to a daily dose of etodolic and to continue taking oxycodone as needed for the discomfort that keeps him awake at night.   Follow Up Instructions: He will let me know if he develops any new or worsening symptoms.  He agrees to use the medications listed as directed.  He will return for an in person follow-up sooner if the symptoms do not resolve.    I discussed the assessment and treatment plan with the patient. The patient was provided an opportunity to ask questions and all were answered. The patient agreed with the plan and demonstrated an understanding of the instructions.  The patient was advised to call back or seek an in-person evaluation if the symptoms worsen or if the condition fails to improve as anticipated.  I provided 25 minutes of non-face-to-face time during this encounter.   Scarlette Calico, MD

## 2019-02-16 ENCOUNTER — Encounter: Payer: Self-pay | Admitting: Internal Medicine

## 2019-02-17 ENCOUNTER — Encounter: Payer: Self-pay | Admitting: Internal Medicine

## 2019-02-23 ENCOUNTER — Ambulatory Visit (INDEPENDENT_AMBULATORY_CARE_PROVIDER_SITE_OTHER): Payer: Managed Care, Other (non HMO) | Admitting: Internal Medicine

## 2019-02-23 ENCOUNTER — Encounter: Payer: Self-pay | Admitting: Internal Medicine

## 2019-02-23 ENCOUNTER — Other Ambulatory Visit: Payer: Self-pay

## 2019-02-23 VITALS — BP 112/80 | HR 71 | Temp 98.5°F | Ht 68.0 in | Wt 219.0 lb

## 2019-02-23 DIAGNOSIS — S4991XA Unspecified injury of right shoulder and upper arm, initial encounter: Secondary | ICD-10-CM | POA: Diagnosis not present

## 2019-02-23 DIAGNOSIS — R109 Unspecified abdominal pain: Secondary | ICD-10-CM | POA: Diagnosis not present

## 2019-02-23 DIAGNOSIS — G8929 Other chronic pain: Secondary | ICD-10-CM

## 2019-02-23 NOTE — Progress Notes (Signed)
Subjective:  Patient ID: Mark [name], male    DOB: 01-26-1961  Age: 58 y.o. MRN: [id number]  CC: Back Pain (Left side under ribs no better)   HPI Russell Quinney presents for f/up - He complains of a one-month history of left flank pain.  He says there was a minor injury when the pain started.  He describes it as an achy sensation that increases to a stabbing sensation with movement, deep breath, and sneezing.  He has not gotten much symptom relief with over-the-counter doses of Motrin.  He tells me the pain was keeping him awake at night so he has been taking an old supply of oxycodone to relieve the pain.  He denies cough, hemoptysis, nausea, vomiting, loss of appetite, weight loss, dysuria, or hematuria.  He has a remote history of kidney stones.  The pain radiates into his left upper quadrant and posteriorly and inferiorly.   Outpatient Medications Prior to Visit  Medication Sig Dispense Refill  . allopurinol (ZYLOPRIM) 300 MG tablet TAKE 1 TABLET BY MOUTH  DAILY 90 tablet 1  . Diclofenac Sodium (PENNSAID) 2 % SOLN Place 2 g onto the skin 2 (two) times daily. 112 g 3  . EPINEPHrine 0.3 mg/0.3 mL IJ SOAJ injection Inject 0.3 mg into the muscle as needed (ANAPHYLAXIS).    Marland Kitchen etodolac (LODINE XL) 400 MG 24 hr tablet Take 1 tablet (400 mg total) by mouth daily. 90 tablet 0  . Glucosamine-Chondroit-Vit C-Mn (GLUCOSAMINE 1500 COMPLEX PO) Take 1 tablet by mouth daily.     . metoprolol succinate (TOPROL-XL) 50 MG 24 hr tablet TAKE 1 TABLET BY MOUTH  DAILY WITH OR IMMEDIATELY  FOLLOWING A MEAL 90 tablet 1  . pantoprazole (PROTONIX) 40 MG tablet TAKE 1 TABLET BY MOUTH  DAILY 90 tablet 1  . rizatriptan (MAXALT) 10 MG tablet Take 1 tablet (10 mg total) by mouth as needed for migraine. May repeat in 2 hours if needed 10 tablet 5  . rosuvastatin (CRESTOR) 10 MG tablet TAKE 1 TABLET BY MOUTH  DAILY 90 tablet 1  . triamcinolone (NASACORT) 55 MCG/ACT AERO nasal inhaler Place 2 sprays into the nose daily.  32.4 mL 5  . Vitamin D, Ergocalciferol, (DRISDOL) 50000 units CAPS capsule Take 1 capsule (50,000 Units total) by mouth every 7 (seven) days. 12 capsule 0   No facility-administered medications prior to visit.     ROS Review of Systems  Constitutional: Positive for unexpected weight change (wt gain). Negative for appetite change, chills, diaphoresis and fatigue.  HENT: Negative.   Eyes: Negative.   Respiratory: Negative for cough, shortness of breath and wheezing.   Cardiovascular: Negative for chest pain, palpitations and leg swelling.  Gastrointestinal: Positive for abdominal pain. Negative for constipation, diarrhea, nausea and vomiting.  Genitourinary: Positive for flank pain. Negative for difficulty urinating, dysuria and hematuria.  Musculoskeletal: Positive for arthralgias. Negative for back pain.       He complains that he injured his right shoulder a few weeks ago.  He has had anterior shoulder pain but normal ROM.  He is getting some symptom relief with the current meds.  Skin: Negative.  Negative for color change, pallor and rash.  Neurological: Negative.  Negative for dizziness and weakness.  Hematological: Negative for adenopathy. Does not bruise/bleed easily.  Psychiatric/Behavioral: Negative.     Objective:  BP 112/80 (BP Location: Left Arm, Patient Position: Sitting, Cuff Size: Large)   Pulse 71   Temp 98.5 F (36.9 C) (Oral)  Ht 5\' 8"  (1.727 m)   Wt 219 lb (99.3 kg)   SpO2 97%   BMI 33.30 kg/m   BP Readings from Last 3 Encounters:  02/23/19 112/80  09/15/18 124/80  06/30/18 120/80    Wt Readings from Last 3 Encounters:  02/23/19 219 lb (99.3 kg)  09/15/18 217 lb (98.4 kg)  06/30/18 214 lb (97.1 kg)    Physical Exam Vitals signs reviewed.  Constitutional:      General: He is not in acute distress.    Appearance: He is not ill-appearing, toxic-appearing or diaphoretic.  HENT:     Nose: Nose normal.     Mouth/Throat:     Pharynx: No oropharyngeal  exudate or posterior oropharyngeal erythema.  Eyes:     General: No scleral icterus.    Conjunctiva/sclera: Conjunctivae normal.  Neck:     Musculoskeletal: Normal range of motion. No neck rigidity or muscular tenderness.  Cardiovascular:     Rate and Rhythm: Normal rate and regular rhythm.     Heart sounds: No murmur. No gallop.   Pulmonary:     Effort: Pulmonary effort is normal. No respiratory distress.     Breath sounds: No stridor. No wheezing, rhonchi or rales.  Chest:     Comments: There is diffuse tenderness to palpation over the lower left left posterior and lateral rib cage Abdominal:     General: Abdomen is flat.     Palpations: Abdomen is soft. There is no hepatomegaly, splenomegaly or mass.     Tenderness: There is abdominal tenderness in the left upper quadrant. There is left CVA tenderness. There is no right CVA tenderness, guarding or rebound.     Hernia: No hernia is present.  Musculoskeletal: Normal range of motion.        General: No swelling.     Right shoulder: He exhibits tenderness (there is ttp over the biceps tendon). He exhibits normal range of motion, no bony tenderness, no swelling, no effusion, no crepitus, no deformity, no pain and no spasm.     Right lower leg: No edema.     Left lower leg: No edema.  Lymphadenopathy:     Cervical: No cervical adenopathy.  Skin:    General: Skin is warm and dry.  Neurological:     General: No focal deficit present.  Psychiatric:        Mood and Affect: Mood normal.        Behavior: Behavior normal.     Lab Results  Component Value Date   WBC 8.6 07/08/2018   HGB 14.7 07/08/2018   HCT 44.4 07/08/2018   PLT 253.0 07/08/2018   GLUCOSE 99 07/08/2018   CHOL 130 07/10/2017   TRIG 98 07/10/2017   HDL 41 07/10/2017   LDLCALC 69 07/10/2017   ALT 19 07/08/2018   AST 25 07/08/2018   NA 141 07/08/2018   K 4.2 07/08/2018   CL 103 07/08/2018   CREATININE 1.22 07/08/2018   BUN 18 07/08/2018   CO2 31 07/08/2018    TSH 1.10 07/02/2016   PSA 0.33 07/02/2016   HGBA1C 5.9 06/03/2017    Dg Abd Acute 2+v W 1v Chest  Result Date: 02/15/2019 CLINICAL DATA:  58 year old male with left flank pain EXAM: DG ABDOMEN ACUTE W/ 1V CHEST COMPARISON:  02/18/2018, 07/21/2015 FINDINGS: Chest: Cardiomediastinal silhouette within normal limits. No pneumothorax pleural effusion or confluent airspace disease. Abdomen: Gas within stomach small bowel and colon. No abnormal distention. Mild stool burden. No radiopaque foreign body.  No unexpected calcification or soft tissue density. No displaced fracture. Left apex scoliotic curvature of the lumbar spine, centered at L3. IMPRESSION: Chest: Negative for acute cardiopulmonary disease. Abdomen: No evidence of nephrolithiasis. Nonobstructive bowel gas pattern Electronically Signed   By: Corrie Mckusick D.O.   On: 02/15/2019 11:49    Assessment & Plan:   Veniamin was seen today for back pain.  Diagnoses and all orders for this visit:  Right shoulder injury, initial encounter- I have asked him to see sports medicine about this. -     Ambulatory referral to Sports Medicine  Chronic left flank pain- He has persistent left flank and left upper abdominal pain.  There is some tenderness to palpation.  Plain films were recently unremarkable.  His CBC, CMP, and urinalysis were also reassuring.  I am however concerned about an occult lesion like a rib fracture that may or may not be pathological, a mass in the lower lung/kidney/spleen, renal stone, or an aneurysm in the area.  I recommended that he undergo a CT of the abdomen and pelvis with contrast. -     CT Abdomen Pelvis W Contrast; Future   I am having Serita Sheller maintain his Glucosamine-Chondroit-Vit C-Mn (GLUCOSAMINE 1500 COMPLEX PO), rizatriptan, EPINEPHrine, Vitamin D (Ergocalciferol), Diclofenac Sodium, triamcinolone, pantoprazole, metoprolol succinate, rosuvastatin, allopurinol, and etodolac.  No orders of the defined types were  placed in this encounter.    Follow-up: No follow-ups on file.  Scarlette Calico, MD

## 2019-02-24 ENCOUNTER — Other Ambulatory Visit: Payer: Self-pay | Admitting: Internal Medicine

## 2019-02-24 ENCOUNTER — Encounter: Payer: Self-pay | Admitting: Internal Medicine

## 2019-02-24 DIAGNOSIS — I10 Essential (primary) hypertension: Secondary | ICD-10-CM

## 2019-02-24 NOTE — Patient Instructions (Signed)
Flank Pain, Adult  Flank pain is pain that is located on the side of the body between the upper abdomen and the back. This area is called the flank. The pain may occur over a short period of time (acute), or it may be long-term or recurring (chronic). It may be mild or severe. Flank pain can be caused by many things, including:  · Muscle soreness or injury.  · Kidney stones or kidney disease.  · Stress.  · A disease of the spine (vertebral disk disease).  · A lung infection (pneumonia).  · Fluid around the lungs (pulmonary edema).  · A skin rash caused by the chickenpox virus (shingles).  · Tumors that affect the back of the abdomen.  · Gallbladder disease.  Follow these instructions at home:    · Drink enough fluid to keep your urine clear or pale yellow.  · Rest as told by your health care provider.  · Take over-the-counter and prescription medicines only as told by your health care provider.  · Keep a journal to track what has caused your flank pain and what has made it feel better.  · Keep all follow-up visits as told by your health care provider. This is important.  Contact a health care provider if:  · Your pain is not controlled with medicine.  · You have new symptoms.  · Your pain gets worse.  · You have a fever.  · Your symptoms last longer than 2-3 days.  · You have trouble urinating or you are urinating very frequently.  Get help right away if:  · You have trouble breathing or you are short of breath.  · Your abdomen hurts or it is swollen or red.  · You have nausea or vomiting.  · You feel faint or you pass out.  · You have blood in your urine.  Summary  · Flank pain is pain that is located on the side of the body between the upper abdomen and the back.  · The pain may occur over a short period of time (acute), or it may be long-term or recurring (chronic). It may be mild or severe.  · Flank pain can be caused by many things.  · Contact your health care provider if your symptoms get worse or they last  longer than 2-3 days.  This information is not intended to replace advice given to you by your health care provider. Make sure you discuss any questions you have with your health care provider.  Document Released: 11/14/2005 Document Revised: 12/06/2016 Document Reviewed: 12/06/2016  Elsevier Interactive Patient Education © 2019 [employer]

## 2019-02-24 NOTE — Progress Notes (Signed)
[name] [place], [place] [address] Phone: [phone] Subjective:   I Mark [name] am serving as a Education administrator for Dr. Hulan Saas.   CC: right shoulder pain   [id number]  Mark [name] is a 58 y.o. male coming in with complaint of R ant. shoulder pain.  Pt states that he was doing his typical 200-400 push-ups per night when he heard and felt a pop in his R anterior shoulder along the proximal biceps.  Pain is radiating down into his R upper arm and along his R clavicle.  Pt also notes a "knot" in his R upper arm/shoulder area but no bruising or swelling is noted in the area.  Pt reports history of a R biceps "repair" approximately 18 years prior. States that it feels the same as when he had to have his bicep repaired. Feels like his shoulder isn't stable.   Onset- approximately one week ago Location - R anterior shoulder radiating into R upper arm and along R clavicle, deltoid  Character- Burning/fire-like pain Aggravating factors- has to drive with it lifted up, can't hold a book. Initially couldn't lift above his head Therapies tried- anti-inflammatories and ice, tumeric Severity- Severe when the R arm hangs unsupported     Past Medical History:  Diagnosis Date  . Frequent headaches   . GERD (gastroesophageal reflux disease)   . H/O cardiac arrhythmia   . H/O phlebitis   . Heart murmur   . History of chicken pox   . Hyperlipidemia   . Hypertension    Past Surgical History:  Procedure Laterality Date  . HERNIA REPAIR    . Left elbow radial collateral ligament repair  09/2012   Social History   Socioeconomic History  . Marital status: Married    Spouse name: Not on file  . Number of children: Not on file  . Years of education: Not on file  . Highest education level: Not on file  Occupational History  . Not on file  Social Needs  . Financial resource strain: Not on file  . Food insecurity:    Worry: Not on file   Inability: Not on file  . Transportation needs:    Medical: Not on file    Non-medical: Not on file  Tobacco Use  . Smoking status: Never Smoker  . Smokeless tobacco: Never Used  Substance and Sexual Activity  . Alcohol use: No  . Drug use: No  . Sexual activity: Yes  Lifestyle  . Physical activity:    Days per week: Not on file    Minutes per session: Not on file  . Stress: Not on file  Relationships  . Social connections:    Talks on phone: Not on file    Gets together: Not on file    Attends religious service: Not on file    Active member of club or organization: Not on file    Attends meetings of clubs or organizations: Not on file    Relationship status: Not on file  Other Topics Concern  . Not on file  Social History Narrative  . Not on file   Allergies  Allergen Reactions  . Cold Buster [Zinc] Hives   Family History  Problem Relation Age of Onset  . Lung cancer Mother   . Arthritis Mother   . Hyperlipidemia Mother   . Hypertension Mother   . Alcohol abuse Father   . Heart disease Father   . Prostate cancer Father   .  Hyperlipidemia Father   . Stroke Father   . Hypertension Father   . Heart attack Father   . Alcohol abuse Sister   . Drug abuse Sister   . Alcohol abuse Brother   . Heart attack Brother   . Anemia Paternal Grandfather      Current Outpatient Medications (Cardiovascular):  Marland Kitchen  EPINEPHrine 0.3 mg/0.3 mL IJ SOAJ injection, Inject 0.3 mg into the muscle as needed (ANAPHYLAXIS). Marland Kitchen  metoprolol succinate (TOPROL-XL) 50 MG 24 hr tablet, TAKE 1 TABLET BY MOUTH  DAILY WITH OR IMMEDIATELY  FOLLOWING A MEAL .  rosuvastatin (CRESTOR) 10 MG tablet, TAKE 1 TABLET BY MOUTH  DAILY .  nitroGLYCERIN (NITRODUR - DOSED IN MG/24 HR) 0.2 mg/hr patch, 1/4 patch daily  Current Outpatient Medications (Respiratory):  .  triamcinolone (NASACORT) 55 MCG/ACT AERO nasal inhaler, Place 2 sprays into the nose daily.  Current Outpatient Medications (Analgesics):  .   allopurinol (ZYLOPRIM) 300 MG tablet, TAKE 1 TABLET BY MOUTH  DAILY .  etodolac (LODINE XL) 400 MG 24 hr tablet, Take 1 tablet (400 mg total) by mouth daily. .  rizatriptan (MAXALT) 10 MG tablet, Take 1 tablet (10 mg total) by mouth as needed for migraine. May repeat in 2 hours if needed   Current Outpatient Medications (Other):  Marland Kitchen  Diclofenac Sodium (PENNSAID) 2 % SOLN, Place 2 g onto the skin 2 (two) times daily. .  Glucosamine-Chondroit-Vit C-Mn (GLUCOSAMINE 1500 COMPLEX PO), Take 1 tablet by mouth daily.  .  pantoprazole (PROTONIX) 40 MG tablet, TAKE 1 TABLET BY MOUTH  DAILY .  Vitamin D, Ergocalciferol, (DRISDOL) 50000 units CAPS capsule, Take 1 capsule (50,000 Units total) by mouth every 7 (seven) days. Marland Kitchen  gabapentin (NEURONTIN) 100 MG capsule, Take 2 capsules (200 mg total) by mouth at bedtime. .  traZODone (DESYREL) 50 MG tablet, Take 0.5-1 tablets (25-50 mg total) by mouth at bedtime as needed for sleep.    Past medical history, social, surgical and family history all reviewed in electronic medical record.  No pertanent information unless stated regarding to the chief complaint.   Review of Systems:  No headache, visual changes, nausea, vomiting, diarrhea, constipation, dizziness, abdominal pain, skin rash, fevers, chills, night sweats, weight loss, swollen lymph nodes, body aches, joint swelling, muscle aches, chest pain, shortness of breath, mood changes.  Positive muscle aches  Objective  Blood pressure 136/80, pulse 69, height 5\' 8"  (1.727 m), weight 220 lb (99.8 kg), SpO2 97 %.   General: No apparent distress alert and oriented x3 mood and affect normal, dressed appropriately.  HEENT: Pupils equal, extraocular movements intact  Respiratory: Patient's speak in full sentences and does not appear short of breath  Cardiovascular: No lower extremity edema, non tender, no erythema  Skin: Warm dry intact with no signs of infection or rash on extremities or on axial skeleton.   Abdomen: Soft nontender  Neuro: Cranial nerves II through XII are intact, neurovascularly intact in all extremities with 2+ DTRs and 2+ pulses.  Lymph: No lymphadenopathy of posterior or anterior cervical chain or axillae bilaterally.  Gait normal with good balance and coordination.  MSK:  Non tender with full range of motion and good stability and symmetric strength and tone of , elbows, wrist, hip, knee and ankles bilaterally.   Surgical intervention of multiple joints.  Patient does have previous shoulder surgery on that side.  Patient does have positive impingement.  4+ strength as compared to the contralateral side.  Grip strength intact.  Near full range of motion though.  MSK US performed of: right shoulder This study was ordered, performed, and interpreted by Charlann Boxer D.O.  Shoulder:   Supraspinatus:  Appears normal on long and transverse views, no bursal bulge seen with shoulder abduction on impingement view patient does have what appears to be surgical changes. Infraspinatus: Intrasubstance tear noted no significant retraction Subscapularis: Acute tear noted.  Also a partial tear.  Is high-grade though. AC joint:  Capsule undistended, no geyser sign. Glenohumeral Joint: Mild arthritis Glenoid Labrum:  Intact without visualized tears. Biceps Tendon:  Appears normal on long and transverse views, no fraying of tendon, tendon located in intertubercular groove, no subluxation with shoulder internal or external rotation. No increased power doppler signal. Impression: Acute subscapularis and infraspinatus tears  97110; 15 additional minutes spent for Therapeutic exercises as stated in above notes.  This included exercises focusing on stretching, strengthening, with significant focus on eccentric aspects.   Long term goals include an improvement in range of motion, strength, endurance as well as avoiding reinjury. Patient's frequency would include in 1-2 times a day, 3-5 times a week for a  duration of 6-12 weeks. Shoulder Exercises that included:  Basic scapular stabilization to include adduction and depression of scapula Scaption, focusing on proper movement and good control Internal and External rotation utilizing a theraband, with elbow tucked at side entire time Rows with theraband which was given   Proper technique shown and discussed handout in great detail with ATC.  All questions were discussed and answered.     Impression and Recommendations:     This case required medical decision making of moderate complexity. The above documentation has been reviewed and is accurate and complete Lyndal Pulley, DO       Note: This dictation was prepared with Dragon dictation along with smaller phrase technology. Any transcriptional errors that result from this process are unintentional.

## 2019-02-25 ENCOUNTER — Encounter: Payer: Self-pay | Admitting: Family Medicine

## 2019-02-25 ENCOUNTER — Other Ambulatory Visit: Payer: Self-pay

## 2019-02-25 ENCOUNTER — Ambulatory Visit: Payer: Self-pay

## 2019-02-25 ENCOUNTER — Ambulatory Visit (INDEPENDENT_AMBULATORY_CARE_PROVIDER_SITE_OTHER): Payer: Managed Care, Other (non HMO) | Admitting: Family Medicine

## 2019-02-25 VITALS — BP 136/80 | HR 69 | Ht 68.0 in | Wt 220.0 lb

## 2019-02-25 DIAGNOSIS — M75111 Incomplete rotator cuff tear or rupture of right shoulder, not specified as traumatic: Secondary | ICD-10-CM | POA: Insufficient documentation

## 2019-02-25 DIAGNOSIS — S46011A Strain of muscle(s) and tendon(s) of the rotator cuff of right shoulder, initial encounter: Secondary | ICD-10-CM

## 2019-02-25 DIAGNOSIS — M25511 Pain in right shoulder: Secondary | ICD-10-CM | POA: Diagnosis not present

## 2019-02-25 DIAGNOSIS — G8929 Other chronic pain: Secondary | ICD-10-CM

## 2019-02-25 MED ORDER — GABAPENTIN 100 MG PO CAPS: 200.0000 mg | ORAL_CAPSULE | Freq: Every day | ORAL | 3 refills | Status: DC

## 2019-02-25 MED ORDER — TRAZODONE HCL 50 MG PO TABS: 25.0000 mg | ORAL_TABLET | Freq: Every evening | ORAL | 3 refills | Status: DC | PRN

## 2019-02-25 MED ORDER — NITROGLYCERIN 0.2 MG/HR TD PT24: MEDICATED_PATCH | TRANSDERMAL | 1 refills | Status: DC

## 2019-02-25 NOTE — Assessment & Plan Note (Signed)
Partial rotator cuff tear noted.  This is mostly the subscapularis as well as the infraspinatus.  Does have some reactive bursitis also noted.  Gabapentin given secondary to the nerve pain.  Already on an oral anti-inflammatory.  Nitroglycerin patches given and warned of potential side effects.  Patient is in agreement we will monitor.  Patient has a history of SVT but should do well and has had.  History of headaches.  Patient will only wear the nitroglycerin during the day.  We did not really address this before patient left today.  Patient will follow-up again in 4 weeks and has been sent to formal physical therapy.

## 2019-02-25 NOTE — Patient Instructions (Signed)
Good to see you  Luckily it should be a partial tear Ice 20 minutes 2 times daily. Usually after activity and before bed. PT will call you  Keep hands within peripheral visoin  Gabapentin 200mg  at night Trazadone at night if really having trouble sleeping  Nitroglycerin Protocol   Apply 1/4 nitroglycerin patch to affected area daily.  Change position of patch within the affected area every 24 hours.  You may experience a headache during the first 1-2 weeks of using the patch, these should subside.  If you experience headaches after beginning nitroglycerin patch treatment, you may take your preferred over the counter pain reliever.  Another side effect of the nitroglycerin patch is skin irritation or rash related to patch adhesive.  Please notify our office if you develop more severe headaches or rash, and stop the patch.  Tendon healing with nitroglycerin patch may require 12 to 24 weeks depending on the extent of injury.  Men should not use if taking Viagra, Cialis, or Levitra.   Do not use if you have migraines or rosacea.  See me again in 4ish weeks.  Be safe

## 2019-02-26 ENCOUNTER — Other Ambulatory Visit (INDEPENDENT_AMBULATORY_CARE_PROVIDER_SITE_OTHER): Payer: Managed Care, Other (non HMO)

## 2019-02-26 ENCOUNTER — Telehealth: Payer: Self-pay | Admitting: *Deleted

## 2019-02-26 DIAGNOSIS — I10 Essential (primary) hypertension: Secondary | ICD-10-CM

## 2019-02-26 LAB — BASIC METABOLIC PANEL
BUN: 17 mg/dL (ref 6–23)
CO2: 28 mEq/L (ref 19–32)
Calcium: 9 mg/dL (ref 8.4–10.5)
Chloride: 104 mEq/L (ref 96–112)
Creatinine, Ser: 1.21 mg/dL (ref 0.40–1.50)
GFR: 61.4 mL/min (ref >60.00)
Glucose, Bld: 126 mg/dL — ABNORMAL HIGH (ref 70–99)
Potassium: 4.4 mEq/L (ref 3.5–5.1)
Sodium: 140 mEq/L (ref 135–145)

## 2019-02-26 NOTE — Telephone Encounter (Signed)

## 2019-03-02 ENCOUNTER — Encounter: Payer: Self-pay | Admitting: Internal Medicine

## 2019-03-02 ENCOUNTER — Other Ambulatory Visit: Payer: Self-pay

## 2019-03-02 ENCOUNTER — Ambulatory Visit (INDEPENDENT_AMBULATORY_CARE_PROVIDER_SITE_OTHER)
Admission: RE | Admit: 2019-03-02 | Discharge: 2019-03-02 | Disposition: A | Discharge status: Home / Self Care | Payer: Managed Care, Other (non HMO) | Source: Ambulatory Visit | Attending: Internal Medicine | Admitting: Internal Medicine

## 2019-03-02 DIAGNOSIS — R109 Unspecified abdominal pain: Secondary | ICD-10-CM | POA: Diagnosis not present

## 2019-03-02 DIAGNOSIS — G8929 Other chronic pain: Secondary | ICD-10-CM

## 2019-03-02 MED ORDER — IOHEXOL 300 MG/ML  SOLN
100.0000 mL | Freq: Once | INTRAMUSCULAR | Status: AC | PRN
  Administered 2019-03-02: 100 mL via INTRAVENOUS

## 2019-03-08 ENCOUNTER — Other Ambulatory Visit: Payer: Self-pay | Admitting: Internal Medicine

## 2019-03-08 ENCOUNTER — Encounter: Payer: Self-pay | Admitting: Internal Medicine

## 2019-03-08 DIAGNOSIS — M546 Pain in thoracic spine: Secondary | ICD-10-CM | POA: Insufficient documentation

## 2019-03-16 ENCOUNTER — Encounter: Payer: Self-pay | Admitting: Internal Medicine

## 2019-03-17 ENCOUNTER — Encounter: Payer: Self-pay | Admitting: Family Medicine

## 2019-03-19 ENCOUNTER — Encounter: Payer: Self-pay | Admitting: Internal Medicine

## 2019-03-19 NOTE — Telephone Encounter (Signed)
I have spoken to pt regarding this

## 2019-03-24 ENCOUNTER — Telehealth: Payer: Self-pay | Admitting: Internal Medicine

## 2019-03-24 NOTE — Telephone Encounter (Signed)
Mark [name] - can you see him about this?  TJ

## 2019-03-24 NOTE — Telephone Encounter (Signed)
-----   Message from Octavio Manns sent at 03/19/2019  3:45 PM EDT ----- FYI:  Insurance stated they will not approve MRI without 6 wks of conventional treatment and wanted me to cancel request until time has been reached.   Spoke to pt and he is going to call insurance and talk with them about it and he will let me know how it turns out.   I did suggest referral to orthopedic or to see Dr. Tamala Julian again. He wasn't sure which way to go and I was unable to advise him.   Thanks  Cecille Rubin   Update:  Pt spoke to insurance and I sent them the notes, but will more than likely go into peer to peer. Pt understands that's it will probably not be approved.  I am going to try to get him in with Dr. Tamala Julian next week.

## 2019-03-24 NOTE — Progress Notes (Signed)
[name] [name], [name] [address] Phone: [phone] Subjective:      CC: Right shoulder low back pain  [id number]  Mark [name] is a 58 y.o. male coming in with complaint of right shoulder pain.  Patient was found to have a partial tear of the rotator cuff.  Making significant progress he states.  Patient denies any significant radiation down the arm.  Minimal weakness.  Able to do daily activities better.  Patient is more concerned of the lower back pain.  Left side.  Worsening.  Patient has been having worsening symptoms for quite some time and is going on 2 months at this point.  Patient was sent initially because it seems to be radiating to the abdominal area and had an ultrasound of the abdominal, then had a CT abdomen pelvis that was independently visualized by me.  CT scan was unremarkable.  Past medical history of lower back has been severe over the course of time.  Patient has had multiple high impact injuries to the back. Patient feels like it is actually truly radiating more to the lower leg from time to time.  Feels like he has weakness with the left leg.    Past Medical History:  Diagnosis Date  . Frequent headaches   . GERD (gastroesophageal reflux disease)   . H/O cardiac arrhythmia   . H/O phlebitis   . Heart murmur   . History of chicken pox   . Hyperlipidemia   . Hypertension    Past Surgical History:  Procedure Laterality Date  . HERNIA REPAIR    . Left elbow radial collateral ligament repair  09/2012   Social History   Socioeconomic History  . Marital status: Married    Spouse name: Not on file  . Number of children: Not on file  . Years of education: Not on file  . Highest education level: Not on file  Occupational History  . Not on file  Social Needs  . Financial resource strain: Not on file  . Food insecurity    Worry: Not on file    Inability: Not on file  . Transportation needs   Medical: Not on file    Non-medical: Not on file  Tobacco Use  . Smoking status: Never Smoker  . Smokeless tobacco: Never Used  Substance and Sexual Activity  . Alcohol use: No  . Drug use: No  . Sexual activity: Yes  Lifestyle  . Physical activity    Days per week: Not on file    Minutes per session: Not on file  . Stress: Not on file  Relationships  . Social Herbalist on phone: Not on file    Gets together: Not on file    Attends religious service: Not on file    Active member of club or organization: Not on file    Attends meetings of clubs or organizations: Not on file    Relationship status: Not on file  Other Topics Concern  . Not on file  Social History Narrative  . Not on file   Allergies  Allergen Reactions  . Cold Buster [Zinc] Hives   Family History  Problem Relation Age of Onset  . Lung cancer Mother   . Arthritis Mother   . Hyperlipidemia Mother   . Hypertension Mother   . Alcohol abuse Father   . Heart disease Father   . Prostate cancer Father   . Hyperlipidemia Father   .  Stroke Father   . Hypertension Father   . Heart attack Father   . Alcohol abuse Sister   . Drug abuse Sister   . Alcohol abuse Brother   . Heart attack Brother   . Anemia Paternal Grandfather      Current Outpatient Medications (Cardiovascular):  Marland Kitchen  EPINEPHrine 0.3 mg/0.3 mL IJ SOAJ injection, Inject 0.3 mg into the muscle as needed (ANAPHYLAXIS). Marland Kitchen  metoprolol succinate (TOPROL-XL) 50 MG 24 hr tablet, TAKE 1 TABLET BY MOUTH  DAILY WITH OR IMMEDIATELY  FOLLOWING A MEAL .  nitroGLYCERIN (NITRODUR - DOSED IN MG/24 HR) 0.2 mg/hr patch, 1/4 patch daily .  rosuvastatin (CRESTOR) 10 MG tablet, TAKE 1 TABLET BY MOUTH  DAILY  Current Outpatient Medications (Respiratory):  .  triamcinolone (NASACORT) 55 MCG/ACT AERO nasal inhaler, Place 2 sprays into the nose daily.  Current Outpatient Medications (Analgesics):  .  allopurinol (ZYLOPRIM) 300 MG tablet, TAKE 1 TABLET BY  MOUTH  DAILY .  etodolac (LODINE XL) 400 MG 24 hr tablet, Take 1 tablet (400 mg total) by mouth daily. .  rizatriptan (MAXALT) 10 MG tablet, Take 1 tablet (10 mg total) by mouth as needed for migraine. May repeat in 2 hours if needed   Current Outpatient Medications (Other):  Marland Kitchen  Diclofenac Sodium (PENNSAID) 2 % SOLN, Place 2 g onto the skin 2 (two) times daily. Marland Kitchen  gabapentin (NEURONTIN) 100 MG capsule, Take 2 capsules (200 mg total) by mouth at bedtime. .  Glucosamine-Chondroit-Vit C-Mn (GLUCOSAMINE 1500 COMPLEX PO), Take 1 tablet by mouth daily.  .  pantoprazole (PROTONIX) 40 MG tablet, TAKE 1 TABLET BY MOUTH  DAILY .  traZODone (DESYREL) 50 MG tablet, Take 0.5-1 tablets (25-50 mg total) by mouth at bedtime as needed for sleep. .  Vitamin D, Ergocalciferol, (DRISDOL) 50000 units CAPS capsule, Take 1 capsule (50,000 Units total) by mouth every 7 (seven) days.    Past medical history, social, surgical and family history all reviewed in electronic medical record.  No pertanent information unless stated regarding to the chief complaint.   Review of Systems:  No headache, visual changes, nausea, vomiting, diarrhea, constipation, dizziness, abdominal pain, skin rash, fevers, chills, night sweats, weight loss, swollen lymph nodes, body aches, joint swelling, muscle aches, chest pain, shortness of breath, mood changes.   Objective  Blood pressure 130/74, height 5\' 8"  (1.727 m), weight 219 lb (99.3 kg).    General: No apparent distress alert and oriented x3 mood and affect normal, dressed appropriately.  HEENT: Pupils equal, extraocular movements intact  Respiratory: Patient's speak in full sentences and does not appear short of breath  Cardiovascular: No lower extremity edema, non tender, no erythema  Skin: Warm dry intact with no signs of infection or rash on extremities or on axial skeleton.  Abdomen: Soft nontender  Neuro: Cranial nerves II through XII are intact, neurovascularly intact in  all extremities with  2+ pulses.  Lymph: No lymphadenopathy of posterior or anterior cervical chain or axillae bilaterally.  Gait antalgic MSK:  Non tender with full range of motion and good stability and symmetric strength and tone of  elbows, wrist, hip, knee and ankles bilaterally.  Right shoulder exam is doing significantly better at this time.  Patient has improvement in range of motion.  4+ out of 5 strength compared to contralateral side.  Minimal discomfort with range of motion testing or strength testing.  Low back exam shows the patient is favoring the left side of his back at this  time.  Severely positive straight leg test and 20 degrees of forward flexion.  With radicular symptoms all the way to the foot.  Patient has 4-5 strength of dorsiflexion and plantarflexion on the left side compared to the contralateral side.  1+ deep tendon reflexes on the left compared to the right   Impression and Recommendations:     This case required medical decision making of moderate complexity. The above documentation has been reviewed and is accurate and complete Lyndal Pulley, DO       Note: This dictation was prepared with Dragon dictation along with smaller phrase technology. Any transcriptional errors that result from this process are unintentional.

## 2019-03-25 ENCOUNTER — Ambulatory Visit: Payer: Managed Care, Other (non HMO) | Admitting: Family Medicine

## 2019-03-25 ENCOUNTER — Other Ambulatory Visit: Payer: Self-pay

## 2019-03-25 ENCOUNTER — Encounter: Payer: Self-pay | Admitting: Family Medicine

## 2019-03-25 VITALS — BP 130/74 | HR 62 | Ht 68.0 in | Wt 219.0 lb

## 2019-03-25 DIAGNOSIS — S46011D Strain of muscle(s) and tendon(s) of the rotator cuff of right shoulder, subsequent encounter: Secondary | ICD-10-CM

## 2019-03-25 DIAGNOSIS — M5416 Radiculopathy, lumbar region: Secondary | ICD-10-CM

## 2019-03-25 DIAGNOSIS — M545 Low back pain, unspecified: Secondary | ICD-10-CM

## 2019-03-25 NOTE — Assessment & Plan Note (Signed)
Patient doing significantly better.  No change in management.

## 2019-03-25 NOTE — Assessment & Plan Note (Signed)
Patient is having severe pain with now weakness in the left lower extremity.  I do believe it secondary to more of the lumbar radiculopathy.  I do think that patient's thoracic pain seem to be closer to more of L2-L3 region.  Having some mild radiation in the abdomen area.  Patient already had work-up with a CT abdomen that was unremarkable.  Patient is no longer having the abdominal pain and seems to have more of the radiation of the pain and the weakness of the lower extremity that makes me concerned for herniated disc and patient would be a candidate for epidural injections.  I would like patient to have an MRI.  This is affecting daily activities significantly.  It is also affecting his work.  Patient knows if worsening symptoms he is to seek medical attention immediately even if that means going to the emergency room.  Patient declined any type of pain medications today.  Follow-up with me again evaluation and treatment.  Spent  25 minutes with patient face-to-face and had greater than 50% of counseling including as described above in assessment and plan.

## 2019-03-25 NOTE — Patient Instructions (Signed)
Good to see you  We will get MRI and I wil write you  Call [phone] to schedule after lunch

## 2019-04-01 ENCOUNTER — Encounter: Payer: Self-pay | Admitting: Family Medicine

## 2019-04-02 ENCOUNTER — Encounter: Payer: Self-pay | Admitting: Family Medicine

## 2019-04-05 ENCOUNTER — Other Ambulatory Visit: Payer: Self-pay | Admitting: Family Medicine

## 2019-04-05 ENCOUNTER — Ambulatory Visit
Admission: RE | Admit: 2019-04-05 | Discharge: 2019-04-05 | Disposition: A | Discharge status: Home / Self Care | Payer: Self-pay | Source: Ambulatory Visit | Attending: Family Medicine | Admitting: Family Medicine

## 2019-04-05 ENCOUNTER — Encounter: Payer: Self-pay | Admitting: Family Medicine

## 2019-04-05 ENCOUNTER — Other Ambulatory Visit: Payer: Managed Care, Other (non HMO)

## 2019-04-05 ENCOUNTER — Inpatient Hospital Stay: Admission: RE | Admit: 2019-04-05 | Payer: Self-pay | Source: Ambulatory Visit

## 2019-04-05 ENCOUNTER — Other Ambulatory Visit: Payer: Self-pay

## 2019-04-05 ENCOUNTER — Ambulatory Visit
Admission: RE | Admit: 2019-04-05 | Discharge: 2019-04-05 | Disposition: A | Discharge status: Home / Self Care | Payer: Self-pay | Source: Ambulatory Visit

## 2019-04-05 DIAGNOSIS — M545 Low back pain, unspecified: Secondary | ICD-10-CM

## 2019-04-06 ENCOUNTER — Other Ambulatory Visit: Payer: Self-pay

## 2019-04-06 ENCOUNTER — Encounter: Payer: Self-pay | Admitting: Family Medicine

## 2019-04-06 DIAGNOSIS — M5416 Radiculopathy, lumbar region: Secondary | ICD-10-CM

## 2019-04-22 ENCOUNTER — Other Ambulatory Visit: Payer: Self-pay

## 2019-04-22 ENCOUNTER — Ambulatory Visit
Admission: RE | Admit: 2019-04-22 | Discharge: 2019-04-22 | Disposition: A | Discharge status: Home / Self Care | Payer: Self-pay | Source: Ambulatory Visit | Attending: Family Medicine | Admitting: Family Medicine

## 2019-04-22 DIAGNOSIS — M5416 Radiculopathy, lumbar region: Secondary | ICD-10-CM

## 2019-04-22 MED ORDER — METHYLPREDNISOLONE ACETATE 40 MG/ML INJ SUSP (RADIOLOG
120.0000 mg | Freq: Once | INTRAMUSCULAR | Status: AC
  Administered 2019-04-22: 120 mg via EPIDURAL

## 2019-04-22 MED ORDER — IOPAMIDOL (ISOVUE-M 200) INJECTION 41%
1.0000 mL | Freq: Once | INTRAMUSCULAR | Status: AC
  Administered 2019-04-22: 08:00:00 1 mL via EPIDURAL

## 2019-04-22 NOTE — Discharge Instructions (Signed)

## 2019-05-20 IMAGING — DX DG CHEST 2V
2 series · 2 of 2 positions shown · non-contrast
Comparison: Prior radiograph from 07/21/2015.

CLINICAL DATA: Initial evaluation for acute cough, wheeze.

EXAM:
CHEST - 2 VIEW

[chest pa]
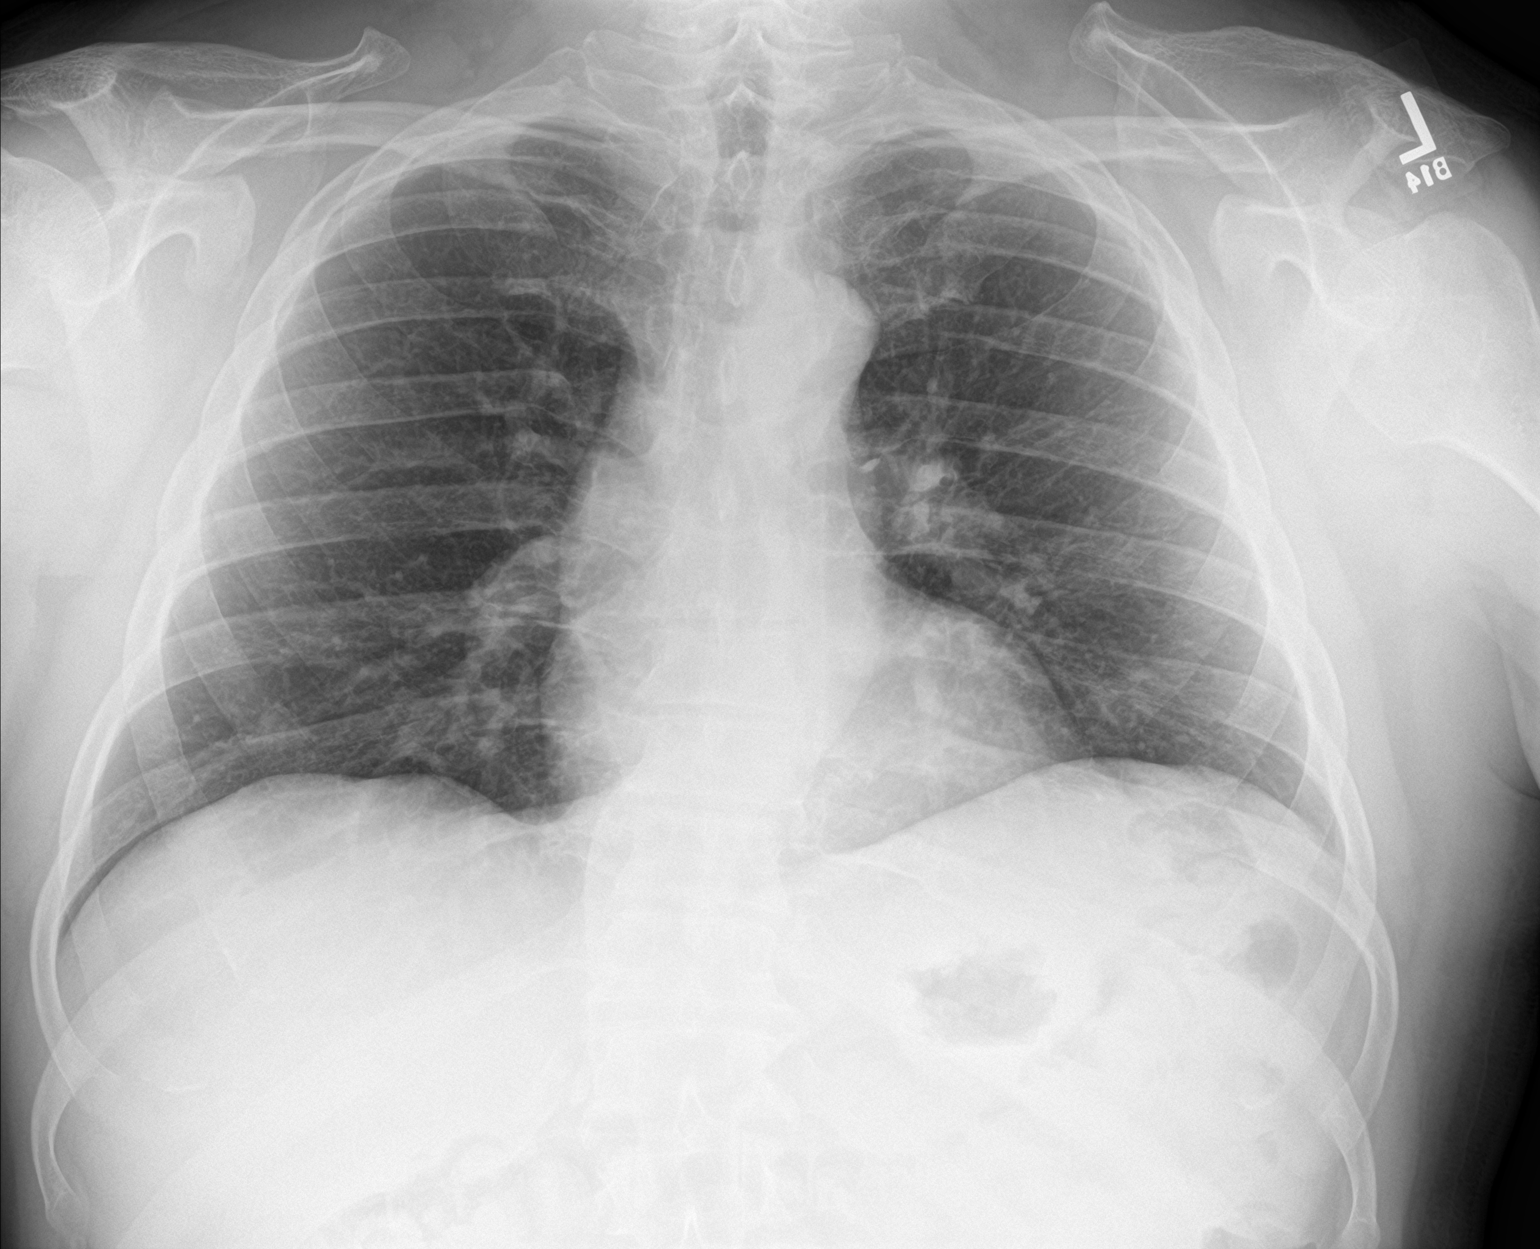

[chest lat]
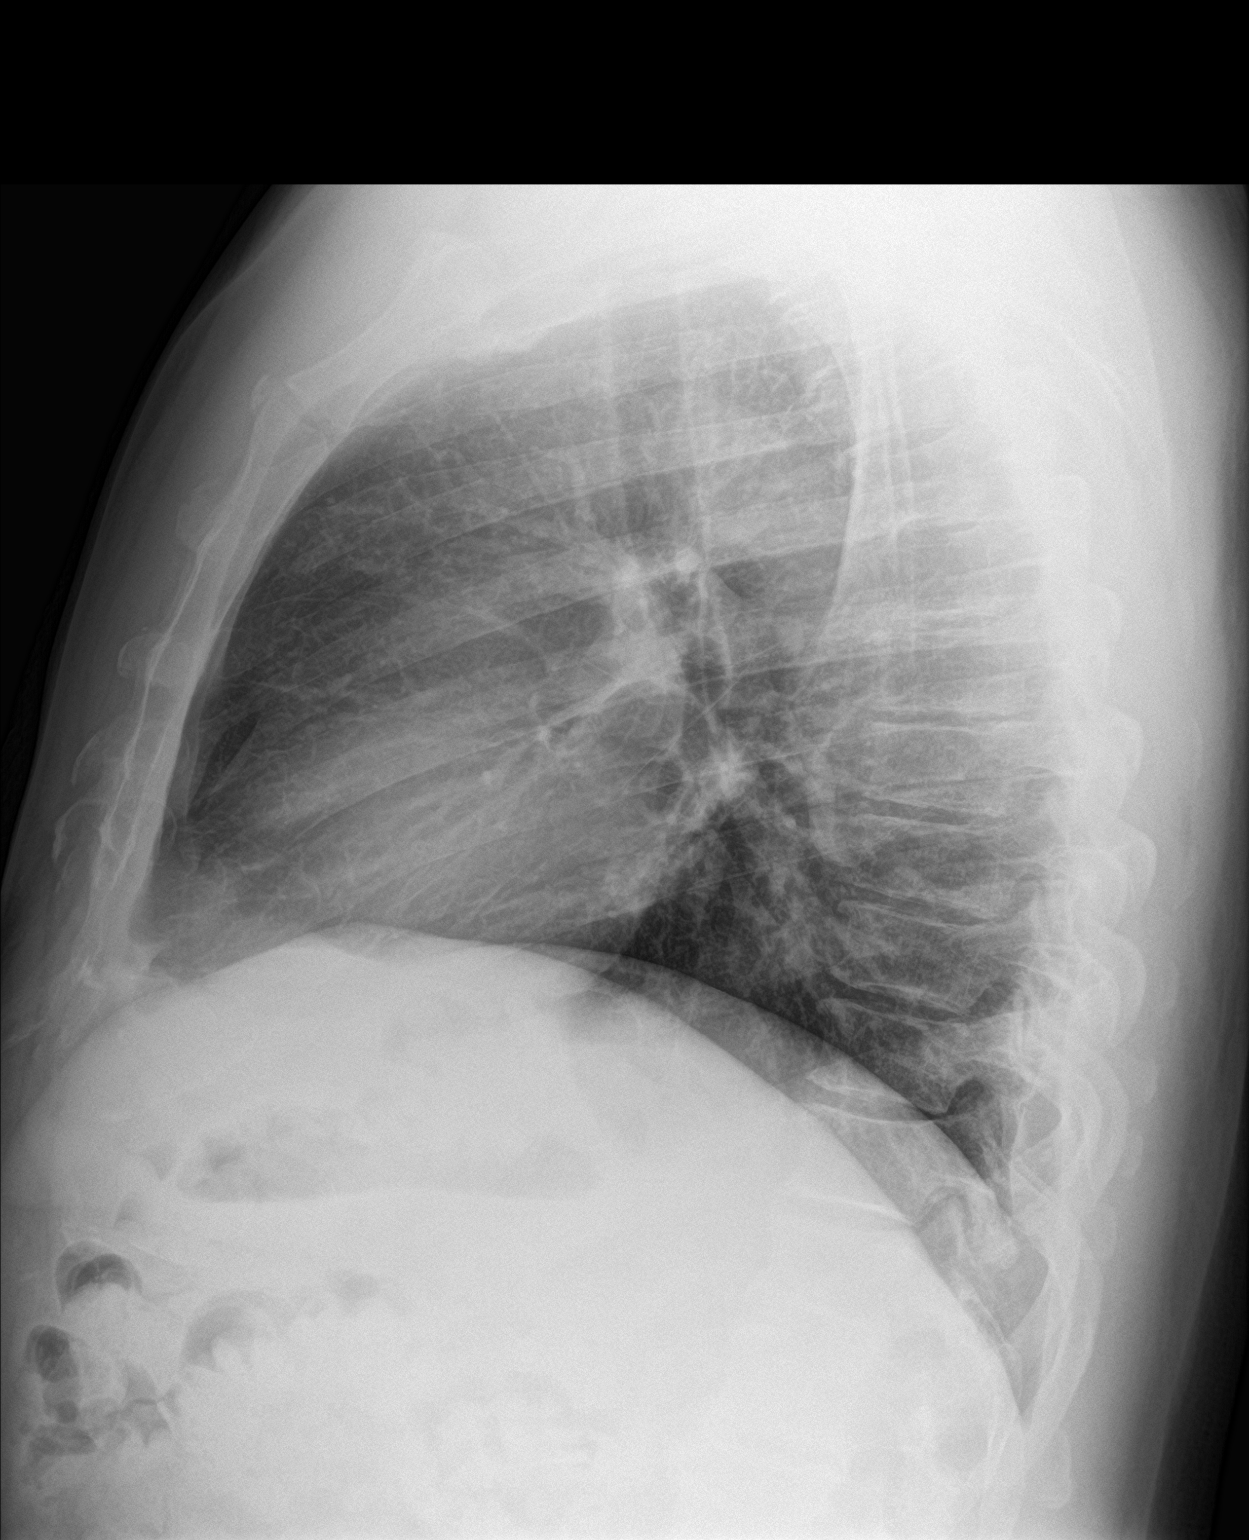

[2 of 2 positions shown; findings below may reference images not displayed]

FINDINGS: The cardiac and mediastinal silhouettes are stable in size and
contour, and remain within normal limits.

The lungs are hypoinflated with elevation of the left hemidiaphragm,
stable. No airspace consolidation, pleural effusion, or pulmonary
edema is identified. There is no pneumothorax.

No acute osseous abnormality identified.
IMPRESSION: No active cardiopulmonary disease.

## 2019-07-01 ENCOUNTER — Other Ambulatory Visit: Payer: Self-pay | Admitting: Internal Medicine

## 2019-07-01 ENCOUNTER — Other Ambulatory Visit: Payer: Self-pay | Admitting: Family Medicine

## 2019-07-01 DIAGNOSIS — I10 Essential (primary) hypertension: Secondary | ICD-10-CM

## 2019-07-01 DIAGNOSIS — I471 Supraventricular tachycardia: Secondary | ICD-10-CM

## 2019-07-01 DIAGNOSIS — E785 Hyperlipidemia, unspecified: Secondary | ICD-10-CM

## 2019-07-01 DIAGNOSIS — M1000 Idiopathic gout, unspecified site: Secondary | ICD-10-CM

## 2019-07-01 DIAGNOSIS — K219 Gastro-esophageal reflux disease without esophagitis: Secondary | ICD-10-CM

## 2019-08-04 ENCOUNTER — Other Ambulatory Visit: Payer: Self-pay | Admitting: Podiatry

## 2019-08-04 ENCOUNTER — Other Ambulatory Visit: Payer: Self-pay

## 2019-08-04 ENCOUNTER — Ambulatory Visit (INDEPENDENT_AMBULATORY_CARE_PROVIDER_SITE_OTHER): Payer: 59

## 2019-08-04 ENCOUNTER — Ambulatory Visit: Payer: 59 | Admitting: Podiatry

## 2019-08-04 DIAGNOSIS — M722 Plantar fascial fibromatosis: Secondary | ICD-10-CM

## 2019-08-04 DIAGNOSIS — M7731 Calcaneal spur, right foot: Secondary | ICD-10-CM

## 2019-08-04 DIAGNOSIS — M7661 Achilles tendinitis, right leg: Secondary | ICD-10-CM

## 2019-08-04 NOTE — Patient Instructions (Signed)
Pre-Operative Instructions  Congratulations, you have decided to take an important step towards improving your quality of life.  You can be assured that the doctors and staff at [facility] will be with you every step of the way.  Here are some important things you should know:  1. Plan to be at the surgery center/hospital at least 1 (one) hour prior to your scheduled time, unless otherwise directed by the surgical center/hospital staff.  You must have a responsible adult accompany you, remain during the surgery and drive you home.  Make sure you have directions to the surgical center/hospital to ensure you arrive on time. 2. If you are having surgery at [facility] or [facility] hospitals, you will need a copy of your medical history and physical form from your family physician within one month prior to the date of surgery. We will give you a form for your primary physician to complete.  3. We make every effort to accommodate the date you request for surgery.  However, there are times where surgery dates or times have to be moved.  We will contact you as soon as possible if a change in schedule is required.   4. No aspirin/ibuprofen for one week before surgery.  If you are on aspirin, any non-steroidal anti-inflammatory medications (Mobic, Aleve, Ibuprofen) should not be taken seven (7) days prior to your surgery.  You make take Tylenol for pain prior to surgery.  5. Medications - If you are taking daily heart and blood pressure medications, seizure, reflux, allergy, asthma, anxiety, pain or diabetes medications, make sure you notify the surgery center/hospital before the day of surgery so they can tell you which medications you should take or avoid the day of surgery. 6. No food or drink after midnight the night before surgery unless directed otherwise by surgical center/hospital staff. 7. No alcoholic beverages 24-hours prior to surgery.  No smoking 24-hours prior or 24-hours after  surgery. 8. Wear loose pants or shorts. They should be loose enough to fit over bandages, boots, and casts. 9. Don't wear slip-on shoes. Sneakers are preferred. 10. Bring your boot with you to the surgery center/hospital.  Also bring crutches or a walker if your physician has prescribed it for you.  If you do not have this equipment, it will be provided for you after surgery. 11. If you have not been contacted by the surgery center/hospital by the day before your surgery, call to confirm the date and time of your surgery. 12. Leave-time from work may vary depending on the type of surgery you have.  Appropriate arrangements should be made prior to surgery with your employer. 13. Prescriptions will be provided immediately following surgery by your doctor.  Fill these as soon as possible after surgery and take the medication as directed. Pain medications will not be refilled on weekends and must be approved by the doctor. 14. Remove nail polish on the operative foot and avoid getting pedicures prior to surgery. 15. Wash the night before surgery.  The night before surgery wash the foot and leg well with water and the antibacterial soap provided. Be sure to pay special attention to beneath the toenails and in between the toes.  Wash for at least three (3) minutes. Rinse thoroughly with water and dry well with a towel.  Perform this wash unless told not to do so by your physician.  Enclosed: 1 Ice pack (please put in freezer the night before surgery)   1 Hibiclens skin cleaner     Pre-op instructions  If you have any questions regarding the instructions, please do not hesitate to call our office.  [place]: [address], [place], [place] [address] -- [phone]  [place]: [address] , [place] [address] -- [phone]  [place]: [address]  [place], [place] [address] -- [phone]   Website: [email or web address] 

## 2019-08-08 NOTE — Progress Notes (Signed)
   HPI: 58 y.o. male presenting today as a new patient, referred by Dr. Gershon Mussel, with a chief complaint of right posterior plantar heel pain that began 2.5 years ago. He states he broke the heel initially when the pain began. He has been taking OTC medication and using custom insoles for treatment. Being on the foot increases the pain. Patient is here for further evaluation and treatment.   Past Medical History:  Diagnosis Date  . Frequent headaches   . GERD (gastroesophageal reflux disease)   . H/O cardiac arrhythmia   . H/O phlebitis   . Heart murmur   . History of chicken pox   . Hyperlipidemia   . Hypertension       Physical Exam: General: The patient is alert and oriented x3 in no acute distress.  Dermatology: Skin is warm, dry and supple bilateral lower extremities. Negative for open lesions or macerations.  Vascular: Palpable pedal pulses bilaterally. No edema or erythema noted. Capillary refill within normal limits.  Neurological: Epicritic and protective threshold grossly intact bilaterally.   Musculoskeletal Exam: Pain on palpation noted to the posterior tubercle of the right calcaneus at the insertion of the Achilles tendon consistent with retrocalcaneal bursitis. Range of motion within normal limits. Muscle strength 5/5 in all muscle groups bilateral lower extremities.  Radiographic Exam:  Posterior calcaneal spur noted to the respective calcaneus on lateral view. No fracture or dislocation noted. Normal osseous mineralization noted.     Assessment: 1. Achilles tendinitis right  2. Posterior heel spur / fragment right    Plan of Care:  1. Patient was evaluated. Radiographs were reviewed today. 2. Today we discussed the conservative versus surgical management of the presenting pathology. The patient opts for surgical management. All possible complications and details of the procedure were explained. All patient questions were answered. No guarantees were expressed or  implied. 3. Authorization for surgery was initiated today. Surgery will consist of retrocalcaneal exostectomy with repair of Achilles repair right.  4. Return to clinic one week post op.   Will work from office during post op.     Edrick Kins, DPM [facility]  Dr. Edrick Kins, [place]                                        [place], [place] [address]                Office [phone]  Fax [phone]

## 2019-08-20 ENCOUNTER — Telehealth: Payer: Self-pay | Admitting: *Deleted

## 2019-08-20 NOTE — Telephone Encounter (Signed)
"  I am scheduled for surgery on January 28.  I want to reschedule it before we get this shutdown that they are talking about due to Covid.  I'd rather be recuperating during the shutdown rather than taking the chance of not being able to do it.  Dr. Amalia Hailey doesn't have anything available until January.  "I'm not talking about this year, I still want to have it in January, just before January 20."  Dr. Amalia Hailey has one opening on October 21, 2019.  "Okay, I'll take it.  Is there anything else I need to do due to the rescheduling?"  No, if you haven't registered, you still need to do that.  "I'll change the date on the back of my brochure."  I called and left a message for Caren Griffins, Marketing executive at [facility] [employer].  I asked her to reschedule Mr. Mainer surgery date from 11/04/2019 to 10/21/2019.

## 2019-09-20 ENCOUNTER — Other Ambulatory Visit: Payer: Self-pay | Admitting: Internal Medicine

## 2019-09-20 DIAGNOSIS — I10 Essential (primary) hypertension: Secondary | ICD-10-CM

## 2019-09-20 DIAGNOSIS — I471 Supraventricular tachycardia: Secondary | ICD-10-CM

## 2019-09-20 DIAGNOSIS — M1000 Idiopathic gout, unspecified site: Secondary | ICD-10-CM

## 2019-10-05 ENCOUNTER — Telehealth: Payer: Self-pay

## 2019-10-05 NOTE — Telephone Encounter (Signed)
Patient is scheduled with Dr. Amalia Hailey for surgery on 10/21/2019. He has questions about his surgery, where he is supposed to get his covid test, etc. He also has questions about disability paperwork and how to get that completed Please call to advise Thanks Nira Conn

## 2019-10-05 NOTE — Telephone Encounter (Signed)
"  I'm having surgery with Dr. Amalia Hailey on the fourteenth.  I want to find out if I need to have a Covid test.  I didn't think about it at the time but my wife works at [employer] and reminded me I needed to check in with that.  Give me a call, I'd appreciate it because time is getting short here and I would need to know where to go to get it, if I need to get one.  I'd appreciate a phone call back."

## 2019-10-14 ENCOUNTER — Telehealth: Payer: Self-pay | Admitting: *Deleted

## 2019-10-14 NOTE — Telephone Encounter (Signed)
I called and left him a message that he will not need a Covid test.

## 2019-10-15 ENCOUNTER — Telehealth: Payer: Self-pay | Admitting: *Deleted

## 2019-10-15 NOTE — Telephone Encounter (Signed)
DOS 10/21/2019 REPAIR ACHILLES - 93594 AND CALCANEAL OSTECTOMY - 09050 RIGHT FOOT  UHC: Eligibility Date - 10/08/2019 - 10/06/2020  Plan Deductible Per Calendar Year $0.00 of $750.00 Met  Member's plan does not have an In-Network - Tier 1 individual out-of-pocket maximum.  Co-Insurance 25%  Pre-certification is NOT REQUIRED.  This Careers adviser does not currently require a prior authorization for these services. If you have general questions about the prior authorization requirements, please call us at [phone] or visit [email or web address] > Clinician Resources > Advance and Admission Notification Requirements. The number above acknowledges your notification. Please write this number down for future reference. Notification is not a guarantee of coverage or payment.  Decision ID #:[id number]

## 2019-10-19 ENCOUNTER — Telehealth: Payer: Self-pay | Admitting: Podiatry

## 2019-10-19 NOTE — Telephone Encounter (Signed)
I'm scheduled for sx with Dr. Amalia Hailey 10/21/19. Can you call me back and tell me the time I'm supposed to be there. I don't see it on my paperwork. Thank you.

## 2019-10-19 NOTE — Telephone Encounter (Signed)
I am returning your call.  "I was calling to see what time my surgery was going to be.  I couldn't find it anywhere on my paperwork.  So, I called the surgical center and they told me to be there at 9 am on Thursday.  Is that the time you have me scheduled for?"  The scheduler at the surgical center decides on the time.  So, whatever they told you is the time for your arrival.  We don't give the times because they may have cancellations or have patients that are children or diabetic patients that they like to do first.  "Oh, I see.  Thanks for calling me back."

## 2019-10-21 ENCOUNTER — Other Ambulatory Visit: Payer: Self-pay | Admitting: Podiatry

## 2019-10-21 ENCOUNTER — Encounter: Payer: Self-pay | Admitting: Podiatry

## 2019-10-21 ENCOUNTER — Telehealth: Payer: Self-pay | Admitting: Podiatry

## 2019-10-21 DIAGNOSIS — M7731 Calcaneal spur, right foot: Secondary | ICD-10-CM | POA: Diagnosis not present

## 2019-10-21 DIAGNOSIS — M7661 Achilles tendinitis, right leg: Secondary | ICD-10-CM

## 2019-10-21 MED ORDER — OXYCODONE-ACETAMINOPHEN 5-325 MG PO TABS: 1.0000 | ORAL_TABLET | Freq: Four times a day (QID) | ORAL | 0 refills | Status: DC | PRN

## 2019-10-21 MED ORDER — HYDROCODONE-ACETAMINOPHEN 10-325 MG PO TABS: 1.0000 | ORAL_TABLET | ORAL | 0 refills | Status: DC | PRN

## 2019-10-21 NOTE — Telephone Encounter (Signed)
Left message informing pt I would send his pain medication request to the doctor on call to change due to his  allergy to Percocet.

## 2019-10-21 NOTE — Telephone Encounter (Signed)
Pt had surgery today and forgot to tell the doctor that he is allergic to Percocet.  [facility] [address] [facility] [place]

## 2019-10-21 NOTE — Progress Notes (Signed)
PRN postop 

## 2019-10-21 NOTE — Progress Notes (Unsigned)
Rx sent to pharmacy   

## 2019-10-22 ENCOUNTER — Telehealth: Payer: Self-pay | Admitting: *Deleted

## 2019-10-22 NOTE — Telephone Encounter (Signed)
Pt states the Tylenol #5 is making him itch like crazy. I asked pt to read the medication label and he informed me he was taking oxycodone with acetaminophen. I told pt that was the percocet he took yesterday that made him itch, and asked if he had picked up the hydrocodone with acetaminophen and he said it was not at the pharmacy. I told pt we had confirmation the hydrocodone was [facility] at [facility] at 4:58pm. I told pt he may have similar reactions to the hydrocodone, and if he had difficulty breathing, shortness of breath, facial or throat swelling he should stop and go to ED. Dr. Amalia Hailey may direct him to take with benadryl.

## 2019-10-22 NOTE — Telephone Encounter (Signed)
Pt states he was unable to take the percocet and was ordered a tylenol #5 and is itching like crazy and did not sleep, and needs a different medication.

## 2019-10-25 ENCOUNTER — Ambulatory Visit: Payer: 59 | Attending: Internal Medicine

## 2019-10-25 DIAGNOSIS — Z20822 Contact with and (suspected) exposure to covid-19: Secondary | ICD-10-CM

## 2019-10-26 LAB — NOVEL CORONAVIRUS, NAA: SARS-CoV-2, NAA: NOT DETECTED

## 2019-10-27 ENCOUNTER — Ambulatory Visit (INDEPENDENT_AMBULATORY_CARE_PROVIDER_SITE_OTHER): Payer: 59 | Admitting: Podiatry

## 2019-10-27 ENCOUNTER — Ambulatory Visit (INDEPENDENT_AMBULATORY_CARE_PROVIDER_SITE_OTHER): Payer: 59

## 2019-10-27 ENCOUNTER — Other Ambulatory Visit: Payer: Self-pay

## 2019-10-27 DIAGNOSIS — M7731 Calcaneal spur, right foot: Secondary | ICD-10-CM | POA: Diagnosis not present

## 2019-10-27 DIAGNOSIS — Z9889 Other specified postprocedural states: Secondary | ICD-10-CM

## 2019-10-27 DIAGNOSIS — M7661 Achilles tendinitis, right leg: Secondary | ICD-10-CM

## 2019-10-27 MED ORDER — HYDROCODONE-ACETAMINOPHEN 10-325 MG PO TABS: 1.0000 | ORAL_TABLET | ORAL | 0 refills | Status: DC | PRN

## 2019-10-27 MED ORDER — OXYCODONE-ACETAMINOPHEN 5-325 MG PO TABS: 1.0000 | ORAL_TABLET | Freq: Four times a day (QID) | ORAL | 0 refills | Status: DC | PRN

## 2019-10-28 ENCOUNTER — Telehealth: Payer: Self-pay | Admitting: *Deleted

## 2019-10-28 NOTE — Telephone Encounter (Signed)
I spoke with pt and he states he had to go to [place] on a family issue, and will call with a pharmacy.

## 2019-10-28 NOTE — Telephone Encounter (Signed)
[facility] [address] [facility] the Norco 10/325mg  is out of stock and a new prescription will need to be escribed [facility] [address], pt is going out of town this afternoon.

## 2019-10-31 NOTE — Progress Notes (Signed)
   Subjective:  Patient presents today status post posterior heel spur surgery right. DOS: 10/21/2019. He reports intermittent sharp shooting pain and burning in the heel. He reports associated swelling that is relieved with Motrin. He has been taking Vicodin for pain. There are no worsening factors noted. Patient is here for further evaluation and treatment.    Past Medical History:  Diagnosis Date  . Frequent headaches   . GERD (gastroesophageal reflux disease)   . H/O cardiac arrhythmia   . H/O phlebitis   . Heart murmur   . History of chicken pox   . Hyperlipidemia   . Hypertension       Objective/Physical Exam Neurovascular status intact.  Skin incisions appear to be well coapted with sutures and staples intact. No sign of infectious process noted. No dehiscence. No active bleeding noted. Moderate edema noted to the surgical extremity.  Radiographic Exam:  Orthopedic hardware and osteotomies sites appear to be stable with routine healing.  Assessment: 1. s/p posterior heel spur surgery right. DOS: 10/21/2019   Plan of Care:  1. Patient was evaluated. X-rays reviewed 2. Cast removed.  3. CAM boot dispensed.  4. Continue nonweightbearing with knee scooter.  5. Refill prescription for Percocet 5/325 mg provided to patient.  6. Return to clinic in 2 weeks for possible suture removal.    Edrick Kins, DPM [facility]  Dr. Edrick Kins, [place]                                        [place], [place] [address]                Office [phone]  Fax [phone]

## 2019-11-03 ENCOUNTER — Encounter: Payer: 59 | Admitting: Podiatry

## 2019-11-17 ENCOUNTER — Other Ambulatory Visit: Payer: Self-pay

## 2019-11-17 ENCOUNTER — Ambulatory Visit (INDEPENDENT_AMBULATORY_CARE_PROVIDER_SITE_OTHER): Payer: 59 | Admitting: Podiatry

## 2019-11-17 ENCOUNTER — Encounter: Payer: Self-pay | Admitting: Podiatry

## 2019-11-17 DIAGNOSIS — M7731 Calcaneal spur, right foot: Secondary | ICD-10-CM

## 2019-11-17 DIAGNOSIS — M7661 Achilles tendinitis, right leg: Secondary | ICD-10-CM

## 2019-11-17 DIAGNOSIS — Z9889 Other specified postprocedural states: Secondary | ICD-10-CM

## 2019-11-21 NOTE — Progress Notes (Signed)
   Subjective:  Patient presents today status post posterior heel spur surgery right. DOS: 10/21/2019. He states he is doing well. He reports an intermittent burning sensation along the incision site and dorsal foot. He has been taking Percocet and using the CAM boot as directed. He denies any worsening factors at this time. Patient is here for further evaluation and treatment.    Past Medical History:  Diagnosis Date  . Frequent headaches   . GERD (gastroesophageal reflux disease)   . H/O cardiac arrhythmia   . H/O phlebitis   . Heart murmur   . History of chicken pox   . Hyperlipidemia   . Hypertension       Objective/Physical Exam Neurovascular status intact.  Skin incisions appear to be well coapted with sutures and staples intact. No sign of infectious process noted. No dehiscence. No active bleeding noted. Moderate edema noted to the surgical extremity.  Assessment: 1. s/p posterior heel spur surgery right. DOS: 10/21/2019   Plan of Care:  1. Patient was evaluated.  2. Sutures removed.  3. Continue nonweightbearing for two weeks.  4. Begin weightbearing in CAM boot in two weeks.  5. Return to clinic in 4 weeks to initiate physical therapy.    Edrick Kins, DPM [facility]  Dr. Edrick Kins, [place]                                        [place], [place] [address]                Office [phone]  Fax [phone]

## 2019-12-10 ENCOUNTER — Other Ambulatory Visit: Payer: Self-pay | Admitting: Internal Medicine

## 2019-12-10 DIAGNOSIS — E785 Hyperlipidemia, unspecified: Secondary | ICD-10-CM

## 2019-12-10 DIAGNOSIS — K219 Gastro-esophageal reflux disease without esophagitis: Secondary | ICD-10-CM

## 2019-12-15 ENCOUNTER — Other Ambulatory Visit: Payer: Self-pay

## 2019-12-15 ENCOUNTER — Ambulatory Visit (INDEPENDENT_AMBULATORY_CARE_PROVIDER_SITE_OTHER): Payer: 59 | Admitting: Podiatry

## 2019-12-15 DIAGNOSIS — Z9889 Other specified postprocedural states: Secondary | ICD-10-CM

## 2019-12-15 DIAGNOSIS — M7661 Achilles tendinitis, right leg: Secondary | ICD-10-CM

## 2019-12-19 NOTE — Progress Notes (Signed)
   Subjective:  Patient presents today status post posterior heel spur surgery right. DOS: 10/21/2019. He states he is doing well but reports some achiness of the dorsal foot. He has been using the CAM boot as directed. He has purchased new shoes to begin wearing. There are no modifying factors noted at this time. Patient is here for further evaluation and treatment.    Past Medical History:  Diagnosis Date  . Frequent headaches   . GERD (gastroesophageal reflux disease)   . H/O cardiac arrhythmia   . H/O phlebitis   . Heart murmur   . History of chicken pox   . Hyperlipidemia   . Hypertension       Objective: Physical Exam General: The patient is alert and oriented x3 in no acute distress.  Dermatology: Incisions healed. Skin is cool, dry and supple bilateral lower extremities. Negative for open lesions or macerations.  Vascular: Palpable pedal pulses bilaterally. No edema or erythema noted. Capillary refill within normal limits.  Neurological: Epicritic and protective threshold grossly intact bilaterally.   Musculoskeletal Exam: All pedal and ankle joints range of motion within normal limits bilateral. Muscle strength 5/5 in all groups bilateral.    Assessment: 1. s/p posterior heel spur surgery right. DOS: 10/21/2019   Plan of Care:  1. Patient was evaluated.  2. Physical therapy from [facility] ordered.  3. Discontinue using CAM boot. Recommended good shoe gear.  4. Return to clinic in 6 weeks.     Edrick Kins, DPM [facility]  Dr. Edrick Kins, [place]                                        [place], [place] [address]                Office [phone]  Fax [phone]

## 2019-12-27 ENCOUNTER — Ambulatory Visit: Payer: 59 | Attending: Internal Medicine

## 2019-12-27 DIAGNOSIS — Z23 Encounter for immunization: Secondary | ICD-10-CM

## 2019-12-27 NOTE — Progress Notes (Signed)
   Covid-19 Vaccination Clinic  Name:  Mark [name]    MRN: [id number] DOB: [birth year]  12/27/2019  Mark [name] was observed post Covid-19 immunization for 15 minutes without incident. He was provided with Vaccine Information Sheet and instruction to access the V-Safe system.   Mark [name] was instructed to call 911 with any severe reactions post vaccine: Marland Kitchen Difficulty breathing  . Swelling of face and throat  . A fast heartbeat  . A bad rash all over body  . Dizziness and weakness   Immunizations Administered    Name Date Dose VIS Date Route   Pfizer COVID-19 Vaccine 12/27/2019  2:41 PM 0.3 mL 09/17/2019 Intramuscular   Manufacturer: [place]   Lot: [id number]   [place]: [id number]

## 2020-01-12 ENCOUNTER — Telehealth: Payer: Self-pay | Admitting: Podiatry

## 2020-01-12 NOTE — Telephone Encounter (Signed)
Dr. Amalia Hailey I received paperwork on patient, has he returned to work. I have tried to reach him a couple of times, with no luck.

## 2020-01-17 ENCOUNTER — Ambulatory Visit: Payer: 59

## 2020-01-19 ENCOUNTER — Ambulatory Visit: Payer: 59 | Attending: Internal Medicine

## 2020-01-19 DIAGNOSIS — Z23 Encounter for immunization: Secondary | ICD-10-CM

## 2020-01-19 NOTE — Progress Notes (Signed)
   Covid-19 Vaccination Clinic  Name:  Mark [name]    MRN: [id number] DOB: 11/13/1960  01/19/2020  [name] was observed post Covid-19 immunization for 15 minutes without incident. He was provided with Vaccine Information Sheet and instruction to access the V-Safe system.   [name] was instructed to call 911 with any severe reactions post vaccine: Marland Kitchen Difficulty breathing  . Swelling of face and throat  . A fast heartbeat  . A bad rash all over body  . Dizziness and weakness   Immunizations Administered    Name Date Dose VIS Date Route   Pfizer COVID-19 Vaccine 01/19/2020  8:56 AM 0.3 mL 09/17/2019 Intramuscular   Manufacturer: [place]   Lot: [id number]   [place]: [id number]

## 2020-01-26 ENCOUNTER — Ambulatory Visit (INDEPENDENT_AMBULATORY_CARE_PROVIDER_SITE_OTHER): Payer: 59

## 2020-01-26 ENCOUNTER — Encounter: Payer: Self-pay | Admitting: Podiatry

## 2020-01-26 ENCOUNTER — Ambulatory Visit (INDEPENDENT_AMBULATORY_CARE_PROVIDER_SITE_OTHER): Payer: 59 | Admitting: Podiatry

## 2020-01-26 ENCOUNTER — Other Ambulatory Visit: Payer: Self-pay

## 2020-01-26 DIAGNOSIS — M7661 Achilles tendinitis, right leg: Secondary | ICD-10-CM

## 2020-01-26 DIAGNOSIS — M7731 Calcaneal spur, right foot: Secondary | ICD-10-CM

## 2020-01-31 NOTE — Progress Notes (Signed)
   Subjective:  Patient presents today status post posterior heel spur surgery right. DOS: 10/21/2019. He states he is doing well and is improving significantly. He reports some mild discomfort around the incision site. He reports mild intermittent swelling. He has been doing physical therapy which has helped a lot. He denies any worsening factors. Patient is here for further evaluation and treatment.   Past Medical History:  Diagnosis Date  . Frequent headaches   . GERD (gastroesophageal reflux disease)   . H/O cardiac arrhythmia   . H/O phlebitis   . Heart murmur   . History of chicken pox   . Hyperlipidemia   . Hypertension       Objective: Physical Exam General: The patient is alert and oriented x3 in no acute distress.  Dermatology: Incisions healed. Skin is cool, dry and supple bilateral lower extremities. Negative for open lesions or macerations.  Vascular: Palpable pedal pulses bilaterally. No edema or erythema noted. Capillary refill within normal limits.  Neurological: Epicritic and protective threshold grossly intact bilaterally.   Musculoskeletal Exam: All pedal and ankle joints range of motion within normal limits bilateral. Muscle strength 5/5 in all groups bilateral.    Radiographic Exam:  Osteotomies sites appear to be stable with routine healing.   Assessment: 1. s/p posterior heel spur surgery right. DOS: 10/21/2019   Plan of Care:  1. Patient was evaluated. X-rays reviewed.  2. May resume full activity with no restrictions.  3. Recommended good shoe gear.  4. Return to work on 01/31/2020.  5. Return to clinic as needed.     Edrick Kins, DPM [facility]  Dr. Edrick Kins, [place]                                        [place], [place] [address]                Office [phone]  Fax [phone]

## 2020-02-29 ENCOUNTER — Other Ambulatory Visit: Payer: Self-pay | Admitting: Internal Medicine

## 2020-02-29 DIAGNOSIS — I10 Essential (primary) hypertension: Secondary | ICD-10-CM

## 2020-02-29 DIAGNOSIS — M1000 Idiopathic gout, unspecified site: Secondary | ICD-10-CM

## 2020-02-29 DIAGNOSIS — I471 Supraventricular tachycardia: Secondary | ICD-10-CM

## 2020-02-29 DIAGNOSIS — E785 Hyperlipidemia, unspecified: Secondary | ICD-10-CM

## 2020-02-29 DIAGNOSIS — K219 Gastro-esophageal reflux disease without esophagitis: Secondary | ICD-10-CM

## 2020-03-16 ENCOUNTER — Other Ambulatory Visit: Payer: Self-pay | Admitting: Internal Medicine

## 2020-03-16 DIAGNOSIS — E785 Hyperlipidemia, unspecified: Secondary | ICD-10-CM

## 2020-03-16 DIAGNOSIS — K219 Gastro-esophageal reflux disease without esophagitis: Secondary | ICD-10-CM

## 2020-03-16 DIAGNOSIS — I471 Supraventricular tachycardia: Secondary | ICD-10-CM

## 2020-03-16 DIAGNOSIS — I10 Essential (primary) hypertension: Secondary | ICD-10-CM

## 2020-03-16 DIAGNOSIS — M1000 Idiopathic gout, unspecified site: Secondary | ICD-10-CM

## 2020-05-01 ENCOUNTER — Ambulatory Visit (INDEPENDENT_AMBULATORY_CARE_PROVIDER_SITE_OTHER): Payer: 59 | Admitting: Internal Medicine

## 2020-05-01 ENCOUNTER — Encounter: Payer: Self-pay | Admitting: Internal Medicine

## 2020-05-01 ENCOUNTER — Other Ambulatory Visit: Payer: Self-pay

## 2020-05-01 VITALS — BP 120/78 | HR 86 | Temp 98.4°F | Ht 68.0 in | Wt 211.0 lb

## 2020-05-01 DIAGNOSIS — L502 Urticaria due to cold and heat: Secondary | ICD-10-CM

## 2020-05-01 DIAGNOSIS — R739 Hyperglycemia, unspecified: Secondary | ICD-10-CM

## 2020-05-01 DIAGNOSIS — M1000 Idiopathic gout, unspecified site: Secondary | ICD-10-CM | POA: Diagnosis not present

## 2020-05-01 DIAGNOSIS — I10 Essential (primary) hypertension: Secondary | ICD-10-CM | POA: Diagnosis not present

## 2020-05-01 DIAGNOSIS — E785 Hyperlipidemia, unspecified: Secondary | ICD-10-CM | POA: Diagnosis not present

## 2020-05-01 DIAGNOSIS — Z0000 Encounter for general adult medical examination without abnormal findings: Secondary | ICD-10-CM | POA: Diagnosis not present

## 2020-05-01 DIAGNOSIS — K219 Gastro-esophageal reflux disease without esophagitis: Secondary | ICD-10-CM | POA: Diagnosis not present

## 2020-05-01 DIAGNOSIS — Z1211 Encounter for screening for malignant neoplasm of colon: Secondary | ICD-10-CM

## 2020-05-01 MED ORDER — EPINEPHRINE 0.3 MG/0.3ML IJ SOAJ: 0.3000 mg | INTRAMUSCULAR | 2 refills | Status: DC | PRN

## 2020-05-01 NOTE — Patient Instructions (Signed)

## 2020-05-01 NOTE — Progress Notes (Signed)
Subjective:  Patient ID: Mark [name], male    DOB: [birth year]  Age: 59 y.o. MRN: [id number]  CC: Annual Exam, Gastroesophageal Reflux, Hypertension, and Hyperlipidemia  This visit occurred during the SARS-CoV-2 public health emergency.  Safety protocols were in place, including screening questions prior to the visit, additional usage of staff PPE, and extensive cleaning of exam room while observing appropriate contact time as indicated for disinfecting solutions.    [name] presents for a CPX.  He is very active and denies any recent episodes of chest pain, shortness of breath, palpitations, edema, or fatigue.  He is tolerating the statin well with no muscle aches.  He has chronic joint aches.  His heartburn has been well controlled.  He denies any recent episodes of odynophagia, dysphagia, loss of appetite, or laryngitis.  He has intentionally lost weight with lifestyle modifications.  Outpatient Medications Prior to Visit  Medication Sig Dispense Refill  . allopurinol (ZYLOPRIM) 300 MG tablet TAKE 1 TABLET BY MOUTH  DAILY 90 tablet 1  . Cholecalciferol (VITAMIN D3) 10 MCG (400 UNIT) tablet Take by mouth.    . Diclofenac Sodium (PENNSAID) 2 % SOLN Place 2 g onto the skin 2 (two) times daily. 112 g 3  . gabapentin (NEURONTIN) 100 MG capsule TAKE 2 CAPSULES (200 MG TOTAL) BY MOUTH AT BEDTIME. 60 capsule 3  . Glucosamine-Chondroit-Vit C-Mn (GLUCOSAMINE 1500 COMPLEX PO) Take 1 tablet by mouth daily.     . metoprolol succinate (TOPROL-XL) 50 MG 24 hr tablet TAKE 1 TABLET BY MOUTH  DAILY WITH OR IMMEDIATELY  FOLLOWING A MEAL 90 tablet 1  . pantoprazole (PROTONIX) 40 MG tablet TAKE 1 TABLET BY MOUTH  DAILY 90 tablet 0  . rizatriptan (MAXALT) 10 MG tablet Take 1 tablet (10 mg total) by mouth as needed for migraine. May repeat in 2 hours if needed 10 tablet 5  . rosuvastatin (CRESTOR) 10 MG tablet TAKE 1 TABLET BY MOUTH  DAILY 90 tablet 0  . triamcinolone (NASACORT) 55 MCG/ACT AERO  nasal inhaler Place 2 sprays into the nose daily. 32.4 mL 5  . Vitamin D, Ergocalciferol, (DRISDOL) 50000 units CAPS capsule Take 1 capsule (50,000 Units total) by mouth every 7 (seven) days. 12 capsule 0  . EPINEPHrine 0.3 mg/0.3 mL IJ SOAJ injection Inject 0.3 mg into the muscle as needed (ANAPHYLAXIS).    Marland Kitchen etodolac (LODINE XL) 400 MG 24 hr tablet Take 1 tablet (400 mg total) by mouth daily. 90 tablet 0  . HYDROcodone-acetaminophen (NORCO) 10-325 MG tablet Take 1 tablet by mouth every 4 (four) hours as needed. 20 tablet 0  . traZODone (DESYREL) 50 MG tablet Take 0.5-1 tablets (25-50 mg total) by mouth at bedtime as needed for sleep. 30 tablet 3  . nitroGLYCERIN (NITRODUR - DOSED IN MG/24 HR) 0.2 mg/hr patch 1/4 patch daily 30 patch 1   No facility-administered medications prior to visit.    ROS Review of Systems  Constitutional: Negative.  Negative for appetite change, diaphoresis, fatigue and unexpected weight change.  HENT: Negative.   Eyes: Negative for visual disturbance.  Respiratory: Negative for cough, chest tightness, shortness of breath and wheezing.   Cardiovascular: Negative for chest pain, palpitations and leg swelling.  Gastrointestinal: Negative for abdominal pain, constipation, diarrhea, nausea and vomiting.  Endocrine: Negative.   Genitourinary: Negative.  Negative for difficulty urinating, dysuria, scrotal swelling, testicular pain and urgency.  Musculoskeletal: Positive for arthralgias. Negative for neck pain.  Skin: Negative.   Neurological: Negative.  Negative  for dizziness, weakness, light-headedness, numbness and headaches.  Hematological: Negative for adenopathy. Does not bruise/bleed easily.  Psychiatric/Behavioral: Negative.     Objective:  BP 120/78 (BP Location: Left Arm, Patient Position: Sitting, Cuff Size: Large)   Pulse 86   Temp 98.4 F (36.9 C) (Oral)   Ht 5\' 8"  (1.727 m)   Wt (!) 211 lb (95.7 kg)   SpO2 98%   BMI 32.08 kg/m   BP Readings  from Last 3 Encounters:  05/01/20 120/78  04/22/19 123/81  03/25/19 130/74    Wt Readings from Last 3 Encounters:  05/01/20 (!) 211 lb (95.7 kg)  03/25/19 219 lb (99.3 kg)  02/25/19 220 lb (99.8 kg)    Physical Exam Vitals reviewed.  Constitutional:      Appearance: Normal appearance.  HENT:     Nose: Nose normal.     Mouth/Throat:     Mouth: Mucous membranes are moist.  Eyes:     General: No scleral icterus.    Conjunctiva/sclera: Conjunctivae normal.  Cardiovascular:     Rate and Rhythm: Normal rate and regular rhythm.     Heart sounds: No murmur heard.   Pulmonary:     Effort: Pulmonary effort is normal. No respiratory distress.     Breath sounds: No stridor. No wheezing, rhonchi or rales.  Abdominal:     General: Abdomen is flat. Bowel sounds are normal. There is no distension.     Palpations: Abdomen is soft. There is no hepatomegaly, splenomegaly or mass.     Tenderness: There is no abdominal tenderness.     Hernia: There is no hernia in the left inguinal area or right inguinal area.  Genitourinary:    Pubic Area: No rash.      Penis: Normal and circumcised. No discharge, swelling or lesions.      Testes: Normal.        Right: Mass not present.        Left: Mass not present.     Epididymis:     Right: Normal.     Left: Normal.     Prostate: Normal. Not enlarged, not tender and no nodules present.     Rectum: Normal. Guaiac result negative. No mass, tenderness, anal fissure, external hemorrhoid or internal hemorrhoid. Normal anal tone.  Musculoskeletal:        General: No tenderness. Normal range of motion.     Cervical back: Neck supple.     Right lower leg: No edema.     Left lower leg: No edema.  Lymphadenopathy:     Cervical: No cervical adenopathy.     Lower Body: No right inguinal adenopathy. No left inguinal adenopathy.  Skin:    General: Skin is warm and dry.     Coloration: Skin is not pale.  Neurological:     General: No focal deficit present.       Mental Status: He is alert.  Psychiatric:        Mood and Affect: Mood normal.        Behavior: Behavior normal.     Lab Results  Component Value Date   WBC 7.8 05/09/2020   HGB 14.6 05/09/2020   HCT 43.3 05/09/2020   PLT 242 05/09/2020   GLUCOSE 94 05/09/2020   CHOL 152 05/09/2020   TRIG 108 05/09/2020   HDL 47 05/09/2020   LDLCALC 85 05/09/2020   ALT 18 05/09/2020   AST 27 05/09/2020   NA 139 05/09/2020   K 4.7 05/09/2020  CL 101 05/09/2020   CREATININE 1.12 05/09/2020   BUN 15 05/09/2020   CO2 26 05/09/2020   TSH 1.580 05/09/2020   PSA 0.33 07/02/2016   HGBA1C 5.9 (H) 05/09/2020    DG Epidural/Nerve Root  Result Date: 04/22/2019 CLINICAL DATA:  Lumbosacral spondylosis without myelopathy with radiculopathy. Left-sided low back pain radiating down the outside of the leg to the foot in an L5 distribution. Small synovial cyst at L5 impinging on the exiting left L5 nerve root. The cyst is not amenable to percutaneous aspiration. EXAM: EPIDURAL/NERVE ROOT FLUOROSCOPY TIME:  Radiation Exposure Index (as provided by the fluoroscopic device): 3.2 mGy Fluoroscopy Time:  9 seconds Number of Acquired Images:  0 PROCEDURE: The procedure, risks, benefits, and alternatives were explained to the patient. Questions regarding the procedure were encouraged and answered. The patient understands and consents to the procedure. LEFT L5 NERVE ROOT BLOCK AND TRANSFORAMINAL EPIDURAL: A posterior oblique approach was taken to the intervertebral foramen on the left at L5-S1 using a 5 inch 22 gauge spinal needle. Injection of Isovue M 200 outlined the left L5 nerve root and showed good epidural spread. No vascular opacification is seen. 120 mg of Depo-Medrol mixed with 2 mL of 1% lidocaine were instilled. The procedure was well-tolerated, and the patient was discharged thirty minutes following the injection in good condition. COMPLICATIONS: None immediate. IMPRESSION: Technically successful injection  consisting of a left L5 nerve root block and transforaminal epidural. Electronically Signed   By: Titus Dubin M.D.   On: 04/22/2019 08:12    Assessment & Plan:   Mark [name] was seen today for annual exam, gastroesophageal reflux, hypertension and hyperlipidemia.  Diagnoses and all orders for this visit:  Essential hypertension, benign-his blood pressure is adequately well controlled. -     Cancel: CBC with Differential/Platelet; Future -     Cancel: BASIC METABOLIC PANEL WITH GFR; Future -     Cancel: TSH; Future -     Cancel: Urinalysis, Routine w reflex microscopic; Future -     Urinalysis, Routine w reflex microscopic; Future -     TSH; Future -     BASIC METABOLIC PANEL WITH GFR; Future -     CBC with Differential/Platelet; Future  Gastroesophageal reflux disease without esophagitis-his symptoms are well controlled.  Will continue the PPI. -     Cancel: CBC with Differential/Platelet; Future -     CBC with Differential/Platelet; Future  Hyperlipidemia with target LDL less than 100-he has achieved his LDL goal and is doing well on the statin. -     Cancel: Hepatic function panel; Future -     Hepatic function panel; Future  Idiopathic gout, unspecified chronicity, unspecified site-he has achieved his uric acid goal and has had no recent episodes of gouty arthropathy.  Will continue the current dosage of allopurinol. -     Cancel: Uric acid; Future -     Uric acid; Future  Routine general medical examination at a health care facility-exam completed, labs reviewed, vaccines reviewed and updated, Cologuard ordered to screen for colon cancer/polyps, patient education material was given. -     Cancel: Lipid panel; Future -     Cancel: PSA; Future -     PSA; Future -     Lipid panel; Future  Hyperglycemia-his A1c is down to 5.9%.  Medical therapy is not indicated. -     Cancel: BASIC METABOLIC PANEL WITH GFR; Future -     Cancel: Hemoglobin A1c; Future -  Hemoglobin A1c;  Future -     BASIC METABOLIC PANEL WITH GFR; Future -     Basic Metabolic Panel (BMET)  Urticaria due to cold -     EPINEPHrine 0.3 mg/0.3 mL IJ SOAJ injection; Inject 0.3 mLs (0.3 mg total) into the muscle as needed (ANAPHYLAXIS).  Colon cancer screening -     Cologuard   I have discontinued Mark [name]'s etodolac, traZODone, nitroGLYCERIN, and HYDROcodone-acetaminophen. I have also changed his EPINEPHrine. Additionally, I am having him maintain his Glucosamine-Chondroit-Vit C-Mn (GLUCOSAMINE 1500 COMPLEX PO), rizatriptan, Vitamin D (Ergocalciferol), Diclofenac Sodium, triamcinolone, gabapentin, Vitamin D3, allopurinol, metoprolol succinate, pantoprazole, and rosuvastatin.  Meds ordered this encounter  Medications  . EPINEPHrine 0.3 mg/0.3 mL IJ SOAJ injection    Sig: Inject 0.3 mLs (0.3 mg total) into the muscle as needed (ANAPHYLAXIS).    Dispense:  2 each    Refill:  2   In addition to time spent on CPE, I spent 50 minutes in preparing to see the patient by review of recent labs, imaging and procedures, obtaining and reviewing separately obtained history, communicating with the patient and family or caregiver, ordering medications, tests or procedures, and documenting clinical information in the EHR including the differential Dx, treatment, and any further evaluation and other management of 1. Essential hypertension, benign 2. Gastroesophageal reflux disease without esophagitis 3. Hyperlipidemia with target LDL less than 100 4. Idiopathic gout, unspecified chronicity, unspecified site 5. Hyperglycemia 6. Urticaria due to cold    Follow-up: Return in about 6 months (around 11/01/2020).  Scarlette Calico, MD

## 2020-05-09 ENCOUNTER — Other Ambulatory Visit: Payer: Self-pay | Admitting: Internal Medicine

## 2020-05-10 LAB — HEPATIC FUNCTION PANEL
ALT: 18 IU/L (ref 0–44)
AST: 27 IU/L (ref 0–40)
Albumin: 4.8 g/dL (ref 3.8–4.9)
Alkaline Phosphatase: 76 IU/L (ref 48–121)
Bilirubin Total: 0.7 mg/dL (ref 0.0–1.2)
Bilirubin, Direct: 0.2 mg/dL (ref 0.00–0.40)
Total Protein: 6.8 g/dL (ref 6.0–8.5)

## 2020-05-10 LAB — CBC WITH DIFFERENTIAL/PLATELET
Basophils Absolute: 0 10*3/uL (ref 0.0–0.2)
Basos: 0 %
EOS (ABSOLUTE): 0.1 10*3/uL (ref 0.0–0.4)
Eos: 1 %
Hematocrit: 43.3 % (ref 37.5–51.0)
Hemoglobin: 14.6 g/dL (ref 13.0–17.7)
Immature Grans (Abs): 0 10*3/uL (ref 0.0–0.1)
Immature Granulocytes: 0 %
Lymphocytes Absolute: 2 10*3/uL (ref 0.7–3.1)
Lymphs: 26 %
MCH: 30.4 pg (ref 26.6–33.0)
MCHC: 33.7 g/dL (ref 31.5–35.7)
MCV: 90 fL (ref 79–97)
Monocytes Absolute: 0.6 10*3/uL (ref 0.1–0.9)
Monocytes: 8 %
Neutrophils Absolute: 5.1 10*3/uL (ref 1.4–7.0)
Neutrophils: 65 %
Platelets: 242 10*3/uL (ref 150–450)
RBC: 4.8 x10E6/uL (ref 4.14–5.80)
RDW: 12.4 % (ref 11.6–15.4)
WBC: 7.8 10*3/uL (ref 3.4–10.8)

## 2020-05-10 LAB — LIPID PANEL
Chol/HDL Ratio: 3.2 ratio (ref 0.0–5.0)
Cholesterol, Total: 152 mg/dL (ref 100–199)
HDL: 47 mg/dL (ref >39)
LDL Chol Calc (NIH): 85 mg/dL (ref 0–99)
Triglycerides: 108 mg/dL (ref 0–149)
VLDL Cholesterol Cal: 20 mg/dL (ref 5–40)

## 2020-05-10 LAB — URINALYSIS, ROUTINE W REFLEX MICROSCOPIC
Bilirubin, UA: NEGATIVE
Glucose, UA: NEGATIVE
Ketones, UA: NEGATIVE
Leukocytes,UA: NEGATIVE
Nitrite, UA: NEGATIVE
Protein,UA: NEGATIVE
RBC, UA: NEGATIVE
Specific Gravity, UA: 1.007 (ref 1.005–1.030)
Urobilinogen, Ur: 0.2 mg/dL (ref 0.2–1.0)
pH, UA: 6.5 (ref 5.0–7.5)

## 2020-05-10 LAB — PSA: Prostate Specific Ag, Serum: 0.4 ng/mL (ref 0.0–4.0)

## 2020-05-10 LAB — BASIC METABOLIC PANEL
BUN/Creatinine Ratio: 13 (ref 10–24)
BUN: 15 mg/dL (ref 8–27)
CO2: 26 mmol/L (ref 20–29)
Calcium: 9.7 mg/dL (ref 8.6–10.2)
Chloride: 101 mmol/L (ref 96–106)
Creatinine, Ser: 1.12 mg/dL (ref 0.76–1.27)
GFR calc Af Amer: 82 mL/min/{1.73_m2} (ref >59)
GFR calc non Af Amer: 71 mL/min/{1.73_m2} (ref >59)
Glucose: 94 mg/dL (ref 65–99)
Potassium: 4.7 mmol/L (ref 3.5–5.2)
Sodium: 139 mmol/L (ref 134–144)

## 2020-05-10 LAB — HEMOGLOBIN A1C
Est. average glucose Bld gHb Est-mCnc: 123 mg/dL
Hgb A1c MFr Bld: 5.9 % — ABNORMAL HIGH (ref 4.8–5.6)

## 2020-05-10 LAB — URIC ACID: Uric Acid: 5 mg/dL (ref 3.8–8.4)

## 2020-05-10 LAB — TSH: TSH: 1.58 u[IU]/mL (ref 0.450–4.500)

## 2020-05-16 DIAGNOSIS — M79672 Pain in left foot: Secondary | ICD-10-CM | POA: Insufficient documentation

## 2020-06-01 LAB — COLOGUARD: Cologuard: NEGATIVE

## 2020-06-03 ENCOUNTER — Other Ambulatory Visit: Payer: Self-pay | Admitting: Internal Medicine

## 2020-06-03 DIAGNOSIS — I471 Supraventricular tachycardia: Secondary | ICD-10-CM

## 2020-06-03 DIAGNOSIS — K219 Gastro-esophageal reflux disease without esophagitis: Secondary | ICD-10-CM

## 2020-06-03 DIAGNOSIS — M1000 Idiopathic gout, unspecified site: Secondary | ICD-10-CM

## 2020-06-03 DIAGNOSIS — E785 Hyperlipidemia, unspecified: Secondary | ICD-10-CM

## 2020-06-03 DIAGNOSIS — I10 Essential (primary) hypertension: Secondary | ICD-10-CM

## 2020-06-15 LAB — COLOGUARD: COLOGUARD: NEGATIVE

## 2020-06-15 LAB — FECAL OCCULT BLOOD, GUAIAC: Fecal Occult Blood: NEGATIVE

## 2020-06-15 LAB — EXTERNAL GENERIC LAB PROCEDURE: COLOGUARD: NEGATIVE

## 2020-06-19 ENCOUNTER — Other Ambulatory Visit: Payer: Self-pay

## 2020-06-19 ENCOUNTER — Ambulatory Visit (HOSPITAL_COMMUNITY)
Admission: RE | Admit: 2020-06-19 | Discharge: 2020-06-19 | Disposition: A | Discharge status: Home / Self Care | Payer: 59 | Source: Ambulatory Visit | Attending: Cardiology | Admitting: Cardiology

## 2020-06-19 ENCOUNTER — Other Ambulatory Visit (HOSPITAL_COMMUNITY): Payer: Self-pay | Admitting: Orthopedic Surgery

## 2020-06-19 DIAGNOSIS — M79605 Pain in left leg: Secondary | ICD-10-CM | POA: Diagnosis present

## 2020-06-19 DIAGNOSIS — M79662 Pain in left lower leg: Secondary | ICD-10-CM

## 2020-06-21 ENCOUNTER — Ambulatory Visit (INDEPENDENT_AMBULATORY_CARE_PROVIDER_SITE_OTHER): Payer: 59

## 2020-06-21 ENCOUNTER — Ambulatory Visit: Payer: 59 | Admitting: Podiatry

## 2020-06-21 ENCOUNTER — Other Ambulatory Visit: Payer: Self-pay

## 2020-06-21 DIAGNOSIS — M7661 Achilles tendinitis, right leg: Secondary | ICD-10-CM

## 2020-06-21 DIAGNOSIS — Z9889 Other specified postprocedural states: Secondary | ICD-10-CM

## 2020-06-26 NOTE — Progress Notes (Signed)
   Subjective:  Patient presents today status post posterior heel spur surgery right. DOS: 10/21/2019.  Patient states that he is slowly having some gradual recurrence of pain and discomfort to the surgical foot.  He would like to have further evaluation.  He also has severe pain and tenderness to the left Achilles tendon as well.  Patient denies injury.  He has been very active and working on his feet all day long.  He presents for further treatment and evaluation  Past Medical History:  Diagnosis Date  . Frequent headaches   . GERD (gastroesophageal reflux disease)   . H/O cardiac arrhythmia   . H/O phlebitis   . Heart murmur   . History of chicken pox   . Hyperlipidemia   . Hypertension       Objective: Physical Exam General: The patient is alert and oriented x3 in no acute distress.  Dermatology: Incisions healed. Skin is cool, dry and supple bilateral lower extremities. Negative for open lesions or macerations.  Vascular: Palpable pedal pulses bilaterally. No edema or erythema noted. Capillary refill within normal limits.  Neurological: Epicritic and protective threshold grossly intact bilaterally.   Musculoskeletal Exam: All pedal and ankle joints range of motion within normal limits bilateral. Muscle strength 5/5 in all groups bilateral.  There is some pain on palpation along the Achilles tendon as it inserts into the posterior tubercle of the calcaneus bilateral lower extremities.  There is also a palpable suture associated to the anchor of the Achilles tendon onto the posterior tubercle of the calcaneus.  The suture is palpable to the lateral aspect of the right posterior heel.  It does cause some mild tenderness to palpation and irritation.  There is also some right lateral column pain with weightbearing.  This is very mild and it extends across the entire lateral column of the right foot  Radiographic Exam:  Osteotomies sites appear to be stable with routine  healing.   Assessment: 1. s/p posterior heel spur surgery right. DOS: 10/21/2019 2.  Palpable deep suture of right lateral heel 3.  Right lateral column pain 4.  Achilles tendinitis left    Plan of Care:  1. Patient was evaluated. X-rays reviewed.  2. May continue full activity with no restrictions.  3. Recommended good shoe gear.  4.  Appointment with Pedorthist for custom molded orthotics 5.  Return to clinic as needed.  Patient states that he may want to proceed at 1 point with surgery of the left heel spur/Achilles tendon   Edrick Kins, DPM [facility]  Dr. Edrick Kins, [address]                                        [place], [place] [address]                Office [phone]  Fax [phone]

## 2020-06-28 ENCOUNTER — Encounter: Payer: Self-pay | Admitting: Internal Medicine

## 2020-06-28 NOTE — Progress Notes (Signed)
Outside notes received. Information abstracted. Notes sent to scan.  

## 2020-07-03 ENCOUNTER — Other Ambulatory Visit: Payer: Self-pay

## 2020-07-03 ENCOUNTER — Ambulatory Visit (INDEPENDENT_AMBULATORY_CARE_PROVIDER_SITE_OTHER): Payer: 59 | Admitting: Orthotics

## 2020-07-03 DIAGNOSIS — M7662 Achilles tendinitis, left leg: Secondary | ICD-10-CM

## 2020-07-03 DIAGNOSIS — M7731 Calcaneal spur, right foot: Secondary | ICD-10-CM | POA: Diagnosis not present

## 2020-07-03 DIAGNOSIS — M7661 Achilles tendinitis, right leg: Secondary | ICD-10-CM | POA: Diagnosis not present

## 2020-07-03 DIAGNOSIS — Z9889 Other specified postprocedural states: Secondary | ICD-10-CM

## 2020-07-03 NOTE — Progress Notes (Signed)
Patient came into today to be cast for Custom Foot Orthotics. Upon recommendation of Dr. Amalia Hailey Patient presents with heel pain due to fx, achilles tendonitis Goals are heel cushioning, arch support Plan vendor

## 2020-07-17 ENCOUNTER — Encounter: Payer: Self-pay | Admitting: Internal Medicine

## 2020-07-17 ENCOUNTER — Other Ambulatory Visit: Payer: Self-pay

## 2020-07-17 ENCOUNTER — Ambulatory Visit: Payer: 59 | Admitting: Internal Medicine

## 2020-07-17 VITALS — BP 126/78 | HR 73 | Temp 98.7°F | Resp 16 | Ht 68.0 in | Wt 206.0 lb

## 2020-07-17 DIAGNOSIS — M21962 Unspecified acquired deformity of left lower leg: Secondary | ICD-10-CM | POA: Insufficient documentation

## 2020-07-17 DIAGNOSIS — M7052 Other bursitis of knee, left knee: Secondary | ICD-10-CM | POA: Diagnosis not present

## 2020-07-17 DIAGNOSIS — Z23 Encounter for immunization: Secondary | ICD-10-CM | POA: Diagnosis not present

## 2020-07-17 MED ORDER — DICLOFENAC SODIUM 1 % EX GEL: 4.0000 g | Freq: Four times a day (QID) | CUTANEOUS | 1 refills | Status: AC

## 2020-07-17 NOTE — Progress Notes (Signed)
Subjective:  Patient ID: Mark [name], male    DOB: 10-Dec-1960  Age: 59 y.o. MRN: [id number]  CC: Knee Pain  This visit occurred during the SARS-CoV-2 public health emergency.  Safety protocols were in place, including screening questions prior to the visit, additional usage of staff PPE, and extensive cleaning of exam room while observing appropriate contact time as indicated for disinfecting solutions.    HPI [name] presents for concerns about his L knee.  He describes having pain, redness, and swelling in his left knee that started a little over a month ago.  He tells me he has been seeing an orthopedic surgeon and was told that the area was infected.  He has taken 4 weeks of doxycycline and there has been some improvement but he continues to complain of pain and swelling in the front of his knee around the patella and where the patella inserts onto his lower leg.  He has been using his wife's supply of Voltaren to reduce the pain.  The redness has resolved.  He tells me that a plain x-ray was unremarkable.  He continues to have trouble with his ankles and feet and he tells me that he recently underwent an MRI of one of his ankles but not his knee.  He is followed closely by podiatry and orthopedics.  Outpatient Medications Prior to Visit  Medication Sig Dispense Refill  . allopurinol (ZYLOPRIM) 300 MG tablet TAKE 1 TABLET BY MOUTH  DAILY 90 tablet 3  . Cholecalciferol (VITAMIN D3) 10 MCG (400 UNIT) tablet Take by mouth.    . doxycycline (VIBRAMYCIN) 100 MG capsule Take 100 mg by mouth 2 (two) times daily.    Marland Kitchen EPINEPHrine 0.3 mg/0.3 mL IJ SOAJ injection Inject 0.3 mLs (0.3 mg total) into the muscle as needed (ANAPHYLAXIS). 2 each 2  . gabapentin (NEURONTIN) 100 MG capsule TAKE 2 CAPSULES (200 MG TOTAL) BY MOUTH AT BEDTIME. 60 capsule 3  . Glucosamine-Chondroit-Vit C-Mn (GLUCOSAMINE 1500 COMPLEX PO) Take 1 tablet by mouth daily.     . metoprolol succinate (TOPROL-XL) 50 MG 24 hr  tablet TAKE 1 TABLET BY MOUTH  DAILY WITH OR IMMEDIATELY  FOLLOWING A MEAL 90 tablet 3  . pantoprazole (PROTONIX) 40 MG tablet TAKE 1 TABLET BY MOUTH  DAILY 90 tablet 3  . rizatriptan (MAXALT) 10 MG tablet Take 1 tablet (10 mg total) by mouth as needed for migraine. May repeat in 2 hours if needed 10 tablet 5  . rosuvastatin (CRESTOR) 10 MG tablet TAKE 1 TABLET BY MOUTH  DAILY 90 tablet 3  . triamcinolone (NASACORT) 55 MCG/ACT AERO nasal inhaler Place 2 sprays into the nose daily. 32.4 mL 5  . Vitamin D, Ergocalciferol, (DRISDOL) 50000 units CAPS capsule Take 1 capsule (50,000 Units total) by mouth every 7 (seven) days. 12 capsule 0  . Diclofenac Sodium (PENNSAID) 2 % SOLN Place 2 g onto the skin 2 (two) times daily. 112 g 3   No facility-administered medications prior to visit.    ROS Review of Systems  Constitutional: Negative for chills, fatigue and fever.  HENT: Negative.   Eyes: Negative.   Respiratory: Negative for cough, chest tightness, shortness of breath and wheezing.   Cardiovascular: Negative for chest pain, palpitations and leg swelling.  Gastrointestinal: Negative for abdominal pain, constipation, diarrhea, nausea and vomiting.  Endocrine: Negative.   Genitourinary: Negative.  Negative for difficulty urinating.  Musculoskeletal: Positive for arthralgias.  Skin: Negative.  Negative for color change and rash.  Neurological:  Negative.  Negative for dizziness, weakness and light-headedness.  Hematological: Negative for adenopathy. Does not bruise/bleed easily.  Psychiatric/Behavioral: Negative.     Objective:  BP 126/78   Pulse 73   Temp 98.7 F (37.1 C) (Oral)   Resp 16   Ht 5\' 8"  (1.727 m)   Wt 206 lb (93.4 kg)   SpO2 97%   BMI 31.32 kg/m   BP Readings from Last 3 Encounters:  07/17/20 126/78  05/01/20 120/78  04/22/19 123/81    Wt Readings from Last 3 Encounters:  07/17/20 206 lb (93.4 kg)  05/01/20 (!) 211 lb (95.7 kg)  03/25/19 219 lb (99.3 kg)     Physical Exam Vitals reviewed.  HENT:     Nose: Nose normal.     Mouth/Throat:     Mouth: Mucous membranes are moist.  Eyes:     General: No scleral icterus.    Conjunctiva/sclera: Conjunctivae normal.  Cardiovascular:     Rate and Rhythm: Normal rate and regular rhythm.     Heart sounds: No murmur heard.   Pulmonary:     Effort: Pulmonary effort is normal.     Breath sounds: No stridor. No wheezing, rhonchi or rales.  Abdominal:     General: Abdomen is flat. Bowel sounds are normal. There is no distension.     Palpations: Abdomen is soft. There is no hepatomegaly.  Musculoskeletal:     Cervical back: Neck supple.     Right knee: Normal.     Left knee: Swelling, deformity and effusion present. No erythema or bony tenderness. Normal range of motion. Tenderness present over the patellar tendon.     Comments: The patella tender is swollen and tender.  There is an effusion at the insertion onto the tibia.  Lymphadenopathy:     Cervical: No cervical adenopathy.  Neurological:     Mental Status: He is alert.     Lab Results  Component Value Date   WBC 7.8 05/09/2020   HGB 14.6 05/09/2020   HCT 43.3 05/09/2020   PLT 242 05/09/2020   GLUCOSE 94 05/09/2020   CHOL 152 05/09/2020   TRIG 108 05/09/2020   HDL 47 05/09/2020   LDLCALC 85 05/09/2020   ALT 18 05/09/2020   AST 27 05/09/2020   NA 139 05/09/2020   K 4.7 05/09/2020   CL 101 05/09/2020   CREATININE 1.12 05/09/2020   BUN 15 05/09/2020   CO2 26 05/09/2020   TSH 1.580 05/09/2020   PSA 0.33 07/02/2016   HGBA1C 5.9 (H) 05/09/2020    VAS [place] LOWER EXTREMITY VENOUS (DVT)  Result Date: 06/23/2020  Lower Venous DVTStudy Other Indications: Patient presents with cellulitis of the left knee/leg, has                    been on antibiotics for a few days and he says it is feeling                    much better, but he is still having some swelling and edema                    of the left foot and calf; R/O DVT. Risk  Factors: None identified. Performing Technologist: Mariane Masters RVT  Examination Guidelines: A complete evaluation includes B-mode imaging, spectral Doppler, color Doppler, and power Doppler as needed of all accessible portions of each vessel. Bilateral testing is considered an integral part of a complete examination. Limited examinations  for reoccurring indications may be performed as noted. The reflux portion of the exam is performed with the patient in reverse Trendelenburg.  +-----+---------------+---------+-----------+----------+--------------+ RIGHTCompressibilityPhasicitySpontaneityPropertiesThrombus Aging +-----+---------------+---------+-----------+----------+--------------+ CFV  Full           Yes      Yes                                 +-----+---------------+---------+-----------+----------+--------------+   +---------+---------------+---------+-----------+----------+--------------+ LEFT     CompressibilityPhasicitySpontaneityPropertiesThrombus Aging +---------+---------------+---------+-----------+----------+--------------+ CFV      Full           Yes      Yes                                 +---------+---------------+---------+-----------+----------+--------------+ SFJ      Full           Yes      Yes                                 +---------+---------------+---------+-----------+----------+--------------+ FV Prox  Full           Yes      Yes                                 +---------+---------------+---------+-----------+----------+--------------+ FV Mid   Full           Yes      Yes                                 +---------+---------------+---------+-----------+----------+--------------+ FV DistalFull           Yes      Yes                                 +---------+---------------+---------+-----------+----------+--------------+ PFV      Full                                                         +---------+---------------+---------+-----------+----------+--------------+ POP      Full           Yes      Yes                                 +---------+---------------+---------+-----------+----------+--------------+ PTV      Full           Yes      Yes                                 +---------+---------------+---------+-----------+----------+--------------+ PERO     Full           Yes      Yes                                 +---------+---------------+---------+-----------+----------+--------------+  Gastroc  Full                                                        +---------+---------------+---------+-----------+----------+--------------+ GSV      Full           Yes      Yes                                 +---------+---------------+---------+-----------+----------+--------------+     Summary: RIGHT: - No evidence of common femoral vein obstruction.  LEFT: - No evidence of deep vein thrombosis in the lower extremity. No indirect evidence of obstruction proximal to the inguinal ligament. - No cystic structure found in the popliteal fossa.  *See table(s) above for measurements and observations. Electronically signed by Kathlyn Sacramento MD on 06/23/2020 at 9:02:12 AM.    Final     Assessment & Plan:   Kennie was seen today for knee pain.  Diagnoses and all orders for this visit:  Acquired deformity of left knee -     MR Knee Left  Wo Contrast; Future  Patellar bursitis of left knee- Will continue topical diclofenac to reduce the pain and swelling.  I recommended that he undergo an MRI to see if there is an abscess, bursitis, disruption of, or effusion involving the patellar tendon. -     diclofenac Sodium (VOLTAREN) 1 % GEL; Apply 4 g topically 4 (four) times daily. -     MR Knee Left  Wo Contrast; Future  Other orders -     Flu Vaccine QUAD 6+ mos PF IM (Fluarix Quad PF)   I have discontinued Fue Copen's Diclofenac Sodium. I am also having him start on  diclofenac Sodium. Additionally, I am having him maintain his Glucosamine-Chondroit-Vit C-Mn (GLUCOSAMINE 1500 COMPLEX PO), rizatriptan, Vitamin D (Ergocalciferol), triamcinolone, gabapentin, Vitamin D3, EPINEPHrine, metoprolol succinate, pantoprazole, rosuvastatin, allopurinol, and doxycycline.  Meds ordered this encounter  Medications  . diclofenac Sodium (VOLTAREN) 1 % GEL    Sig: Apply 4 g topically 4 (four) times daily.    Dispense:  350 g    Refill:  1     Follow-up: No follow-ups on file.  Scarlette Calico, MD

## 2020-07-18 ENCOUNTER — Encounter: Payer: Self-pay | Admitting: Internal Medicine

## 2020-07-19 ENCOUNTER — Encounter: Payer: Self-pay | Admitting: Internal Medicine

## 2020-07-20 ENCOUNTER — Ambulatory Visit
Admission: RE | Admit: 2020-07-20 | Discharge: 2020-07-20 | Disposition: A | Discharge status: Home / Self Care | Payer: 59 | Source: Ambulatory Visit | Attending: Internal Medicine | Admitting: Internal Medicine

## 2020-07-20 ENCOUNTER — Ambulatory Visit: Payer: Self-pay

## 2020-07-20 ENCOUNTER — Encounter: Payer: Self-pay | Admitting: Family Medicine

## 2020-07-20 ENCOUNTER — Ambulatory Visit: Payer: 59 | Admitting: Family Medicine

## 2020-07-20 ENCOUNTER — Other Ambulatory Visit: Payer: Self-pay

## 2020-07-20 VITALS — BP 112/70 | HR 70 | Ht 68.0 in | Wt 216.0 lb

## 2020-07-20 DIAGNOSIS — M79671 Pain in right foot: Secondary | ICD-10-CM

## 2020-07-20 DIAGNOSIS — S86011A Strain of right Achilles tendon, initial encounter: Secondary | ICD-10-CM | POA: Diagnosis not present

## 2020-07-20 DIAGNOSIS — M21962 Unspecified acquired deformity of left lower leg: Secondary | ICD-10-CM

## 2020-07-20 DIAGNOSIS — M7052 Other bursitis of knee, left knee: Secondary | ICD-10-CM

## 2020-07-20 NOTE — Patient Instructions (Signed)
Heels lifts MRI right ankle [phone]

## 2020-07-20 NOTE — Progress Notes (Signed)
[facility] [place] Phone: [phone] Subjective:   Fontaine No, am serving as a scribe for Dr. Hulan Saas. This visit occurred during the SARS-CoV-2 public health emergency.  Safety protocols were in place, including screening questions prior to the visit, additional usage of staff PPE, and extensive cleaning of exam room while observing appropriate contact time as indicated for disinfecting solutions.   I'm seeing this patient by the request  of:  Janith Lima, MD  CC: Right ankle pain  YWV:[id number]  Mark [name] is a 59 y.o. male coming in with complaint of right foot pain. Last seen 03/25/2019 for lumbar radiculopathy. Patient states that he had surgery in January 2021. Pain has not subsided in his achilles and over calcaneous. Patient has tried PT as well. Patient was told he would benefit from "stem cell" injection. Pain is constant. Wears custom made orthotics. Saw Dr. Amalia Hailey 3 weeks ago but would like another opinion.  Patient feels like he has not made any significant improvement at this time now having pain on the contralateral side because he feels like he is compensating.  Has been seen for more of a Workmen's Compensation on the left foot and still being followed     Past Medical History:  Diagnosis Date  . Frequent headaches   . GERD (gastroesophageal reflux disease)   . H/O cardiac arrhythmia   . H/O phlebitis   . Heart murmur   . History of chicken pox   . Hyperlipidemia   . Hypertension    Past Surgical History:  Procedure Laterality Date  . HERNIA REPAIR    . Left elbow radial collateral ligament repair  09/2012   Social History   Socioeconomic History  . Marital status: Married    Spouse name: Not on file  . Number of children: Not on file  . Years of education: Not on file  . Highest education level: Not on file  Occupational History  . Not on file  Tobacco Use  . Smoking status:  Never Smoker  . Smokeless tobacco: Never Used  Substance and Sexual Activity  . Alcohol use: No  . Drug use: No  . Sexual activity: Yes  Other Topics Concern  . Not on file  Social History Narrative  . Not on file   Social Determinants of Health   Financial Resource Strain:   . Difficulty of Paying Living Expenses: Not on file  Food Insecurity:   . Worried About Charity fundraiser in the Last Year: Not on file  . Ran Out of Food in the Last Year: Not on file  Transportation Needs:   . Lack of Transportation (Medical): Not on file  . Lack of Transportation (Non-Medical): Not on file  Physical Activity:   . Days of Exercise per Week: Not on file  . Minutes of Exercise per Session: Not on file  Stress:   . Feeling of Stress : Not on file  Social Connections:   . Frequency of Communication with Friends and Family: Not on file  . Frequency of Social Gatherings with Friends and Family: Not on file  . Attends Religious Services: Not on file  . Active Member of Clubs or Organizations: Not on file  . Attends Archivist Meetings: Not on file  . Marital Status: Not on file   Allergies  Allergen Reactions  . Cold Buster [Zinc] Hives  . Percocet [Oxycodone-Acetaminophen]    Family History  Problem Relation Age of Onset  . Lung cancer Mother   . Arthritis Mother   . Hyperlipidemia Mother   . Hypertension Mother   . Alcohol abuse Father   . Heart disease Father   . Prostate cancer Father   . Hyperlipidemia Father   . Stroke Father   . Hypertension Father   . Heart attack Father   . Alcohol abuse Sister   . Drug abuse Sister   . Alcohol abuse Brother   . Heart attack Brother   . Anemia Paternal Grandfather      Current Outpatient Medications (Cardiovascular):  Marland Kitchen  EPINEPHrine 0.3 mg/0.3 mL IJ SOAJ injection, Inject 0.3 mLs (0.3 mg total) into the muscle as needed (ANAPHYLAXIS). Marland Kitchen  metoprolol succinate (TOPROL-XL) 50 MG 24 hr tablet, TAKE 1 TABLET BY MOUTH   DAILY WITH OR IMMEDIATELY  FOLLOWING A MEAL .  rosuvastatin (CRESTOR) 10 MG tablet, TAKE 1 TABLET BY MOUTH  DAILY  Current Outpatient Medications (Respiratory):  .  triamcinolone (NASACORT) 55 MCG/ACT AERO nasal inhaler, Place 2 sprays into the nose daily.  Current Outpatient Medications (Analgesics):  .  allopurinol (ZYLOPRIM) 300 MG tablet, TAKE 1 TABLET BY MOUTH  DAILY .  rizatriptan (MAXALT) 10 MG tablet, Take 1 tablet (10 mg total) by mouth as needed for migraine. May repeat in 2 hours if needed   Current Outpatient Medications (Other):  Marland Kitchen  Cholecalciferol (VITAMIN D3) 10 MCG (400 UNIT) tablet, Take by mouth. .  diclofenac Sodium (VOLTAREN) 1 % GEL, Apply 4 g topically 4 (four) times daily. Marland Kitchen  doxycycline (VIBRAMYCIN) 100 MG capsule, Take 100 mg by mouth 2 (two) times daily. Marland Kitchen  gabapentin (NEURONTIN) 100 MG capsule, TAKE 2 CAPSULES (200 MG TOTAL) BY MOUTH AT BEDTIME. Marland Kitchen  Glucosamine-Chondroit-Vit C-Mn (GLUCOSAMINE 1500 COMPLEX PO), Take 1 tablet by mouth daily.  .  pantoprazole (PROTONIX) 40 MG tablet, TAKE 1 TABLET BY MOUTH  DAILY .  Vitamin D, Ergocalciferol, (DRISDOL) 50000 units CAPS capsule, Take 1 capsule (50,000 Units total) by mouth every 7 (seven) days.   Reviewed prior external information including notes and imaging from  primary care provider As well as notes that were available from care everywhere and other healthcare systems.  Past medical history, social, surgical and family history all reviewed in electronic medical record.  No pertanent information unless stated regarding to the chief complaint.   Review of Systems:  No headache, visual changes, nausea, vomiting, diarrhea, constipation, dizziness, abdominal pain, skin rash, fevers, chills, night sweats, weight loss, swollen lymph nodes, body aches, joint swelling, chest pain, shortness of breath, mood changes. POSITIVE muscle aches  Objective  Blood pressure 112/70, pulse 70, height 5\' 8"  (1.727 m), weight 216 lb  (98 kg), SpO2 99 %.   General: No apparent distress alert and oriented x3 mood and affect normal, dressed appropriately.  HEENT: Pupils equal, extraocular movements intact  Respiratory: Patient's speak in full sentences and does not appear short of breath  Cardiovascular: No lower extremity edema, non tender, no erythema  Severely antalgic Right ankle shows that patient does have an incision that is horizontal across the Achilles near the calcaneal area.  Patient does have some atrophy of his leg compared to the contralateral side.  Patient does have some mild limited range of motion but does seem to do okay.  Neurovascularly intact distally.  4-5 strength of the ankle noted especially with dorsi flexion.  Limited musculoskeletal ultrasound was performed and interpreted by Lyndal Pulley  Limited ultrasound  of patient's ankle shows significant hypoechoic changes with what appears to be an intrasubstance tearing noted of the Achilles.  Increasing Doppler flow in increase hypoechoic changes.  Concerning for some mild retraction as well of the Achilles.  Patient's calcaneal area also has significant amount of calcific changes of the soft tissue. Impression: Questionable partial tear Achilles.     Impression and Recommendations:     The above documentation has been reviewed and is accurate and complete Lyndal Pulley, DO

## 2020-07-20 NOTE — Assessment & Plan Note (Signed)
Patient did have surgery in January of this year. Unfortunately the patient continues to have pain.  On ultrasound very difficult to assess if patient does have the Achilles intact.  Calcific changes noted again.  Patient does have mild cortical irregularity also of the calcaneal area.  I think at this point secondary to patient not making significant improvement over the last initial months that an MRI would be beneficial.  This could change medical management.  Patient is interested in the possibility of PRP which we can consider but the other ideas potentially may need a revision.  Patient will follow up with me after the imaging and we will discuss further.

## 2020-07-21 ENCOUNTER — Encounter: Payer: Self-pay | Admitting: Family Medicine

## 2020-08-08 ENCOUNTER — Ambulatory Visit
Admission: RE | Admit: 2020-08-08 | Discharge: 2020-08-08 | Disposition: A | Discharge status: Home / Self Care | Payer: 59 | Source: Ambulatory Visit | Attending: Family Medicine | Admitting: Family Medicine

## 2020-08-08 DIAGNOSIS — M79671 Pain in right foot: Secondary | ICD-10-CM

## 2020-08-09 ENCOUNTER — Encounter: Payer: Self-pay | Admitting: Family Medicine

## 2020-08-09 ENCOUNTER — Other Ambulatory Visit: Payer: Self-pay

## 2020-08-09 DIAGNOSIS — S86011A Strain of right Achilles tendon, initial encounter: Secondary | ICD-10-CM

## 2020-08-14 ENCOUNTER — Other Ambulatory Visit: Payer: Self-pay

## 2020-08-14 ENCOUNTER — Ambulatory Visit: Payer: 59 | Admitting: Orthotics

## 2020-08-14 DIAGNOSIS — M7731 Calcaneal spur, right foot: Secondary | ICD-10-CM

## 2020-08-14 DIAGNOSIS — Z9889 Other specified postprocedural states: Secondary | ICD-10-CM

## 2020-08-14 DIAGNOSIS — M7661 Achilles tendinitis, right leg: Secondary | ICD-10-CM

## 2020-08-14 NOTE — Progress Notes (Signed)
Patient came in today to pick up custom made foot orthotics.  The goals were accomplished and the patient reported no dissatisfaction with said orthotics.  Patient was advised of breakin period and how to report any issues. 

## 2021-04-05 ENCOUNTER — Other Ambulatory Visit: Payer: Self-pay | Admitting: Internal Medicine

## 2021-04-05 DIAGNOSIS — E785 Hyperlipidemia, unspecified: Secondary | ICD-10-CM

## 2021-04-05 DIAGNOSIS — M1000 Idiopathic gout, unspecified site: Secondary | ICD-10-CM

## 2021-04-05 DIAGNOSIS — I471 Supraventricular tachycardia: Secondary | ICD-10-CM

## 2021-04-05 DIAGNOSIS — I10 Essential (primary) hypertension: Secondary | ICD-10-CM

## 2021-04-05 DIAGNOSIS — K219 Gastro-esophageal reflux disease without esophagitis: Secondary | ICD-10-CM

## 2021-04-25 ENCOUNTER — Encounter: Payer: Self-pay | Admitting: Internal Medicine

## 2021-04-25 ENCOUNTER — Other Ambulatory Visit: Payer: Self-pay | Admitting: Internal Medicine

## 2021-04-25 DIAGNOSIS — U071 COVID-19: Secondary | ICD-10-CM | POA: Insufficient documentation

## 2021-04-25 DIAGNOSIS — J208 Acute bronchitis due to other specified organisms: Secondary | ICD-10-CM | POA: Insufficient documentation

## 2021-04-25 MED ORDER — PAXLOVID 20 X 150 MG & 10 X 100MG PO TBPK: 3.0000 | ORAL_TABLET | Freq: Two times a day (BID) | ORAL | 0 refills | Status: AC

## 2021-04-25 NOTE — Progress Notes (Signed)
[place] [facility] Phone: [phone] Subjective:   Mark [name], am serving as a scribe for Dr. Hulan [name]. This visit occurred during the SARS-CoV-2 public health emergency.  Safety protocols were in place, including screening questions prior to the visit, additional usage of staff PPE, and extensive cleaning of exam room while observing appropriate contact time as indicated for disinfecting solutions.   I'm seeing this patient by the request  of:  Mark Lima, MD  CC: left elbow pain   [id number]  Mark [name] is a 60 y.o. male coming in with complaint of B elbow pain. Last seen in 2021 for achilles pain. Patient states that he does about 200-300 push ups at night. Pain in R elbow over medial epicondyle. Unable to straightened elbow for 5 years. Pain in L elbow for one month. Patient had surgery on L elbow years ago and relocated ulnar nerve. Pain in L elbow is over olecranon and into distal tricep. Patient has tingling in 4th and 5th fingers. Has tried IBU for pain relief.       Past Medical History:  Diagnosis Date   Frequent headaches    GERD (gastroesophageal reflux disease)    H/O cardiac arrhythmia    H/O phlebitis    Heart murmur    History of chicken pox    Hyperlipidemia    Hypertension    Past Surgical History:  Procedure Laterality Date   HERNIA REPAIR     Left elbow radial collateral ligament repair  09/2012   Social History   Socioeconomic History   Marital status: Married    Spouse name: Not on file   Number of children: Not on file   Years of education: Not on file   Highest education level: Not on file  Occupational History   Not on file  Tobacco Use   Smoking status: Never   Smokeless tobacco: Never  Substance and Sexual Activity   Alcohol use: [name]   Drug use: [name]   Sexual activity: Yes  Other Topics Concern   Not on file  Social History Narrative   Not on file    Social Determinants of Health   Financial Resource Strain: Not on file  Food Insecurity: Not on file  Transportation Needs: Not on file  Physical Activity: Not on file  Stress: Not on file  Social Connections: Not on file   Allergies  Allergen Reactions   Cold Buster [Zinc] Hives   Percocet [Oxycodone-Acetaminophen]    Family History  Problem Relation Age of Onset   Lung cancer Mother    Arthritis Mother    Hyperlipidemia Mother    Hypertension Mother    Alcohol abuse Father    Heart disease Father    Prostate cancer Father    Hyperlipidemia Father    Stroke Father    Hypertension Father    Heart attack Father    Alcohol abuse Sister    Drug abuse Sister    Alcohol abuse Brother    Heart attack Brother    Anemia Paternal Grandfather      Current Outpatient Medications (Cardiovascular):    EPINEPHrine 0.3 mg/0.3 mL IJ SOAJ injection, Inject 0.3 mLs (0.3 mg total) into the muscle as needed (ANAPHYLAXIS).   metoprolol succinate (TOPROL-XL) 50 MG 24 hr tablet, TAKE 1 TABLET BY MOUTH  DAILY WITH OR IMMEDIATELY  FOLLOWING A MEAL   rosuvastatin (CRESTOR) 10 MG tablet, TAKE 1 TABLET BY MOUTH  DAILY  Current Outpatient Medications (Respiratory):    triamcinolone (NASACORT) 55 MCG/ACT AERO nasal inhaler, Place 2 sprays into the nose daily.  Current Outpatient Medications (Analgesics):    allopurinol (ZYLOPRIM) 300 MG tablet, TAKE 1 TABLET BY MOUTH  DAILY   rizatriptan (MAXALT) 10 MG tablet, Take 1 tablet (10 mg total) by mouth as needed for migraine. May repeat in 2 hours if needed   Current Outpatient Medications (Other):    Cholecalciferol (VITAMIN D3) 10 MCG (400 UNIT) tablet, Take by mouth.   diclofenac Sodium (VOLTAREN) 1 % GEL, Apply 4 g topically 4 (four) times daily.   Glucosamine-Chondroit-Vit C-Mn (GLUCOSAMINE 1500 COMPLEX PO), Take 1 tablet by mouth daily.    pantoprazole (PROTONIX) 40 MG tablet, TAKE 1 TABLET BY MOUTH  DAILY   Vitamin D, Ergocalciferol,  (DRISDOL) 50000 units CAPS capsule, Take 1 capsule (50,000 Units total) by mouth every 7 (seven) days.   gabapentin (NEURONTIN) 100 MG capsule, TAKE 2 CAPSULES (200 MG TOTAL) BY MOUTH AT BEDTIME.   Reviewed prior external information including notes and imaging from  primary care provider As well as notes that were available from care everywhere and other healthcare systems.  Past medical history, social, surgical and family history all reviewed in electronic medical record.  [name] pertanent information unless stated regarding to the chief complaint.   Review of Systems:  [name] headache, visual changes, nausea, vomiting, diarrhea, constipation, dizziness, abdominal pain, skin rash, fevers, chills, night sweats, weight loss, swollen lymph nodes,  joint swelling, chest pain, shortness of breath, mood changes. POSITIVE muscle aches, body aches  Objective  Blood pressure 120/80, pulse 74, height 5\' 8"  (1.727 m), weight 207 lb (93.9 kg), SpO2 98 %.   General: [name] apparent distress alert and oriented x3 mood and affect normal, dressed appropriately.  HEENT: Pupils equal, extraocular movements intact  Respiratory: Patient's speak in full sentences and does not appear short of breath  Cardiovascular: [name] lower extremity edema, non tender, [name] erythema  Gait antalgic MSK: Left elbow exam shows the patient has tenderness to palpation over the distal aspect of the tricep.  Mild swelling noted over the olecranon area.  [name] pain with resisted extension of the arm. Right elbow exam does have significant decrease in range of motion in all planes. Patient has decreased to 20 degrees of flexion as well as the last 10 degrees of extension.  Near full supination and pronation.  Decent grip strength.  Limited muscular skeletal ultrasound was performed and interpreted by Mark [name], M  Limited musculoskeletal ultrasound of patient's left elbow area shows the patient does have significant hypoechoic changes and  increasing Doppler flow and neovascularization of the distal triceps that is consistent with potential intrasubstance tearing noted. Impression: Partial triceps tendon tear   Impression and Recommendations:     The above documentation has been reviewed and is accurate and complete Mark Pulley, DO

## 2021-04-30 ENCOUNTER — Ambulatory Visit: Payer: 59 | Admitting: Family Medicine

## 2021-04-30 ENCOUNTER — Other Ambulatory Visit: Payer: Self-pay

## 2021-04-30 ENCOUNTER — Encounter: Payer: Self-pay | Admitting: Family Medicine

## 2021-04-30 ENCOUNTER — Ambulatory Visit (INDEPENDENT_AMBULATORY_CARE_PROVIDER_SITE_OTHER): Payer: 59

## 2021-04-30 ENCOUNTER — Ambulatory Visit: Payer: Self-pay

## 2021-04-30 VITALS — BP 120/80 | HR 74 | Ht 68.0 in | Wt 207.0 lb

## 2021-04-30 DIAGNOSIS — M25522 Pain in left elbow: Secondary | ICD-10-CM

## 2021-04-30 DIAGNOSIS — M25521 Pain in right elbow: Secondary | ICD-10-CM

## 2021-04-30 DIAGNOSIS — M19021 Primary osteoarthritis, right elbow: Secondary | ICD-10-CM

## 2021-04-30 DIAGNOSIS — S46312A Strain of muscle, fascia and tendon of triceps, left arm, initial encounter: Secondary | ICD-10-CM

## 2021-04-30 NOTE — Assessment & Plan Note (Signed)
Partial tearing noted.  Patient does have what appears to be more of a bony abnormality as well over the olecranon.  Questionable stress reaction.  Discussed posture and ergonomics, discussed with compression.  Patient will avoid certain activities.  Follow-up with me again 6 to 8 weeks.

## 2021-04-30 NOTE — Assessment & Plan Note (Signed)
I believe the patient will have significant arthritis of the right elbow.  I think it is more osteoarthritis.  Potentially traumatic.  Patient also has done steroid injections previously.  Follow-up with me again in 4 to 8 weeks.

## 2021-04-30 NOTE — Patient Instructions (Addendum)
Try decreasing push ups at night Compression sleeve Ice after activity 20 min Pennsaid 2x a day Voltaren once pennsaid is done-find at pharmacy Xray today See me again in 6 weeks

## 2021-05-01 ENCOUNTER — Encounter: Payer: Self-pay | Admitting: Family Medicine

## 2021-05-28 ENCOUNTER — Ambulatory Visit (INDEPENDENT_AMBULATORY_CARE_PROVIDER_SITE_OTHER): Payer: 59 | Admitting: Internal Medicine

## 2021-05-28 ENCOUNTER — Other Ambulatory Visit: Payer: Self-pay

## 2021-05-28 ENCOUNTER — Encounter: Payer: Self-pay | Admitting: Internal Medicine

## 2021-05-28 VITALS — BP 122/70 | HR 68 | Temp 98.6°F | Ht 68.0 in | Wt 210.0 lb

## 2021-05-28 DIAGNOSIS — Z23 Encounter for immunization: Secondary | ICD-10-CM | POA: Insufficient documentation

## 2021-05-28 DIAGNOSIS — R1319 Other dysphagia: Secondary | ICD-10-CM | POA: Diagnosis not present

## 2021-05-28 DIAGNOSIS — M1000 Idiopathic gout, unspecified site: Secondary | ICD-10-CM | POA: Diagnosis not present

## 2021-05-28 DIAGNOSIS — I10 Essential (primary) hypertension: Secondary | ICD-10-CM | POA: Diagnosis not present

## 2021-05-28 DIAGNOSIS — Z0000 Encounter for general adult medical examination without abnormal findings: Secondary | ICD-10-CM

## 2021-05-28 NOTE — Patient Instructions (Signed)
Health Maintenance, Male Adopting a healthy lifestyle and getting preventive care are important in promoting health and wellness. Ask your health care provider about: The right schedule for you to have regular tests and exams. Things you can do on your own to prevent diseases and keep yourself healthy. What should I know about diet, weight, and exercise? Eat a healthy diet  Eat a diet that includes plenty of vegetables, fruits, low-fat dairy products, and lean protein. Do not eat a lot of foods that are high in solid fats, added sugars, or sodium.  Maintain a healthy weight Body mass index (BMI) is a measurement that can be used to identify possible weight problems. It estimates body fat based on height and weight. Your health care provider can help determine your BMI and help you achieve or maintain ahealthy weight. Get regular exercise Get regular exercise. This is one of the most important things you can do for your health. Most adults should: Exercise for at least 150 minutes each week. The exercise should increase your heart rate and make you sweat (moderate-intensity exercise). Do strengthening exercises at least twice a week. This is in addition to the moderate-intensity exercise. Spend less time sitting. Even light physical activity can be beneficial. Watch cholesterol and blood lipids Have your blood tested for lipids and cholesterol at 61 years of age, then havethis test every 5 years. You may need to have your cholesterol levels checked more often if: Your lipid or cholesterol levels are high. You are older than 61 years of age. You are at high risk for heart disease. What should I know about cancer screening? Many types of cancers can be detected early and may often be prevented. Depending on your health history and family history, you may need to have cancer screening at various ages. This may include screening for: Colorectal cancer. Prostate cancer. Skin cancer. Lung  cancer. What should I know about heart disease, diabetes, and high blood pressure? Blood pressure and heart disease High blood pressure causes heart disease and increases the risk of stroke. This is more likely to develop in people who have high blood pressure readings, are of African descent, or are overweight. Talk with your health care provider about your target blood pressure readings. Have your blood pressure checked: Every 3-5 years if you are 18-39 years of age. Every year if you are 40 years old or older. If you are between the ages of 65 and 75 and are a current or former smoker, ask your health care provider if you should have a one-time screening for abdominal aortic aneurysm (AAA). Diabetes Have regular diabetes screenings. This checks your fasting blood sugar level. Have the screening done: Once every three years after age 45 if you are at a normal weight and have a low risk for diabetes. More often and at a younger age if you are overweight or have a high risk for diabetes. What should I know about preventing infection? Hepatitis B If you have a higher risk for hepatitis B, you should be screened for this virus. Talk with your health care provider to find out if you are at risk forhepatitis B infection. Hepatitis C Blood testing is recommended for: Everyone born from 1945 through 1965. Anyone with known risk factors for hepatitis C. Sexually transmitted infections (STIs) You should be screened each year for STIs, including gonorrhea and chlamydia, if: You are sexually active and are younger than 61 years of age. You are older than 61 years of age   and your health care provider tells you that you are at risk for this type of infection. Your sexual activity has changed since you were last screened, and you are at increased risk for chlamydia or gonorrhea. Ask your health care provider if you are at risk. Ask your health care provider about whether you are at high risk for HIV.  Your health care provider may recommend a prescription medicine to help prevent HIV infection. If you choose to take medicine to prevent HIV, you should first get tested for HIV. You should then be tested every 3 months for as long as you are taking the medicine. Follow these instructions at home: Lifestyle Do not use any products that contain nicotine or tobacco, such as cigarettes, e-cigarettes, and chewing tobacco. If you need help quitting, ask your health care provider. Do not use street drugs. Do not share needles. Ask your health care provider for help if you need support or information about quitting drugs. Alcohol use Do not drink alcohol if your health care provider tells you not to drink. If you drink alcohol: Limit how much you have to 0-2 drinks a day. Be aware of how much alcohol is in your drink. In the [place] one drink equals one 12 oz bottle of beer (355 mL), one 5 oz glass of wine (148 mL), or one 1 oz glass of hard liquor (44 mL). General instructions Schedule regular health, dental, and eye exams. Stay current with your vaccines. Tell your health care provider if: You often feel depressed. You have ever been abused or do not feel safe at home. Summary Adopting a healthy lifestyle and getting preventive care are important in promoting health and wellness. Follow your health care provider's instructions about healthy diet, exercising, and getting tested or screened for diseases. Follow your health care provider's instructions on monitoring your cholesterol and blood pressure. This information is not intended to replace advice given to you by your health care provider. Make sure you discuss any questions you have with your healthcare provider. Document Revised: 09/16/2018 Document Reviewed: 09/16/2018 Elsevier Patient Education  2022 [employer] Inc[employer]  

## 2021-05-28 NOTE — Progress Notes (Signed)
[facility]ia  Subjective:  Patient ID: Mark [name], male    DOB: 12/09/1960  Age: 60 y.o. MRN: [id number]  CC: Annual Exam (Patient states that he has had difficulty with swallowing pills x6 months)  This visit occurred during the SARS-CoV-2 public health emergency.  Safety protocols were in place, including screening questions prior to the visit, additional usage of staff PPE, and extensive cleaning of exam room while observing appropriate contact time as indicated for disinfecting solutions.    [name] presents for a CPX and f/up -  He complains of a 72-monthhistory of dysphagia with pills, food, and liquids.  He has had minimal odynophagia.  He is taking the PPI but also takes ibuprofen for musculoskeletal pain.  He is very active and tells me he does 200 push-ups each night.  With activity he does not experience chest pain, shortness of breath, diaphoresis, edema, or fatigue.  Outpatient Medications Prior to Visit  Medication Sig Dispense Refill   allopurinol (ZYLOPRIM) 300 MG tablet TAKE 1 TABLET BY MOUTH  DAILY 90 tablet 0   Cholecalciferol (VITAMIN D3) 10 MCG (400 UNIT) tablet Take by mouth.     diclofenac Sodium (VOLTAREN) 1 % GEL Apply 4 g topically 4 (four) times daily. 350 g 1   EPINEPHrine 0.3 mg/0.3 mL IJ SOAJ injection Inject 0.3 mLs (0.3 mg total) into the muscle as needed (ANAPHYLAXIS). 2 each 2   Glucosamine-Chondroit-Vit C-Mn (GLUCOSAMINE 1500 COMPLEX PO) Take 1 tablet by mouth daily.      metoprolol succinate (TOPROL-XL) 50 MG 24 hr tablet TAKE 1 TABLET BY MOUTH  DAILY WITH OR IMMEDIATELY  FOLLOWING A MEAL 90 tablet 0   pantoprazole (PROTONIX) 40 MG tablet TAKE 1 TABLET BY MOUTH  DAILY 90 tablet 0   rizatriptan (MAXALT) 10 MG tablet Take 1 tablet (10 mg total) by mouth as needed for migraine. May repeat in 2 hours if needed 10 tablet 5   rosuvastatin (CRESTOR) 10 MG tablet TAKE 1 TABLET BY MOUTH  DAILY 90 tablet 0   triamcinolone (NASACORT) 55 MCG/ACT AERO nasal inhaler  Place 2 sprays into the nose daily. 32.4 mL 5   Vitamin D, Ergocalciferol, (DRISDOL) 50000 units CAPS capsule Take 1 capsule (50,000 Units total) by mouth every 7 (seven) days. 12 capsule 0   gabapentin (NEURONTIN) 100 MG capsule TAKE 2 CAPSULES (200 MG TOTAL) BY MOUTH AT BEDTIME. 60 capsule 3   No facility-administered medications prior to visit.    ROS Review of Systems  Constitutional:  Negative for diaphoresis, fatigue and unexpected weight change.  HENT:  Positive for trouble swallowing. Negative for sore throat and voice change.   Respiratory:  Negative for cough, chest tightness and shortness of breath.   Cardiovascular:  Negative for chest pain, palpitations and leg swelling.  Gastrointestinal:  Negative for abdominal pain, blood in stool, constipation, diarrhea, nausea and vomiting.  Genitourinary: Negative.  Negative for difficulty urinating, dysuria, hematuria, scrotal swelling and testicular pain.  Musculoskeletal:  Positive for arthralgias. Negative for myalgias.  Skin: Negative.   Neurological: Negative.  Negative for dizziness, weakness, light-headedness and headaches.  Hematological:  Negative for adenopathy. Does not bruise/bleed easily.  Psychiatric/Behavioral: Negative.     Objective:  BP 122/70 (BP Location: Right Arm, Patient Position: Sitting, Cuff Size: Large)   Pulse 68   Temp 98.6 F (37 C) (Oral)   Ht '5\' 8"'$  (1.727 m)   Wt 210 lb (95.3 kg)   SpO2 97%   BMI 31.93 kg/m  BP Readings from Last 3 Encounters:  05/28/21 122/70  04/30/21 120/80  07/20/20 112/70    Wt Readings from Last 3 Encounters:  05/28/21 210 lb (95.3 kg)  04/30/21 207 lb (93.9 kg)  07/20/20 216 lb (98 kg)    Physical Exam Vitals reviewed.  Constitutional:      Appearance: Normal appearance.  HENT:     Nose: Nose normal.     Mouth/Throat:     Mouth: Mucous membranes are moist.  Eyes:     Conjunctiva/sclera: Conjunctivae normal.  Cardiovascular:     Rate and Rhythm: Normal  rate and regular rhythm.     Heart sounds: No murmur heard. Pulmonary:     Effort: Pulmonary effort is normal.     Breath sounds: No stridor. No wheezing, rhonchi or rales.  Abdominal:     General: Bowel sounds are normal. There is no distension.     Palpations: Abdomen is soft. There is no fluid wave, hepatomegaly, splenomegaly or mass.     Tenderness: There is no abdominal tenderness. There is no guarding.     Hernia: There is no hernia in the left inguinal area or right inguinal area.  Genitourinary:    Pubic Area: No rash.      Penis: Normal and circumcised.      Testes: Normal.     Epididymis:     Right: Normal.     Left: Normal.     Prostate: Normal. Not enlarged, not tender and no nodules present.     Rectum: Normal. Guaiac result negative. No mass, tenderness, anal fissure, external hemorrhoid or internal hemorrhoid. Normal anal tone.  Musculoskeletal:        General: No tenderness. Normal range of motion.     Cervical back: Neck supple.  Lymphadenopathy:     Cervical: No cervical adenopathy.     Lower Body: No right inguinal adenopathy. No left inguinal adenopathy.  Skin:    General: Skin is warm and dry.     Findings: No rash.  Neurological:     General: No focal deficit present.     Mental Status: He is alert.  Psychiatric:        Mood and Affect: Mood normal.        Behavior: Behavior normal.    Lab Results  Component Value Date   WBC 7.8 05/09/2020   HGB 14.6 05/09/2020   HCT 43.3 05/09/2020   PLT 242 05/09/2020   GLUCOSE 94 05/09/2020   CHOL 152 05/09/2020   TRIG 108 05/09/2020   HDL 47 05/09/2020   LDLCALC 85 05/09/2020   ALT 18 05/09/2020   AST 27 05/09/2020   NA 139 05/09/2020   K 4.7 05/09/2020   CL 101 05/09/2020   CREATININE 1.12 05/09/2020   BUN 15 05/09/2020   CO2 26 05/09/2020   TSH 1.580 05/09/2020   PSA 0.33 07/02/2016   HGBA1C 5.9 (H) 05/09/2020    MR ANKLE RIGHT WO CONTRAST  Result Date: 08/08/2020 CLINICAL DATA:  Posterior  ankle pain. History of prior Achilles tendon repair. EXAM: MRI OF THE RIGHT ANKLE WITHOUT CONTRAST TECHNIQUE: Multiplanar, multisequence MR imaging of the ankle was performed. No intravenous contrast was administered. COMPARISON:  None. FINDINGS: TENDONS Peroneal: Intact Posteromedial: Intact Anterior: Intact Achilles: Marked thickening and abnormal signal intensity in the distal Achilles tendon. There is a fairly extensive partial-thickness tear just above the superior margin of the calcaneus. This measures approximately 17.5 x 11.5 mm. No complete retracted tear is identified. There  is some associated reactive marrow edema in the calcaneus. Plantar Fascia: Intact LIGAMENTS Lateral: Intact Medial: Intact CARTILAGE Ankle Joint: No significant degenerative changes, joint effusion or osteochondral abnormality. Subtalar Joints/Sinus Tarsi: The subtalar joints are maintained. There is a small amount of fluid in the posterior talocalcaneal joint. The sinus tarsi is unremarkable except for a small amount of fluid or inflammation. The cervical and interosseous ligaments are intact. Bones: Mild reactive marrow edema in the calcaneus but I do not see a discrete fracture or bone lesion. Other: The ankle and foot musculature are grossly normal. IMPRESSION: 1. Postoperative changes involving the Achilles tendon. Marked thickening and abnormal signal intensity in the distal tendon. There is a fairly extensive partial-thickness tear just above the superior margin of the calcaneus. No complete retracted tear is identified. 2. Mild reactive marrow edema in the calcaneus but no discrete stress fracture. 3. Intact medial and lateral ankle ligaments and tendons. Electronically Signed   By: Marijo Sanes M.D.   On: 08/08/2020 16:39    Assessment & Plan:   Mark [name] was seen today for annual exam.  Diagnoses and all orders for this visit:  Essential hypertension, benign- His blood pressure is adequately well controlled. -     CBC  with Differential/Platelet; Future -     Basic metabolic panel; Future -     TSH; Future -     Hepatic function panel; Future  Idiopathic gout, unspecified chronicity, unspecified site- I will monitor his uric acid level. -     Uric acid; Future  Routine general medical examination at a health care facility- Exam completed, labs reviewed, vaccines reviewed and updated, cancer screenings addressed, patient education material was given. -     Lipid panel; Future -     PSA; Future  Need for vaccination -     Tdap vaccine greater than or equal to 7yo IM  Esophageal dysphagia- Will continue the PPI.  I recommended that he see GI to be screened for upper GI pathology. -     Ambulatory referral to Gastroenterology  I have discontinued Aymen Featherston's gabapentin. I am also having him maintain his Glucosamine-Chondroit-Vit C-Mn (GLUCOSAMINE 1500 COMPLEX PO), rizatriptan, Vitamin D (Ergocalciferol), triamcinolone, Vitamin D3, EPINEPHrine, diclofenac Sodium, pantoprazole, rosuvastatin, allopurinol, and metoprolol succinate.  No orders of the defined types were placed in this encounter.    Follow-up: Return in about 6 months (around 11/28/2021).  Scarlette Calico, MD

## 2021-06-12 NOTE — Progress Notes (Signed)
[place] Phone: [phone] Subjective:   Mark [name], am serving as a scribe for Dr. Hulan Saas. This visit occurred during the SARS-CoV-2 public health emergency.  Safety protocols were in place, including screening questions prior to the visit, additional usage of staff PPE, and extensive cleaning of exam room while observing appropriate contact time as indicated for disinfecting solutions.   I'm seeing this patient by the request  of:  Mark Lima, MD  CC: Arm pain follow-up  [id number]  04/30/2021 I believe the patient will have significant arthritis of the right elbow.  I think it is more osteoarthritis.  Potentially traumatic.  Patient also has done steroid injections previously.  Follow-up with me again in 4 to 8 weeks.  Partial tearing noted.  Patient does have what appears to be more of a bony abnormality as well over the olecranon.  Questionable stress reaction.  Discussed posture and ergonomics, discussed with compression.  Patient will avoid certain activities.  Follow-up with me again 6 to 8 weeks.  Updated 06/13/2021 Mark [name] is a 60 y.o. male coming in with complaint of arm pain.  Previously seen and had a partial triceps tendon tear.  He last seen 6 weeks ago.  Patient was to do home exercises, compression, icing.  Patient states that he is [name] better. Has burning sensation over olecranon with compression. Pain is constant and worse in flexed position.   Xray IMPRESSION: 1. [name] fracture or dislocation of the bilateral elbows. 2. There is severe right and mild left elbow joint arthrosis. 3. Soft tissue edema over the olecranon bilaterally.     Past Medical History:  Diagnosis Date   Frequent headaches    GERD (gastroesophageal reflux disease)    H/O cardiac arrhythmia    H/O phlebitis    Heart murmur    History of chicken pox    Hyperlipidemia    Hypertension    Past Surgical  History:  Procedure Laterality Date   HERNIA REPAIR     Left elbow radial collateral ligament repair  09/2012   Social History   Socioeconomic History   Marital status: Married    Spouse name: Not on file   Number of children: Not on file   Years of education: Not on file   Highest education level: Not on file  Occupational History   Not on file  Tobacco Use   Smoking status: Never   Smokeless tobacco: Never  Substance and Sexual Activity   Alcohol use: [name]   Drug use: [name]   Sexual activity: Yes  Other Topics Concern   Not on file  Social History Narrative   Not on file   Social Determinants of Health   Financial Resource Strain: Not on file  Food Insecurity: Not on file  Transportation Needs: Not on file  Physical Activity: Not on file  Stress: Not on file  Social Connections: Not on file   Allergies  Allergen Reactions   Cold Buster [Zinc] Hives   Percocet [Oxycodone-Acetaminophen]    Family History  Problem Relation Age of Onset   Lung cancer Mother    Arthritis Mother    Hyperlipidemia Mother    Hypertension Mother    Alcohol abuse Father    Heart disease Father    Prostate cancer Father    Hyperlipidemia Father    Stroke Father    Hypertension Father    Heart attack Father  Alcohol abuse Sister    Drug abuse Sister    Alcohol abuse Brother    Heart attack Brother    Anemia Paternal Grandfather      Current Outpatient Medications (Cardiovascular):    EPINEPHrine 0.3 mg/0.3 mL IJ SOAJ injection, Inject 0.3 mLs (0.3 mg total) into the muscle as needed (ANAPHYLAXIS).   metoprolol succinate (TOPROL-XL) 50 MG 24 hr tablet, TAKE 1 TABLET BY MOUTH  DAILY WITH OR IMMEDIATELY  FOLLOWING A MEAL   rosuvastatin (CRESTOR) 10 MG tablet, TAKE 1 TABLET BY MOUTH  DAILY  Current Outpatient Medications (Respiratory):    triamcinolone (NASACORT) 55 MCG/ACT AERO nasal inhaler, Place 2 sprays into the nose daily.  Current Outpatient Medications (Analgesics):     allopurinol (ZYLOPRIM) 300 MG tablet, TAKE 1 TABLET BY MOUTH  DAILY   rizatriptan (MAXALT) 10 MG tablet, Take 1 tablet (10 mg total) by mouth as needed for migraine. May repeat in 2 hours if needed   Current Outpatient Medications (Other):    Cholecalciferol (VITAMIN D3) 10 MCG (400 UNIT) tablet, Take by mouth.   diclofenac Sodium (VOLTAREN) 1 % GEL, Apply 4 g topically 4 (four) times daily.   Glucosamine-Chondroit-Vit C-Mn (GLUCOSAMINE 1500 COMPLEX PO), Take 1 tablet by mouth daily.    pantoprazole (PROTONIX) 40 MG tablet, TAKE 1 TABLET BY MOUTH  DAILY   Vitamin D, Ergocalciferol, (DRISDOL) 50000 units CAPS capsule, Take 1 capsule (50,000 Units total) by mouth every 7 (seven) days.    Review of Systems:  [name] headache, visual changes, nausea, vomiting, diarrhea, constipation, dizziness, abdominal pain, skin rash, fevers, chills, night sweats, weight loss, swollen lymph nodes, body aches, joint swelling, chest pain, shortness of breath, mood changes. POSITIVE muscle aches  Objective  Blood pressure 104/80, pulse 71, height '5\' 8"'$  (1.727 m), weight 202 lb (91.6 kg), SpO2 96 %.   General: [name] apparent distress alert and oriented x3 mood and affect normal, dressed appropriately.  HEENT: Pupils equal, extraocular movements intact  Respiratory: Patient's speak in full sentences and does not appear short of breath  Cardiovascular: [name] lower extremity edema, non tender, [name] erythema  Gait antalgic  Arthritis of ankles with pronation of the foot.  Patient does have breakdown of the transverse arch as well of the feet bilaterally.  Limited range of motion of the right ankle secondary to underlying arthritic changes.  Right elbow exam shows the patient does have severe decrease in range of motion with lacking the last 15 degrees of extension in the last 5 degrees of flexion.  Also lacks some supination and pronation.  Left elbow has tenderness only more over the olecranon area as well as over the tricep.   [name] erythema or swelling noted.   Limited muscular skeletal ultrasound was performed and interpreted by Hulan Saas, M  Limited ultrasound of patient's left elbow shows the patient does have calcific changes noted of the distal tricep at the insertion of the olecranon. Impression: Calcific changes of the tricep at the olecranon insertion.  [name] significant increase in hypoechoic changes.   Impression and Recommendations:     The above documentation has been reviewed and is accurate and complete Lyndal Pulley, DO

## 2021-06-13 ENCOUNTER — Encounter: Payer: Self-pay | Admitting: Family Medicine

## 2021-06-13 ENCOUNTER — Other Ambulatory Visit: Payer: Self-pay

## 2021-06-13 ENCOUNTER — Ambulatory Visit: Payer: Self-pay

## 2021-06-13 ENCOUNTER — Ambulatory Visit: Payer: 59 | Admitting: Family Medicine

## 2021-06-13 VITALS — BP 104/80 | HR 71 | Ht 68.0 in | Wt 202.0 lb

## 2021-06-13 DIAGNOSIS — M216X2 Other acquired deformities of left foot: Secondary | ICD-10-CM | POA: Insufficient documentation

## 2021-06-13 DIAGNOSIS — M19021 Primary osteoarthritis, right elbow: Secondary | ICD-10-CM

## 2021-06-13 DIAGNOSIS — M65222 Calcific tendinitis, left upper arm: Secondary | ICD-10-CM | POA: Diagnosis not present

## 2021-06-13 DIAGNOSIS — M25521 Pain in right elbow: Secondary | ICD-10-CM | POA: Diagnosis not present

## 2021-06-13 DIAGNOSIS — M216X1 Other acquired deformities of right foot: Secondary | ICD-10-CM | POA: Diagnosis not present

## 2021-06-13 NOTE — Assessment & Plan Note (Signed)
Pronation of feet, causing maybe back pain, responded to orthotics previously we will refer him for orthotics again.  He does have arthritic changes as well as has had difficulty with the Achilles on the right side.

## 2021-06-13 NOTE — Assessment & Plan Note (Signed)
Significant arthritic changes of the right elbow.  At this point I would like to refer patient to orthopedic surgery to discuss the potential for placement.  Patient is in agreement with the plan

## 2021-06-13 NOTE — Assessment & Plan Note (Signed)
Calcific tendinitis of the upper arm.  Patient does have more of the tricep on the left side.  Discussed potential shockwave therapy with sports medicine fellowship and will be referred accordingly.

## 2021-06-13 NOTE — Patient Instructions (Signed)
Referral to Sports Med Fellow and [place] doing what you are doing, including the supplements See you again in 6-8 weeks

## 2021-06-26 ENCOUNTER — Other Ambulatory Visit: Payer: Self-pay | Admitting: Internal Medicine

## 2021-06-26 DIAGNOSIS — I471 Supraventricular tachycardia: Secondary | ICD-10-CM

## 2021-06-26 DIAGNOSIS — M1000 Idiopathic gout, unspecified site: Secondary | ICD-10-CM

## 2021-06-26 DIAGNOSIS — I10 Essential (primary) hypertension: Secondary | ICD-10-CM

## 2021-06-26 DIAGNOSIS — K219 Gastro-esophageal reflux disease without esophagitis: Secondary | ICD-10-CM

## 2021-06-26 DIAGNOSIS — E785 Hyperlipidemia, unspecified: Secondary | ICD-10-CM

## 2021-06-27 ENCOUNTER — Ambulatory Visit: Payer: 59 | Admitting: Family Medicine

## 2021-06-27 VITALS — Ht 68.0 in | Wt 205.0 lb

## 2021-06-27 DIAGNOSIS — M778 Other enthesopathies, not elsewhere classified: Secondary | ICD-10-CM

## 2021-06-27 NOTE — Progress Notes (Signed)
   Mark [name] is a 59 y.o. male who presents to [facility] today for the following:  Left elbow pain Patient has been followed by Dr. Hulan Saas for left elbow pain and recently had an ultrasound that showed calcific tendinitis within his triceps He is very physically fit and does 200-300 push-ups per night States he has been having significant pain with this as well as tricep curls for quite some time He has tried formal physical therapy did not help He states that Voltaren gel minimally improved his pain He has to lay in bed with his arm straight at night otherwise he has significant pain States that he was referred here for shockwave therapy for his calcific tendinitis of his tricep He also mentions that he has right elbow pain for which he follows with Dr. Tamala Julian, has limited range of motion and was advised to seek consultation from an orthopedic surgeon in regards to surgery   PMH reviewed.  ROS as above. Medications reviewed.  Exam:  Ht 5\' 8"  (1.727 m)   Wt 205 lb (93 kg)   BMI 31.17 kg/m  Gen: Well NAD MSK:  Left Elbow: - Inspection: no obvious deformity b/l. No swelling, erythema or bruising b/l - Palpation: He has some TTP at distal triceps insertion at olecranon - ROM: full active ROM in flexion and extension b/l. No crepitus - Strength: 5/5 strength in wrist flexion and extension without pain b/l. 5/5 strength in biceps, triceps b/l. - Neuro: NV intact distally b/l - Special testing: no laxity with varus/valgus stress    Assessment and Plan: 1) Tendinitis of left triceps Given the patient has calcific changes within his triceps tendon and has failed conservative measures, shockwave therapy would be a reasonable option for him.  Discussed risk and benefits and he opted to proceed.  We will perform a trial today, which he tolerated well.  He can follow-up for weekly treatments for the next 3 weeks if he would like to complete 4 weeks total of treatment.  Procedure:  ECSWT Indications:   Left triceps calcific tendinosis   Procedure Details Consent: Risks of procedure as well as the alternatives and risks of each were explained to the patient.  Written consent for procedure obtained. Time Out: Verified patient identification, verified procedure, site was marked, verified correct patient position, medications/allergies/relevent history reviewed.  The area was cleaned with alcohol swab.     The  left distal triceps insertion was targeted for Extracorporeal shockwave therapy.    Preset:  Calcific tendinitis of shoulder Power Level: 90 Frequency: 10 Impulse/cycles: 2000 Head size: large   Patient tolerated procedure well without immediate complications     Ari[place] Constable, D.O.  PGY-4 [facility]  06/27/2021 5:21 PM  Addendum:  Patient seen in the office by fellow.  Her history, exam, plan of care were precepted with me.  [name] MD [name]

## 2021-06-27 NOTE — Assessment & Plan Note (Signed)
Given the patient has calcific changes within his triceps tendon and has failed conservative measures, shockwave therapy would be a reasonable option for him.  Discussed risk and benefits and he opted to proceed.  We will perform a trial today, which he tolerated well.  He can follow-up for weekly treatments for the next 3 weeks if he would like to complete 4 weeks total of treatment.  Procedure: ECSWT Indications:   Left triceps calcific tendinosis   Procedure Details Consent: Risks of procedure as well as the alternatives and risks of each were explained to the patient.  Written consent for procedure obtained. Time Out: Verified patient identification, verified procedure, site was marked, verified correct patient position, medications/allergies/relevent history reviewed.  The area was cleaned with alcohol swab.     The  left distal triceps insertion was targeted for Extracorporeal shockwave therapy.    Preset:  Calcific tendinitis of shoulder Power Level: 90 Frequency: 10 Impulse/cycles: 2000 Head size: large   Patient tolerated procedure well without immediate complications

## 2021-07-03 NOTE — Assessment & Plan Note (Addendum)
Doing well.  Planning for 3-4 treatments.  F/U 1 week for next session.  Procedure: ECSWT Indications:  left triceps calcific tendinitis   Procedure Details Consent: Risks of procedure as well as the alternatives and risks of each were explained to the patient.  Written consent for procedure obtained. Time Out: Verified patient identification, verified procedure, site was marked, verified correct patient position, medications/allergies/relevent history reviewed.  The area was cleaned with alcohol swab.     The left triceps tendon was targeted for Extracorporeal shockwave therapy.    Preset: chronic calcific tendinitis of shoulder Power Level: 120 Frequency: 16 Impulse/cycles: 2500 Head size: large   Patient tolerated procedure well without immediate complications

## 2021-07-03 NOTE — Progress Notes (Signed)
   Mark [name] is a 60 y.o. male who presents to [facility] today for the following:  Left triceps calcific tendinitis  1st shockwave therapy 1 week ago Did very well with this treatment Had improvement in pain Felt like now he can sleep without keeping his arm straight Now also feeling some pain in his flexor carpi ulnaris and wants to know if he can have some treatment there today  PMH reviewed.  ROS as above. Medications reviewed.  Exam:  There were no vitals taken for this visit. Gen: Well NAD MSK:  Left Elbow: - Inspection: no obvious deformity b/l. No swelling, erythema or bruising b/l - Palpation: No TTP  - ROM: extension limited by 5 degrees on left - Neuro: NV intact distally b/l  Assessment and Plan: 1) Tendinitis of left triceps Doing well.  Planning for 3-4 treatments.  F/U 1 week for next session.  Procedure: ECSWT Indications:  left triceps calcific tendinitis   Procedure Details Consent: Risks of procedure as well as the alternatives and risks of each were explained to the patient.  Written consent for procedure obtained. Time Out: Verified patient identification, verified procedure, site was marked, verified correct patient position, medications/allergies/relevent history reviewed.  The area was cleaned with alcohol swab.     The left triceps tendon was targeted for Extracorporeal shockwave therapy.    Preset: chronic calcific tendinitis of shoulder Power Level: 120 Frequency: 16 Impulse/cycles: 2500 Head size: large   Patient tolerated procedure well without immediate complications     Arizona Constable, D.O.  PGY-4 [facility]  07/04/2021 3:47 PM  Addendum:  Patient seen in the office by fellow.  Her history, exam, plan of care were precepted with me.  [name] MD [name]

## 2021-07-04 ENCOUNTER — Ambulatory Visit (INDEPENDENT_AMBULATORY_CARE_PROVIDER_SITE_OTHER): Payer: Self-pay | Admitting: Family Medicine

## 2021-07-04 DIAGNOSIS — M778 Other enthesopathies, not elsewhere classified: Secondary | ICD-10-CM

## 2021-07-10 ENCOUNTER — Encounter: Payer: Self-pay | Admitting: *Deleted

## 2021-07-11 ENCOUNTER — Ambulatory Visit (INDEPENDENT_AMBULATORY_CARE_PROVIDER_SITE_OTHER): Payer: Self-pay | Admitting: Family Medicine

## 2021-07-11 VITALS — Ht 68.0 in

## 2021-07-11 DIAGNOSIS — M778 Other enthesopathies, not elsewhere classified: Secondary | ICD-10-CM

## 2021-07-11 NOTE — Assessment & Plan Note (Signed)
Continues to do well.  Plans for 4 treatments in his left elbow and is considering scheduling treatment for his right elbow as well.  Procedure: ECSWT Indications:   Left triceps calcific tendinitis   Procedure Details Consent: Risks of procedure as well as the alternatives and risks of each were explained to the patient.  Written consent for procedure obtained. Time Out: Verified patient identification, verified procedure, site was marked, verified correct patient position, medications/allergies/relevent history reviewed.  The area was cleaned with alcohol swab.     The  left triceps tendon as well as flexor carpi ulnaris was targeted for Extracorporeal shockwave therapy.    Preset:  Chronic calcific tendinitis of shoulder Power Level: 120 Frequency: 16 Impulse/cycles: 2500 Head size: large   Patient tolerated procedure well without immediate complications

## 2021-07-11 NOTE — Progress Notes (Signed)
   Mark [name] is a 60 y.o. male who presents to [facility] today for the following:  Left triceps calcific tendinitis Presents today for third shockwave therapy Reports he is doing very well He is now able to do tricep exercises without significant pain which she has not been able to do previously Also feeling improvement in his pain within his flexor carpi ulnaris after treatment last week Overall very happy with his treatment He is thinking about presenting for right elbow treatment as well   PMH reviewed.  ROS as above. Medications reviewed.  Exam:  Ht 5\' 8"  (1.727 m)   BMI 31.17 kg/m  Gen: Well NAD MSK:  Left elbow Inspection: No obvious deformity, erythema, bruising overlying He is neurovascularly intact distally   Assessment and Plan: 1) Tendinitis of left triceps Continues to do well.  Plans for 4 treatments in his left elbow and is considering scheduling treatment for his right elbow as well.  Procedure: ECSWT Indications:   Left triceps calcific tendinitis   Procedure Details Consent: Risks of procedure as well as the alternatives and risks of each were explained to the patient.  Written consent for procedure obtained. Time Out: Verified patient identification, verified procedure, site was marked, verified correct patient position, medications/allergies/relevent history reviewed.  The area was cleaned with alcohol swab.     The  left triceps tendon as well as flexor carpi ulnaris was targeted for Extracorporeal shockwave therapy.    Preset:  Chronic calcific tendinitis of shoulder Power Level: 120 Frequency: 16 Impulse/cycles: 2500 Head size: large   Patient tolerated procedure well without immediate complications     Mark [name], D.O.  PGY-4 [facility]  07/11/2021 3:47 PM  Addendum:  Patient seen in the office by fellow.  Her history, exam, plan of care were precepted with me.  [name] MD [name]

## 2021-07-18 ENCOUNTER — Ambulatory Visit (INDEPENDENT_AMBULATORY_CARE_PROVIDER_SITE_OTHER): Payer: Self-pay | Admitting: Family Medicine

## 2021-07-18 VITALS — Ht 68.0 in

## 2021-07-18 DIAGNOSIS — M7711 Lateral epicondylitis, right elbow: Secondary | ICD-10-CM | POA: Insufficient documentation

## 2021-07-18 DIAGNOSIS — M778 Other enthesopathies, not elsewhere classified: Secondary | ICD-10-CM

## 2021-07-18 NOTE — Assessment & Plan Note (Signed)
Performed fourth shockwave therapy today.  Doing very well.  He will decide if he would like to repeat any further treatments on this elbow.  Procedure: ECSWT Indications:   Left triceps calcific tendinitis   Procedure Details Consent: Risks of procedure as well as the alternatives and risks of each were explained to the patient.  Written consent for procedure obtained. Time Out: Verified patient identification, verified procedure, site was marked, verified correct patient position, medications/allergies/relevent history reviewed.  The area was cleaned with alcohol swab.     The  left triceps tendon and flexor carpi ulnaris was targeted for Extracorporeal shockwave therapy.    Preset:  Chronic calcific tendinitis of shoulder Power Level:  120 Frequency: 16 Impulse/cycles: 2500 Head size:  Large   Patient tolerated procedure well without immediate complications

## 2021-07-18 NOTE — Progress Notes (Signed)
   Mark [name] is a 60 y.o. male who presents to [facility] today for the following:  Left triceps calcific tendinitis Patient presents for her fourth shockwave therapy on this side Overall he is doing well and continues to have significant improvement  Right elbow pain Known severe osteoarthritis Because he has had some much improvement in his left elbow, hoping that he may also have some improvement in his right elbow with treatment of his triceps tendon Reports that his symptoms are mostly at the medial and lateral epicondyle and that this has been happening for quite some time He is wanting to try shockwave therapy in this region before he goes through more invasive treatments   PMH reviewed.  ROS as above. Medications reviewed.  Exam:  Ht 5\' 8"  (1.727 m)   BMI 31.17 kg/m  Gen: Well NAD MSK:  Bilateral elbows Inspection: No obvious erythema, bruising overlying bilateral elbows.  He does have decreased extension bilaterally, worse on the right.  He is neurovascularly intact distally. Palpation: Mild TTP right medial and lateral epicondyles Strength: 5/5 strength in all fields, however has pain with resisted wrist extension specifically extension of the third finger on the right.  No results found.   Assessment and Plan: 1) Tendinitis of left triceps Performed fourth shockwave therapy today.  Doing very well.  He will decide if he would like to repeat any further treatments on this elbow.  Procedure: ECSWT Indications:   Left triceps calcific tendinitis   Procedure Details Consent: Risks of procedure as well as the alternatives and risks of each were explained to the patient.  Written consent for procedure obtained. Time Out: Verified patient identification, verified procedure, site was marked, verified correct patient position, medications/allergies/relevent history reviewed.  The area was cleaned with alcohol swab.     The  left triceps tendon and flexor carpi ulnaris was  targeted for Extracorporeal shockwave therapy.    Preset:  Chronic calcific tendinitis of shoulder Power Level:  120 Frequency: 16 Impulse/cycles: 2500 Head size:  Large   Patient tolerated procedure well without immediate complications     Right lateral epicondylitis Symptoms are most consistent with lateral epicondylitis and likely has a small component of medial epicondylitis, discussed risk and benefits of trialing shockwave therapy and he would like to proceed today.  Performed treatment 1 today, will plan for 4 treatments.  He will follow-up next week for his second treatment.  Procedure: ECSWT Indications:   Right medial and lateral epicondylitis   Procedure Details Consent: Risks of procedure as well as the alternatives and risks of each were explained to the patient.  Written consent for procedure obtained. Time Out: Verified patient identification, verified procedure, site was marked, verified correct patient position, medications/allergies/relevent history reviewed.  The area was cleaned with alcohol swab.     The  medial and lateral epicondyle  were targeted for Extracorporeal shockwave therapy.    Preset:  Radial and ulnar epicondylitis Power Level:  120 Frequency: 16 Impulse/cycles: 2500 Head size:  Large   Patient tolerated procedure well without immediate complications     Arizona Constable, D.O.  PGY-4 [facility]  07/18/2021 3:50 PM  Addendum:  Patient seen in the office by fellow.  Her history, exam, plan of care were precepted with me.  [name] MD [name]

## 2021-07-18 NOTE — Assessment & Plan Note (Signed)
Symptoms are most consistent with lateral epicondylitis and likely has a small component of medial epicondylitis, discussed risk and benefits of trialing shockwave therapy and he would like to proceed today.  Performed treatment 1 today, will plan for 4 treatments.  He will follow-up next week for his second treatment.  Procedure: ECSWT Indications:   Right medial and lateral epicondylitis   Procedure Details Consent: Risks of procedure as well as the alternatives and risks of each were explained to the patient.  Written consent for procedure obtained. Time Out: Verified patient identification, verified procedure, site was marked, verified correct patient position, medications/allergies/relevent history reviewed.  The area was cleaned with alcohol swab.     The  medial and lateral epicondyle  were targeted for Extracorporeal shockwave therapy.    Preset:  Radial and ulnar epicondylitis Power Level:  120 Frequency: 16 Impulse/cycles: 2500 Head size:  Large   Patient tolerated procedure well without immediate complications

## 2021-07-25 ENCOUNTER — Ambulatory Visit: Payer: Self-pay | Admitting: Family Medicine

## 2021-07-25 NOTE — Progress Notes (Deleted)
   Mark [name] is a 60 y.o. male who presents to [facility] today for the following:  Right Elbow Pain Had first shockwace therapy 10/12 Over medial and lateral epicondyle    PMH reviewed.  ROS as above. Medications reviewed.  Exam:  There were no vitals taken for this visit. Gen: Well NAD MSK:  Right Elbow ***  No results found.   Assessment and Plan: 1) No problem-specific Assessment & Plan notes found for this encounter.   [place] Constable, D.O.  PGY-4 [facility]  07/25/2021 8:08 AM

## 2021-07-30 ENCOUNTER — Ambulatory Visit (INDEPENDENT_AMBULATORY_CARE_PROVIDER_SITE_OTHER): Payer: 59 | Admitting: Family Medicine

## 2021-07-30 ENCOUNTER — Other Ambulatory Visit: Payer: Self-pay

## 2021-07-30 ENCOUNTER — Ambulatory Visit: Payer: Self-pay

## 2021-07-30 ENCOUNTER — Encounter: Payer: Self-pay | Admitting: Family Medicine

## 2021-07-30 VITALS — BP 124/80 | HR 67 | Ht 68.0 in | Wt 215.0 lb

## 2021-07-30 DIAGNOSIS — M25522 Pain in left elbow: Secondary | ICD-10-CM | POA: Diagnosis not present

## 2021-07-30 NOTE — Patient Instructions (Signed)
I recommend you obtained a compression sleeve to help with your joint problems. There are many options on the market however I recommend obtaining a full elbow Body Helix compression sleeve.  You can find information (including how to appropriate measure yourself for sizing) can be found at www.Body http://www.lambert.com/.  Many of these products are health savings account (HSA) eligible.   You can use the compression sleeve at any time throughout the day but is most important to use while being active as well as for 2 hours post-activity.  It is appropriate to ice following activity with the compression sleeve in place.   Recheck 2 weeks

## 2021-07-30 NOTE — Progress Notes (Signed)
I, Wendy Poet, LAT, ATC, am serving as scribe for Dr. Lynne Leader.  Mark [name] is a 60 y.o. male who presents to [place] at [facility] today for f/u of L elbow pain.  He was last seen by Dr. Tamala Julian on 06/13/21 for B elbow pain (L triceps tendinitis and R elbow arthritis) and was referred for shockwave therapy for his L elbow/triceps of which he's had 4 sessions.  Today, pt reports he hit his L elbow on a ladder this morning. Pt notes prior dx of a "stress fx" in his L elbow. Pt has notable swelling along the olecrannon process of his L elbow, but no pain.  Diagnostic testing: R and L elbow XR- 04/30/21  Pertinent review of systems: No fevers or chills  Relevant historical information: SVT.  Hypertension.  History of left shoulder injury in the past requiring surgery.   Exam:  BP 124/80   Pulse 67   Ht 5\' 8"  (1.727 m)   Wt 215 lb (97.5 kg)   SpO2 97%   BMI 32.69 kg/m  General: Well Developed, well nourished, and in no acute distress.   MSK: Left elbow: Swelling overlying olecranon prominence.  Nontender to palpation although somewhat firm. Elbow range of motion intact.  Strength is intact.  Pulses cap refill and sensation are intact distally.    Lab and Radiology Results  Diagnostic Limited MSK Ultrasound of: Left elbow Heterogeneous swelling overlying the olecranon with hypoechoic fluid and isoechoic structure consistent with partially coagulated hematoma. No significant active bleeding or increased vascular activity on Doppler. Normal-appearing bony structures. Impression: Acute hemorrhagic olecranon bursitis now coagulated   EXAM: LEFT ELBOW - COMPLETE 3+ VIEW; RIGHT ELBOW - COMPLETE 3+ VIEW   COMPARISON:  None.   FINDINGS: No fracture or dislocation of the bilateral elbows. There is severe right elbow joint arthrosis with large osteophytes and joint space narrowing. There is mild left elbow joint arthrosis. No elbow joint effusion. Soft tissue  edema over the olecranon bilaterally.   IMPRESSION: 1. No fracture or dislocation of the bilateral elbows. 2. There is severe right and mild left elbow joint arthrosis. 3. Soft tissue edema over the olecranon bilaterally.     Electronically Signed   By: Eddie Candle M.D.   On: 05/01/2021 14:51   I, Lynne Leader, personally (independently) visualized and performed the interpretation of the images attached in this note.    Assessment and Plan: 60 y.o. male with traumatic olecranon bursitis.  Functionally doing quite well in clinic today.  Unfortunately the hematoma is not aspirin ratable at this time.  Plan to treat with compression and padding and relative rest.  Okay to continue working if tolerated. Recheck in 2 weeks.  We will repeat ultrasound very likely.  At that time if asked for ratable we will attempt however likely this can go away on its own with appropriate treatment.   PDMP not reviewed this encounter. Orders Placed This Encounter  Procedures   [place] LIMITED JOINT SPACE STRUCTURES UP LEFT(NO LINKED CHARGES)    Order Specific Question:   Reason for Exam (SYMPTOM  OR DIAGNOSIS REQUIRED)    Answer:   L elbow pain    Order Specific Question:   Preferred imaging location?    Answer:   [place]   No orders of the defined types were placed in this encounter.    Discussed warning signs or symptoms. Please see discharge instructions. Patient expresses understanding.   The above documentation  has been reviewed and is accurate and complete Lynne Leader, M.D.

## 2021-07-31 NOTE — Progress Notes (Signed)
   Mark [name] is a 60 y.o. male who presents to [facility] today for the following:  Right lateral epicondylitis First shockwave therapy for this was on 10/5 Presents today for third treatment Overall thinks that he has had some improvement  In regards to his left triceps calcific tendinitis, he was doing well, but fell off a ladder and hit his elbow for which she was seen by Dr. Georgina Snell on 10/24 But thinks that his left arm is improving pain wise   PMH reviewed.  ROS as above. Medications reviewed.  Exam:  Ht 5\' 8"  (1.727 m)   BMI 32.69 kg/m  Gen: Well NAD MSK:  Right elbow Inspection: No obvious deformities of the right elbow, he does have decreased range of motion previously noted given his known osteoarthritis Palpation: No significant tenderness palpation of the lateral epicondyle Neurovascular: He is neurovascularly intact distally  No results found.   Assessment and Plan: 1) Right lateral epicondylitis Doing well.  Plan for at least one more treatment on the right.  Procedure: ECSWT Indications:   Right lateral epicondylitis   Procedure Details Consent: Risks of procedure as well as the alternatives and risks of each were explained to the patient.  Written consent for procedure obtained. Time Out: Verified patient identification, verified procedure, site was marked, verified correct patient position, medications/allergies/relevent history reviewed.  The area was cleaned with alcohol swab.     The right lateral epicondyle and common extensor tendon, triceps tendon, brachioradialis, was targeted for Extracorporeal shockwave therapy.    Preset: radial and ulnar epicondylitis Power Level:  120 Frequency: 16 Impulse/cycles: 3200: 2000 focused at lateral epicondyle and common extensor tendon, 700 focused at triceps tendon, 500 focused at brachioradialis Head size: large   Patient tolerated procedure well without immediate complications     [place] Constable, D.O.   PGY-4 [facility]  08/01/2021 4:25 PM  Addendum:  I was the preceptor for this visit and available for immediate consultation.  [name] MD [name]

## 2021-08-01 ENCOUNTER — Ambulatory Visit (INDEPENDENT_AMBULATORY_CARE_PROVIDER_SITE_OTHER): Payer: Self-pay | Admitting: Family Medicine

## 2021-08-01 VITALS — Ht 68.0 in

## 2021-08-01 DIAGNOSIS — M7711 Lateral epicondylitis, right elbow: Secondary | ICD-10-CM

## 2021-08-01 NOTE — Assessment & Plan Note (Addendum)
Doing well.  Plan for at least one more treatment on the right.  Procedure: ECSWT Indications:   Right lateral epicondylitis   Procedure Details Consent: Risks of procedure as well as the alternatives and risks of each were explained to the patient.  Written consent for procedure obtained. Time Out: Verified patient identification, verified procedure, site was marked, verified correct patient position, medications/allergies/relevent history reviewed.  The area was cleaned with alcohol swab.     The right lateral epicondyle and common extensor tendon, triceps tendon, brachioradialis, was targeted for Extracorporeal shockwave therapy.    Preset: radial and ulnar epicondylitis Power Level:  120 Frequency: 16 Impulse/cycles: 3200: 2000 focused at lateral epicondyle and common extensor tendon, 700 focused at triceps tendon, 500 focused at brachioradialis Head size: large   Patient tolerated procedure well without immediate complications

## 2021-08-08 ENCOUNTER — Other Ambulatory Visit: Payer: Self-pay

## 2021-08-08 ENCOUNTER — Ambulatory Visit: Payer: 59 | Admitting: Family Medicine

## 2021-08-08 VITALS — BP 108/76 | HR 76 | Ht 68.0 in | Wt 214.6 lb

## 2021-08-08 DIAGNOSIS — M7022 Olecranon bursitis, left elbow: Secondary | ICD-10-CM | POA: Diagnosis not present

## 2021-08-08 NOTE — Patient Instructions (Signed)
Thank you for coming in today.   Continue compression and padding.   Recheck as needed.   I can inject cortisone if needed.

## 2021-08-08 NOTE — Progress Notes (Signed)
   I, Peterson Lombard, LAT, ATC acting as a scribe for Lynne Leader, MD.  Mark [name] is a 60 y.o. male who presents to [place] at [facility] today for  f/u of L elbow pain due to traumatic olecranon bursitis. Pt was last seen by Dr. Georgina Snell on 07/30/21 and was advised to treat with compression and padding and relative rest. Pt was seen by Dr. Sandi Carne on 08/01/21 for his 3rd shockwave therapy. Today, pt reports L elbow is improving. Pt feels like the shockwave therapy is helpful.  Dx imaging: 04/30/21 L elbow XR  Pertinent review of systems: No fevers or chills  Relevant historical information: Calcific tendinitis   Exam:  BP 108/76   Pulse 76   Ht 5\' 8"  (1.727 m)   Wt 214 lb 9.6 oz (97.3 kg)   SpO2 97%   BMI 32.63 kg/m  General: Well Developed, well nourished, and in no acute distress.   MSK: Left elbow olecranon slightly swollen nontender Elbow motion is intact.  Strength is intact.     Assessment and Plan: 60 y.o. male with left elbow olecranon bursitis due to trauma significantly improved with compression and time.  Watchful waiting at this point.  Continued conservative management with compression.  Check back as needed.  Precautions reviewed.  Certainly could proceed with steroid injection if needed in the future.  Discussed warning signs or symptoms. Please see discharge instructions. Patient expresses understanding.   The above documentation has been reviewed and is accurate and complete Lynne Leader, M.D.  Total encounter time 20 minutes including face-to-face time with the patient and, reviewing past medical record, and charting on the date of service.   Treatment plan and options in the future

## 2021-08-09 ENCOUNTER — Ambulatory Visit: Payer: Self-pay | Admitting: Family Medicine

## 2021-08-13 ENCOUNTER — Other Ambulatory Visit: Payer: Self-pay | Admitting: Orthopedic Surgery

## 2021-08-13 DIAGNOSIS — M19021 Primary osteoarthritis, right elbow: Secondary | ICD-10-CM

## 2021-08-14 ENCOUNTER — Ambulatory Visit: Payer: 59 | Admitting: Family Medicine

## 2021-09-10 ENCOUNTER — Ambulatory Visit
Admission: RE | Admit: 2021-09-10 | Discharge: 2021-09-10 | Disposition: A | Discharge status: Home / Self Care | Payer: 59 | Source: Ambulatory Visit | Attending: Orthopedic Surgery | Admitting: Orthopedic Surgery

## 2021-09-10 DIAGNOSIS — M19021 Primary osteoarthritis, right elbow: Secondary | ICD-10-CM

## 2021-12-10 ENCOUNTER — Telehealth: Payer: Self-pay | Admitting: Internal Medicine

## 2021-12-10 ENCOUNTER — Other Ambulatory Visit: Payer: Self-pay | Admitting: Internal Medicine

## 2021-12-10 DIAGNOSIS — K219 Gastro-esophageal reflux disease without esophagitis: Secondary | ICD-10-CM

## 2021-12-10 DIAGNOSIS — E785 Hyperlipidemia, unspecified: Secondary | ICD-10-CM

## 2021-12-10 NOTE — Telephone Encounter (Signed)
Pt states he is having abnormal "Arrhythmia" which is causing sob ? ?Pt states it is currently under control and requesting to see provider in the next 2 wks ? ?Advised pt provider's 1st availability is 4-5, pt declined appt ? ?Offered pt an appt w/ another [facility] provider. Pt declined stating his provider is familiar w/ his condition ? ?Pt requesting a c/b ? ? ?

## 2021-12-11 ENCOUNTER — Encounter: Payer: Self-pay | Admitting: Internal Medicine

## 2021-12-11 ENCOUNTER — Other Ambulatory Visit: Payer: Self-pay | Admitting: Internal Medicine

## 2021-12-11 DIAGNOSIS — I471 Supraventricular tachycardia: Secondary | ICD-10-CM

## 2021-12-11 DIAGNOSIS — M1000 Idiopathic gout, unspecified site: Secondary | ICD-10-CM

## 2021-12-11 DIAGNOSIS — I10 Essential (primary) hypertension: Secondary | ICD-10-CM

## 2021-12-11 NOTE — Telephone Encounter (Signed)
Corresponding with pt via MyChart message that he imitated in regard. See MyChart encounter for more details.  ?

## 2021-12-19 ENCOUNTER — Encounter: Payer: Self-pay | Admitting: Internal Medicine

## 2021-12-19 ENCOUNTER — Ambulatory Visit: Payer: 59 | Admitting: Internal Medicine

## 2021-12-19 ENCOUNTER — Other Ambulatory Visit: Payer: Self-pay

## 2021-12-19 VITALS — BP 126/78 | HR 62 | Temp 98.0°F | Ht 68.0 in | Wt 223.0 lb

## 2021-12-19 DIAGNOSIS — Z0000 Encounter for general adult medical examination without abnormal findings: Secondary | ICD-10-CM

## 2021-12-19 DIAGNOSIS — E785 Hyperlipidemia, unspecified: Secondary | ICD-10-CM

## 2021-12-19 DIAGNOSIS — K219 Gastro-esophageal reflux disease without esophagitis: Secondary | ICD-10-CM | POA: Diagnosis not present

## 2021-12-19 DIAGNOSIS — I471 Supraventricular tachycardia, unspecified: Secondary | ICD-10-CM

## 2021-12-19 DIAGNOSIS — R002 Palpitations: Secondary | ICD-10-CM

## 2021-12-19 DIAGNOSIS — M1000 Idiopathic gout, unspecified site: Secondary | ICD-10-CM

## 2021-12-19 DIAGNOSIS — Z1211 Encounter for screening for malignant neoplasm of colon: Secondary | ICD-10-CM | POA: Insufficient documentation

## 2021-12-19 DIAGNOSIS — R739 Hyperglycemia, unspecified: Secondary | ICD-10-CM | POA: Diagnosis not present

## 2021-12-19 DIAGNOSIS — I10 Essential (primary) hypertension: Secondary | ICD-10-CM

## 2021-12-19 DIAGNOSIS — I739 Peripheral vascular disease, unspecified: Secondary | ICD-10-CM

## 2021-12-19 LAB — PSA: PSA: 0.3

## 2021-12-19 NOTE — Patient Instructions (Signed)
Palpitations ?Palpitations are feelings that your heartbeat is irregular or is faster than normal. It may feel like your heart is fluttering or skipping a beat. Palpitations may be caused by many things, including smoking, caffeine, alcohol, stress, and certain medicines or drugs. Most causes of palpitations are not serious.  ?However, some palpitations can be a sign of a serious problem. Further tests and a thorough medical history will be done to find the cause of your palpitations. Your provider may order tests such as an ECG, labs, an echocardiogram, or an ambulatory continuous ECG monitor. ?Follow these instructions at home: ?Pay attention to any changes in your symptoms. Let your health care provider know about them. Take these actions to help manage your symptoms: ?Eating and drinking ?Follow instructions from your health care provider about eating or drinking restrictions. You may need to avoid foods and drinks that may cause palpitations. These may include: ?Caffeinated coffee, tea, soft drinks, and energy drinks. ?Chocolate. ?Alcohol. ?Diet pills. ?Lifestyle ?  ?Take steps to reduce your stress and anxiety. Things that can help you relax include: ?Yoga. ?Mind-body activities, such as deep breathing, meditation, or using words and images to create positive thoughts (guided imagery). ?Physical activity, such as swimming, jogging, or walking. Tell your health care provider if your palpitations increase with activity. If you have chest pain or shortness of breath with activity, do not continue the activity until you are seen by your health care provider. ?Biofeedback. This is a method that helps you learn to use your mind to control things in your body, such as your heartbeat. ?Get plenty of rest and sleep. Keep a regular bed time. ?Do not use drugs, including cocaine or ecstasy. Do not use marijuana. ?Do not use any products that contain nicotine or tobacco. These products include cigarettes, chewing tobacco,  and vaping devices, such as e-cigarettes. If you need help quitting, ask your health care provider. ?General instructions ?Take over-the-counter and prescription medicines only as told by your health care provider. ?Keep all follow-up visits. This is important. These may include visits for further testing if palpitations do not go away or get worse. ?Contact a health care provider if: ?You continue to have a fast or irregular heartbeat for a long period of time. ?You notice that your palpitations occur more often. ?Get help right away if: ?You have chest pain or shortness of breath. ?You have a severe headache. ?You feel dizzy or you faint. ?These symptoms may represent a serious problem that is an emergency. Do not wait to see if the symptoms will go away. Get medical help right away. Call your local emergency services (911 in the [place] Do not drive yourself to the hospital. ?Summary ?Palpitations are feelings that your heartbeat is irregular or is faster than normal. It may feel like your heart is fluttering or skipping a beat. ?Palpitations may be caused by many things, including smoking, caffeine, alcohol, stress, certain medicines, and drugs. ?Further tests and a thorough medical history may be done to find the cause of your palpitations. ?Get help right away if you faint or have chest pain, shortness of breath, severe headache, or dizziness. ?This information is not intended to replace advice given to you by your health care provider. Make sure you discuss any questions you have with your health care provider. ?Document Revised: 02/14/2021 Document Reviewed: 02/14/2021 ?Elsevier Patient Education ? [place]. ? ?

## 2021-12-19 NOTE — Progress Notes (Signed)
? ?Subjective:  ?Patient ID: Mark [name], male    DOB: [birth year]  Age: 61 y.o. MRN: [id number] ? ?CC: Palpitations ? ?This visit occurred during the SARS-CoV-2 public health emergency.  Safety protocols were in place, including screening questions prior to the visit, additional usage of staff PPE, and extensive cleaning of exam room while observing appropriate contact time as indicated for disinfecting solutions.   ? ?HPI ?Mark [name] presents for f/up - ? ?He complains of a 2-week history of palpitations.  He was previously diagnosed with SVT and says his symptoms were well controlled until 2 weeks ago.  The palpitations have lasted longer and are associated with symptoms that he has not had previously like weakness, dizziness, and lightheadedness.  He has not felt presyncopal and has had no syncope.  He doubled the dose of metoprolol and says the palpitations have resolved for the most part.  He is active and denies chest pain, shortness of breath, diaphoresis, or edema.  He has had a longstanding history of burning in his feet but for the last few months he has noticed pain in his calves when he walks.  He also complains that his feet feel cold. ? ? ?Outpatient Medications Prior to Visit  ?Medication Sig Dispense Refill  ? allopurinol (ZYLOPRIM) 300 MG tablet TAKE 1 TABLET BY MOUTH  DAILY 90 tablet 1  ? Cholecalciferol (VITAMIN D3) 10 MCG (400 UNIT) tablet Take by mouth.    ? diclofenac Sodium (VOLTAREN) 1 % GEL Apply 4 g topically 4 (four) times daily. 350 g 1  ? EPINEPHrine 0.3 mg/0.3 mL IJ SOAJ injection Inject 0.3 mLs (0.3 mg total) into the muscle as needed (ANAPHYLAXIS). 2 each 2  ? Glucosamine-Chondroit-Vit C-Mn (GLUCOSAMINE 1500 COMPLEX PO) Take 1 tablet by mouth daily.     ? metoprolol succinate (TOPROL-XL) 50 MG 24 hr tablet TAKE 1 TABLET BY MOUTH  DAILY WITH OR IMMEDIATELY  FOLLOWING A MEAL 90 tablet 1  ? pantoprazole (PROTONIX) 40 MG tablet TAKE 1 TABLET BY MOUTH  DAILY 90 tablet 1  ? rizatriptan  (MAXALT) 10 MG tablet Take 1 tablet (10 mg total) by mouth as needed for migraine. May repeat in 2 hours if needed 10 tablet 5  ? triamcinolone (NASACORT) 55 MCG/ACT AERO nasal inhaler Place 2 sprays into the nose daily. 32.4 mL 5  ? Vitamin D, Ergocalciferol, (DRISDOL) 50000 units CAPS capsule Take 1 capsule (50,000 Units total) by mouth every 7 (seven) days. 12 capsule 0  ? rosuvastatin (CRESTOR) 10 MG tablet TAKE 1 TABLET BY MOUTH  DAILY 90 tablet 1  ? ?No facility-administered medications prior to visit.  ? ? ?ROS ?Review of Systems  ?Constitutional:  Positive for unexpected weight change (wt gain). Negative for chills, diaphoresis and fatigue.  ?HENT: Negative.  Negative for trouble swallowing.   ?Eyes: Negative.   ?Respiratory:  Negative for cough, chest tightness, shortness of breath and wheezing.   ?Cardiovascular:  Positive for palpitations. Negative for chest pain and leg swelling.  ?Gastrointestinal:  Positive for constipation. Negative for abdominal pain, diarrhea and nausea.  ?Genitourinary: Negative.   ?Musculoskeletal:  Positive for arthralgias. Negative for myalgias.  ?Skin: Negative.  Negative for color change and pallor.  ?Neurological:  Positive for dizziness, weakness and light-headedness. Negative for syncope and speech difficulty.  ?Hematological:  Negative for adenopathy. Does not bruise/bleed easily.  ?Psychiatric/Behavioral: Negative.    ? ?Objective:  ?BP 126/78 (BP Location: Left Arm, Patient Position: Sitting, Cuff Size: Large)   Pulse 62  Temp 98 ?F (36.7 ?C) (Oral)   Ht '5\' 8"'$  (1.727 m)   Wt 223 lb (101.2 kg)   SpO2 98%   BMI 33.91 kg/m?  ? ?BP Readings from Last 3 Encounters:  ?12/19/21 126/78  ?08/08/21 108/76  ?07/30/21 124/80  ? ? ?Wt Readings from Last 3 Encounters:  ?12/19/21 223 lb (101.2 kg)  ?08/08/21 214 lb 9.6 oz (97.3 kg)  ?07/30/21 215 lb (97.5 kg)  ? ? ?Physical Exam ?Vitals reviewed.  ?HENT:  ?   Nose: Nose normal.  ?   Mouth/Throat:  ?   Mouth: Mucous membranes are  moist.  ?Eyes:  ?   General: No scleral icterus. ?   Conjunctiva/sclera: Conjunctivae normal.  ?Cardiovascular:  ?   Rate and Rhythm: Normal rate and regular rhythm.  ?   Pulses:     ?     Dorsalis pedis pulses are 1+ on the right side and 1+ on the left side.  ?     Posterior tibial pulses are 0 on the right side and 1+ on the left side.  ?   Heart sounds: No murmur heard. ?  No gallop.  ?   Comments: EKG - ?NSR, 62 bpm ?Low voltage c/w body ?Normal EKG ?Pulmonary:  ?   Effort: Pulmonary effort is normal.  ?   Breath sounds: No stridor. No wheezing, rhonchi or rales.  ?Abdominal:  ?   General: Abdomen is protuberant. Bowel sounds are normal. There is no distension.  ?   Palpations: Abdomen is soft. There is no hepatomegaly, splenomegaly or mass.  ?   Tenderness: There is no abdominal tenderness.  ?Musculoskeletal:  ?   Cervical back: Neck supple.  ?   Right lower leg: No edema.  ?   Left lower leg: No edema.  ?Feet:  ?   Right foot:  ?   Skin integrity: Skin integrity normal.  ?   Left foot:  ?   Skin integrity: Skin integrity normal.  ?Lymphadenopathy:  ?   Cervical: No cervical adenopathy.  ?Neurological:  ?   Mental Status: He is alert.  ? ? ?Lab Results  ?Component Value Date  ? WBC 7.7 12/19/2021  ? HGB 14.8 12/19/2021  ? HCT 42.8 12/19/2021  ? PLT 232 12/19/2021  ? GLUCOSE 80 12/19/2021  ? CHOL 138 12/19/2021  ? TRIG 188 (H) 12/19/2021  ? HDL 41 12/19/2021  ? [place] 66 12/19/2021  ? ALT 19 12/19/2021  ? AST 28 12/19/2021  ? NA 141 12/19/2021  ? K 5.0 12/19/2021  ? CL 103 12/19/2021  ? CREATININE 1.30 (H) 12/19/2021  ? BUN 24 12/19/2021  ? CO2 26 12/19/2021  ? TSH 1.120 12/19/2021  ? PSA 0.33 07/02/2016  ? HGBA1C 5.8 (H) 12/19/2021  ? ? ?CT ELBOW RIGHT WO CONTRAST ? ?Result Date: 09/11/2021 ?CLINICAL DATA:  Chronic right elbow pain. EXAM: CT OF THE UPPER RIGHT EXTREMITY WITHOUT CONTRAST TECHNIQUE: Multidetector CT imaging of the right elbow was performed according to the standard protocol. COMPARISON:  Right  elbow x-rays dated April 30, 2021. FINDINGS: Bones/Joint/Cartilage No fracture or dislocation. Moderate radiocapitellar and ulnotrochlear joint space narrowing with bulky marginal osteophytes. Multiple small intra-articular bodies. No significant joint effusion. Ligaments Ligaments are suboptimally evaluated by CT. Muscles and Tendons Grossly intact.  No muscle atrophy. Soft tissue No fluid collection or hematoma.  No soft tissue mass. IMPRESSION: 1. Moderate elbow osteoarthritis with multiple small intra-articular bodies. Electronically Signed   By: Orville Govern.D.  On: 09/11/2021 08:54  ? ? ?Assessment & Plan:  ? ?Tranquilino was seen today for palpitations. ? ?Diagnoses and all orders for this visit: ? ?SVT (supraventricular tachycardia) ([place])- I recommended he undergo a cardiac event monitor to screen for recurrence of SVT and to evaluate for other dysrhythmias. ?-     CARDIAC EVENT MONITOR; Future ?-     EKG 12-Lead ? ?Essential hypertension, benign- His blood pressure is adequately well controlled. ?-     Basic metabolic panel; Future ?-     Urinalysis, Routine w reflex microscopic; Future ?-     Hepatic function panel; Future ?-     Hepatic function panel ?-     Urinalysis, Routine w reflex microscopic ?-     Basic metabolic panel ? ?Gastroesophageal reflux disease without esophagitis ?-     CBC with Differential/Platelet; Future ?-     CBC with Differential/Platelet ? ?Hyperglycemia ?-     Basic metabolic panel; Future ?-     Hemoglobin A1c; Future ?-     Hemoglobin A1c ?-     Basic metabolic panel ? ?Routine general medical examination at a health care facility ?-     Lipid panel; Future ?-     PSA; Future ?-     PSA ?-     Lipid panel ? ?Hyperlipidemia with target LDL less than 100- LDL goal achieved. Doing well on the statin  ?-     TSH; Future ?-     Hepatic function panel; Future ?-     Hepatic function panel ?-     TSH ?-     rosuvastatin (CRESTOR) 10 MG tablet; Take 1 tablet (10 mg total) by mouth  daily. ? ?Idiopathic gout, unspecified chronicity, unspecified site- He has achieved his uric acid goal. ?-     Uric acid; Future ?-     Uric acid ? ?Palpitations ?-     CARDIAC EVENT MONITOR; Future ? ?[place]

## 2021-12-20 LAB — CBC WITH DIFFERENTIAL/PLATELET
Basophils Absolute: 0 10*3/uL (ref 0.0–0.2)
Basos: 0 %
EOS (ABSOLUTE): 0.1 10*3/uL (ref 0.0–0.4)
Eos: 2 %
Hematocrit: 42.8 % (ref 37.5–51.0)
Hemoglobin: 14.8 g/dL (ref 13.0–17.7)
Immature Grans (Abs): 0 10*3/uL (ref 0.0–0.1)
Immature Granulocytes: 0 %
Lymphocytes Absolute: 2.1 10*3/uL (ref 0.7–3.1)
Lymphs: 27 %
MCH: 31 pg (ref 26.6–33.0)
MCHC: 34.6 g/dL (ref 31.5–35.7)
MCV: 90 fL (ref 79–97)
Monocytes Absolute: 0.8 10*3/uL (ref 0.1–0.9)
Monocytes: 10 %
Neutrophils Absolute: 4.7 10*3/uL (ref 1.4–7.0)
Neutrophils: 61 %
Platelets: 232 10*3/uL (ref 150–450)
RBC: 4.78 x10E6/uL (ref 4.14–5.80)
RDW: 12.2 % (ref 11.6–15.4)
WBC: 7.7 10*3/uL (ref 3.4–10.8)

## 2021-12-20 LAB — BASIC METABOLIC PANEL
BUN/Creatinine Ratio: 18 (ref 10–24)
BUN: 24 mg/dL (ref 8–27)
CO2: 26 mmol/L (ref 20–29)
Calcium: 9.2 mg/dL (ref 8.6–10.2)
Chloride: 103 mmol/L (ref 96–106)
Creatinine, Ser: 1.3 mg/dL — ABNORMAL HIGH (ref 0.76–1.27)
Glucose: 80 mg/dL (ref 70–99)
Potassium: 5 mmol/L (ref 3.5–5.2)
Sodium: 141 mmol/L (ref 134–144)
eGFR: 63 mL/min/{1.73_m2} (ref >59)

## 2021-12-20 LAB — HEPATIC FUNCTION PANEL
ALT: 19 IU/L (ref 0–44)
AST: 28 IU/L (ref 0–40)
Albumin: 4.5 g/dL (ref 3.8–4.8)
Alkaline Phosphatase: 87 IU/L (ref 44–121)
Bilirubin Total: 0.7 mg/dL (ref 0.0–1.2)
Bilirubin, Direct: 0.17 mg/dL (ref 0.00–0.40)
Total Protein: 6.4 g/dL (ref 6.0–8.5)

## 2021-12-20 LAB — URINALYSIS, ROUTINE W REFLEX MICROSCOPIC
Bilirubin, UA: NEGATIVE
Glucose, UA: NEGATIVE
Ketones, UA: NEGATIVE
Leukocytes,UA: NEGATIVE
Nitrite, UA: NEGATIVE
Protein,UA: NEGATIVE
RBC, UA: NEGATIVE
Specific Gravity, UA: 1.025 (ref 1.005–1.030)
Urobilinogen, Ur: 0.2 mg/dL (ref 0.2–1.0)
pH, UA: 5 (ref 5.0–7.5)

## 2021-12-20 LAB — LIPID PANEL
Chol/HDL Ratio: 3.4 ratio (ref 0.0–5.0)
Cholesterol, Total: 138 mg/dL (ref 100–199)
HDL: 41 mg/dL (ref >39)
LDL Chol Calc (NIH): 66 mg/dL (ref 0–99)
Triglycerides: 188 mg/dL — ABNORMAL HIGH (ref 0–149)
VLDL Cholesterol Cal: 31 mg/dL (ref 5–40)

## 2021-12-20 LAB — URIC ACID: Uric Acid: 5.1 mg/dL (ref 3.8–8.4)

## 2021-12-20 LAB — HEMOGLOBIN A1C
Est. average glucose Bld gHb Est-mCnc: 120 mg/dL
Hgb A1c MFr Bld: 5.8 % — ABNORMAL HIGH (ref 4.8–5.6)

## 2021-12-20 LAB — PSA: Prostate Specific Ag, Serum: 0.3 ng/mL (ref 0.0–4.0)

## 2021-12-20 LAB — TSH: TSH: 1.12 u[IU]/mL (ref 0.450–4.500)

## 2021-12-21 MED ORDER — ROSUVASTATIN CALCIUM 10 MG PO TABS: 10.0000 mg | ORAL_TABLET | Freq: Every day | ORAL | 1 refills | Status: DC

## 2021-12-24 ENCOUNTER — Ambulatory Visit (HOSPITAL_COMMUNITY)
Admission: RE | Admit: 2021-12-24 | Discharge: 2021-12-24 | Disposition: A | Discharge status: Home / Self Care | Payer: 59 | Source: Ambulatory Visit | Attending: Internal Medicine | Admitting: Internal Medicine

## 2021-12-24 ENCOUNTER — Other Ambulatory Visit: Payer: Self-pay

## 2021-12-24 DIAGNOSIS — I739 Peripheral vascular disease, unspecified: Secondary | ICD-10-CM | POA: Insufficient documentation

## 2021-12-25 ENCOUNTER — Encounter: Payer: Self-pay | Admitting: Internal Medicine

## 2021-12-26 ENCOUNTER — Encounter: Payer: Self-pay | Admitting: Internal Medicine

## 2021-12-27 ENCOUNTER — Ambulatory Visit: Payer: 59

## 2022-01-02 ENCOUNTER — Ambulatory Visit: Payer: 59

## 2022-01-13 ENCOUNTER — Ambulatory Visit (INDEPENDENT_AMBULATORY_CARE_PROVIDER_SITE_OTHER): Payer: 59

## 2022-01-13 DIAGNOSIS — I471 Supraventricular tachycardia: Secondary | ICD-10-CM | POA: Diagnosis not present

## 2022-01-13 DIAGNOSIS — R002 Palpitations: Secondary | ICD-10-CM

## 2022-03-17 ENCOUNTER — Encounter: Payer: Self-pay | Admitting: Internal Medicine

## 2022-03-19 ENCOUNTER — Other Ambulatory Visit: Payer: Self-pay | Admitting: Internal Medicine

## 2022-03-19 DIAGNOSIS — M1000 Idiopathic gout, unspecified site: Secondary | ICD-10-CM

## 2022-03-19 DIAGNOSIS — K219 Gastro-esophageal reflux disease without esophagitis: Secondary | ICD-10-CM

## 2022-03-19 DIAGNOSIS — I471 Supraventricular tachycardia: Secondary | ICD-10-CM

## 2022-03-19 DIAGNOSIS — I10 Essential (primary) hypertension: Secondary | ICD-10-CM

## 2022-03-19 DIAGNOSIS — E785 Hyperlipidemia, unspecified: Secondary | ICD-10-CM

## 2022-03-19 MED ORDER — METOPROLOL SUCCINATE ER 50 MG PO TB24: ORAL_TABLET | ORAL | 1 refills | Status: DC

## 2022-03-19 MED ORDER — PANTOPRAZOLE SODIUM 40 MG PO TBEC: 40.0000 mg | DELAYED_RELEASE_TABLET | Freq: Every day | ORAL | 1 refills | Status: DC

## 2022-03-19 MED ORDER — ROSUVASTATIN CALCIUM 10 MG PO TABS: 10.0000 mg | ORAL_TABLET | Freq: Every day | ORAL | 1 refills | Status: DC

## 2022-03-19 MED ORDER — ALLOPURINOL 300 MG PO TABS: 300.0000 mg | ORAL_TABLET | Freq: Every day | ORAL | 1 refills | Status: DC

## 2022-04-01 ENCOUNTER — Other Ambulatory Visit: Payer: Self-pay | Admitting: Internal Medicine

## 2022-04-01 DIAGNOSIS — I471 Supraventricular tachycardia: Secondary | ICD-10-CM

## 2022-04-01 DIAGNOSIS — I10 Essential (primary) hypertension: Secondary | ICD-10-CM

## 2022-04-01 MED ORDER — METOPROLOL SUCCINATE ER 50 MG PO TB24: ORAL_TABLET | ORAL | 1 refills | Status: DC

## 2022-04-15 ENCOUNTER — Other Ambulatory Visit: Payer: Self-pay | Admitting: Internal Medicine

## 2022-04-15 DIAGNOSIS — I10 Essential (primary) hypertension: Secondary | ICD-10-CM

## 2022-04-15 DIAGNOSIS — I471 Supraventricular tachycardia: Secondary | ICD-10-CM

## 2022-04-15 MED ORDER — METOPROLOL SUCCINATE ER 50 MG PO TB24: 50.0000 mg | ORAL_TABLET | Freq: Every day | ORAL | 1 refills | Status: DC

## 2022-04-15 NOTE — Telephone Encounter (Signed)
PT called and stated [facility] is requesting a call. They need clarification on the dosage for metoprolol succinate (TOPROL-XL) 50 MG 24 hr tablet in order to release the rx to the pt. They are requesting a call to the MD line for clarification ([phone]).

## 2022-05-20 ENCOUNTER — Ambulatory Visit: Payer: 59 | Admitting: Internal Medicine

## 2022-05-20 ENCOUNTER — Encounter: Payer: Self-pay | Admitting: Internal Medicine

## 2022-05-20 VITALS — BP 118/76 | HR 75 | Temp 98.3°F | Ht 68.0 in | Wt 206.0 lb

## 2022-05-20 DIAGNOSIS — I10 Essential (primary) hypertension: Secondary | ICD-10-CM | POA: Diagnosis not present

## 2022-05-20 DIAGNOSIS — L989 Disorder of the skin and subcutaneous tissue, unspecified: Secondary | ICD-10-CM

## 2022-05-20 DIAGNOSIS — L502 Urticaria due to cold and heat: Secondary | ICD-10-CM | POA: Diagnosis not present

## 2022-05-20 DIAGNOSIS — I471 Supraventricular tachycardia: Secondary | ICD-10-CM | POA: Diagnosis not present

## 2022-05-20 DIAGNOSIS — D1801 Hemangioma of skin and subcutaneous tissue: Secondary | ICD-10-CM | POA: Insufficient documentation

## 2022-05-20 DIAGNOSIS — M5416 Radiculopathy, lumbar region: Secondary | ICD-10-CM

## 2022-05-20 DIAGNOSIS — Z23 Encounter for immunization: Secondary | ICD-10-CM | POA: Diagnosis not present

## 2022-05-20 DIAGNOSIS — L570 Actinic keratosis: Secondary | ICD-10-CM | POA: Insufficient documentation

## 2022-05-20 MED ORDER — EPINEPHRINE 0.3 MG/0.3ML IJ SOAJ: 0.3000 mg | INTRAMUSCULAR | 2 refills | Status: AC | PRN

## 2022-05-20 NOTE — Progress Notes (Unsigned)
Subjective:  Patient ID: Mark [name], male    DOB: 04-Aug-1961  Age: 61 y.o. MRN: [id number]  CC: Back Pain   [name] presents for f/up --  He complains of a 28-monthhistory of right lower back pain that radiates into his right lower extremity with numbness in his right foot.  He is controlling the pain with ibuprofen.  Outpatient Medications Prior to Visit  Medication Sig Dispense Refill   allopurinol (ZYLOPRIM) 300 MG tablet Take 1 tablet (300 mg total) by mouth daily. 90 tablet 1   Cholecalciferol (VITAMIN D3) 10 MCG (400 UNIT) tablet Take by mouth.     diclofenac Sodium (VOLTAREN) 1 % GEL Apply 4 g topically 4 (four) times daily. 350 g 1   Glucosamine-Chondroit-Vit C-Mn (GLUCOSAMINE 1500 COMPLEX PO) Take 1 tablet by mouth daily.      metoprolol succinate (TOPROL-XL) 50 MG 24 hr tablet Take 1 tablet (50 mg total) by mouth daily. Take with or immediately following a meal. 90 tablet 1   pantoprazole (PROTONIX) 40 MG tablet Take 1 tablet (40 mg total) by mouth daily. 90 tablet 1   rizatriptan (MAXALT) 10 MG tablet Take 1 tablet (10 mg total) by mouth as needed for migraine. May repeat in 2 hours if needed 10 tablet 5   rosuvastatin (CRESTOR) 10 MG tablet Take 1 tablet (10 mg total) by mouth daily. 90 tablet 1   triamcinolone (NASACORT) 55 MCG/ACT AERO nasal inhaler Place 2 sprays into the nose daily. 32.4 mL 5   Vitamin D, Ergocalciferol, (DRISDOL) 50000 units CAPS capsule Take 1 capsule (50,000 Units total) by mouth every 7 (seven) days. 12 capsule 0   EPINEPHrine 0.3 mg/0.3 mL IJ SOAJ injection Inject 0.3 mLs (0.3 mg total) into the muscle as needed (ANAPHYLAXIS). 2 each 2   No facility-administered medications prior to visit.    ROS Review of Systems  Constitutional:  Negative for chills, diaphoresis, fatigue and fever.  HENT: Negative.    Eyes: Negative.   Respiratory: Negative.  Negative for cough, chest tightness, shortness of breath and wheezing.    Cardiovascular:  Negative for chest pain, palpitations and leg swelling.  Gastrointestinal:  Negative for abdominal pain, constipation, diarrhea, nausea and vomiting.  Endocrine: Negative.   Genitourinary: Negative.  Negative for difficulty urinating.  Musculoskeletal:  Positive for back pain. Negative for myalgias and neck pain.  Skin:  Positive for rash. Negative for color change and pallor.  Allergic/Immunologic: Negative.   Neurological:  Positive for numbness. Negative for dizziness and weakness.  Hematological:  Negative for adenopathy. Does not bruise/bleed easily.  Psychiatric/Behavioral: Negative.      Objective:  BP 118/76 (BP Location: Right Arm, Patient Position: Sitting, Cuff Size: Large)   Pulse 75   Temp 98.3 F (36.8 C) (Oral)   Ht '5\' 8"'$  (1.727 m)   Wt 206 lb (93.4 kg)   SpO2 97%   BMI 31.32 kg/m   BP Readings from Last 3 Encounters:  05/20/22 118/76  12/19/21 126/78  08/08/21 108/76    Wt Readings from Last 3 Encounters:  05/20/22 206 lb (93.4 kg)  12/19/21 223 lb (101.2 kg)  08/08/21 214 lb 9.6 oz (97.3 kg)    Physical Exam Vitals reviewed.  Constitutional:      Appearance: Normal appearance. He is not ill-appearing.  HENT:     Nose: Nose normal.  Eyes:     General: No scleral icterus.    Pupils: Pupils are equal, round, and reactive to light.  Cardiovascular:     Rate and Rhythm: Regular rhythm. Bradycardia present.     Pulses: Normal pulses.     Heart sounds: Normal heart sounds.  Pulmonary:     Breath sounds: No stridor. No wheezing, rhonchi or rales.  Abdominal:     General: Abdomen is flat.     Palpations: There is no mass.     Tenderness: There is no abdominal tenderness. There is no guarding.     Hernia: No hernia is present.  Musculoskeletal:        General: Normal range of motion.     Cervical back: Neck supple.  Skin:    General: Skin is warm and dry.     Comments: Fleshy papules and macules on bilateral temporal surfaces.   Neurological:     General: No focal deficit present.     Mental Status: He is alert.     Cranial Nerves: No cranial nerve deficit.     Sensory: Sensation is intact.     Motor: Motor function is intact. No weakness.     Coordination: Coordination is intact.     Gait: Gait normal.     Deep Tendon Reflexes: Reflexes abnormal.     Reflex Scores:      Tricep reflexes are 0 on the right side and 0 on the left side.      Bicep reflexes are 0 on the right side and 0 on the left side.      Brachioradialis reflexes are 0 on the right side and 0 on the left side.      Patellar reflexes are 0 on the right side and 0 on the left side.      Achilles reflexes are 0 on the right side and 1+ on the left side. Psychiatric:        Mood and Affect: Mood normal.        Behavior: Behavior normal.     Lab Results  Component Value Date   WBC 7.7 12/19/2021   HGB 14.8 12/19/2021   HCT 42.8 12/19/2021   PLT 232 12/19/2021   GLUCOSE 80 12/19/2021   CHOL 138 12/19/2021   TRIG 188 (H) 12/19/2021   HDL 41 12/19/2021   LDLCALC 66 12/19/2021   ALT 19 12/19/2021   AST 28 12/19/2021   NA 141 12/19/2021   K 5.0 12/19/2021   CL 103 12/19/2021   CREATININE 1.30 (H) 12/19/2021   BUN 24 12/19/2021   CO2 26 12/19/2021   TSH 1.120 12/19/2021   PSA 0.3 12/19/2021   HGBA1C 5.8 (H) 12/19/2021    VAS [place] ABI WITH/WO TBI  Result Date: 12/24/2021  LOWER EXTREMITY DOPPLER STUDY Patient Name:  Mark [name]  Date of Exam:   12/24/2021 Medical Rec #: [id number]      Accession #:    [id number] Date of Birth: [birth year]      Patient Gender: M Patient Age:   25 years Exam Location:  [facility] Procedure:      VAS [place] ABI WITH/WO TBI Referring Phys: Scarlette Calico --------------------------------------------------------------------------------  Indications: Poorly palpable pulses. Left calf cramp when going up stairs.  Performing Technologist: Ralene Cork RVT  Examination Guidelines: A complete evaluation  includes at minimum, Doppler waveform signals and systolic blood pressure reading at the level of bilateral brachial, anterior tibial, and posterior tibial arteries, when vessel segments are accessible. Bilateral testing is considered an integral part of a complete examination. Photoelectric Plethysmograph (PPG) waveforms and toe systolic pressure readings  are included as required and additional duplex testing as needed. Limited examinations for reoccurring indications may be performed as noted.  ABI Findings: +---------+------------------+-----+---------+--------+ Right    Rt Pressure (mmHg)IndexWaveform Comment  +---------+------------------+-----+---------+--------+ Brachial 116                                      +---------+------------------+-----+---------+--------+ PTA      139               1.19 triphasic         +---------+------------------+-----+---------+--------+ DP       141               1.21 triphasic         +---------+------------------+-----+---------+--------+ Great Toe90                0.77                   +---------+------------------+-----+---------+--------+ +---------+------------------+-----+---------+-------+ Left     Lt Pressure (mmHg)IndexWaveform Comment +---------+------------------+-----+---------+-------+ Brachial 117                                     +---------+------------------+-----+---------+-------+ PTA      132               1.13 triphasic        +---------+------------------+-----+---------+-------+ DP       127               1.09 triphasic        +---------+------------------+-----+---------+-------+ Great Toe96                0.82                  +---------+------------------+-----+---------+-------+ +-------+-----------+-----------+------------+------------+ ABI/TBIToday's ABIToday's TBIPrevious ABIPrevious TBI +-------+-----------+-----------+------------+------------+ Right  1.21       0.77                                 +-------+-----------+-----------+------------+------------+ Left   1.13       0.82                                +-------+-----------+-----------+------------+------------+  No previous ABI.  Summary: Right: Resting right ankle-brachial index is within normal range. No evidence of significant right lower extremity arterial disease. The right toe-brachial index is normal. Left: Resting left ankle-brachial index is within normal range. No evidence of significant left lower extremity arterial disease. The left toe-brachial index is normal.  *See table(s) above for measurements and observations.  Electronically signed by Harold Barban MD on 12/24/2021 at 6:55:32 PM.    Final     Assessment & Plan:   Mark [name] was seen today for back pain.  Diagnoses and all orders for this visit:  Essential hypertension, benign- His BP is well controlled.  SVT (supraventricular tachycardia) ([place])- No palpitations. Will cont the BB.  Urticaria due to cold -     EPINEPHrine 0.3 mg/0.3 mL IJ SOAJ injection; Inject 0.3 mg into the muscle as needed (ANAPHYLAXIS).  Right lumbar radiculitis- His neuro exam is abnormal.  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  Will see if he is developed one in his lumbar spine  or to see if there is any other lesion that would be amenable to intervention. -     MR Lumbar Spine Wo Contrast; Future  Skin lesion of face -     Ambulatory referral to Dermatology  Other orders -     Zoster Recombinant (Shingrix )   I have changed Mark [name]'s EPINEPHrine. I am also having him maintain his Glucosamine-Chondroit-Vit C-Mn (GLUCOSAMINE 1500 COMPLEX PO), rizatriptan, Vitamin D (Ergocalciferol), triamcinolone, Vitamin D3, diclofenac Sodium, allopurinol, pantoprazole, rosuvastatin, and metoprolol succinate.  Meds ordered this encounter  Medications   EPINEPHrine 0.3 mg/0.3 mL IJ SOAJ injection    Sig: Inject 0.3 mg into the muscle as needed  (ANAPHYLAXIS).    Dispense:  2 each    Refill:  2     Follow-up: No follow-ups on file.  Scarlette Calico, MD

## 2022-05-21 DIAGNOSIS — L989 Disorder of the skin and subcutaneous tissue, unspecified: Secondary | ICD-10-CM | POA: Insufficient documentation

## 2022-05-21 DIAGNOSIS — M5416 Radiculopathy, lumbar region: Secondary | ICD-10-CM | POA: Insufficient documentation

## 2022-05-27 ENCOUNTER — Encounter: Payer: Self-pay | Admitting: Internal Medicine

## 2022-05-28 ENCOUNTER — Ambulatory Visit: Payer: 59 | Admitting: Internal Medicine

## 2022-06-06 ENCOUNTER — Ambulatory Visit
Admission: RE | Admit: 2022-06-06 | Discharge: 2022-06-06 | Disposition: A | Discharge status: Home / Self Care | Payer: 59 | Source: Ambulatory Visit | Attending: Internal Medicine | Admitting: Internal Medicine

## 2022-06-06 DIAGNOSIS — M5416 Radiculopathy, lumbar region: Secondary | ICD-10-CM

## 2022-06-08 ENCOUNTER — Encounter: Payer: Self-pay | Admitting: Internal Medicine

## 2022-06-11 ENCOUNTER — Other Ambulatory Visit: Payer: Self-pay | Admitting: Internal Medicine

## 2022-06-11 DIAGNOSIS — M48061 Spinal stenosis, lumbar region without neurogenic claudication: Secondary | ICD-10-CM

## 2022-07-25 ENCOUNTER — Ambulatory Visit (INDEPENDENT_AMBULATORY_CARE_PROVIDER_SITE_OTHER): Payer: 59 | Admitting: *Deleted

## 2022-07-25 DIAGNOSIS — Z23 Encounter for immunization: Secondary | ICD-10-CM | POA: Diagnosis not present

## 2022-07-25 NOTE — Progress Notes (Signed)
Pls cosign for [facility] inj../lmb  

## 2022-07-30 IMAGING — DX DG ELBOW COMPLETE 3+V*R*
4 series · 4 of 4 positions shown · non-contrast
Comparison: None.

CLINICAL DATA: Right elbow pain, limited range of motion, history
of arthritis

EXAM:
LEFT ELBOW - COMPLETE 3+ VIEW; RIGHT ELBOW - COMPLETE 3+ VIEW

[elbow ap]
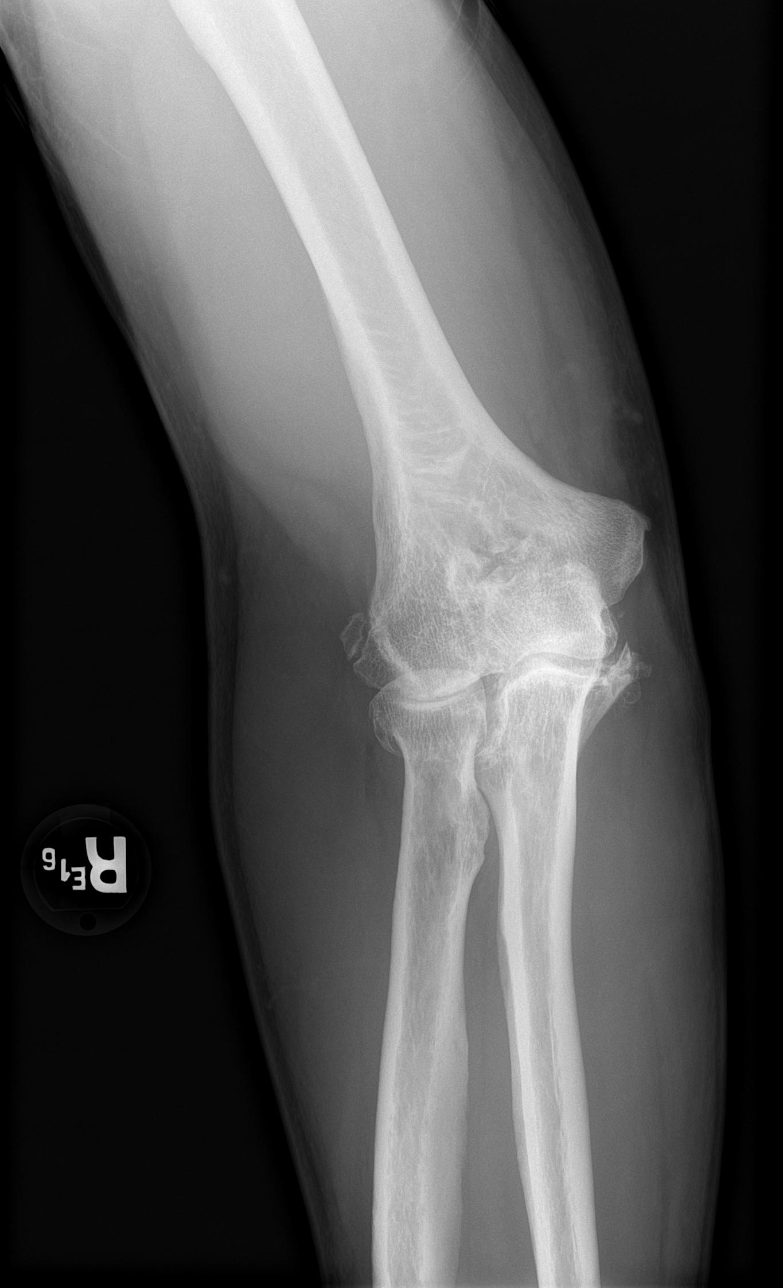

[elbow obl (1 of 2)]
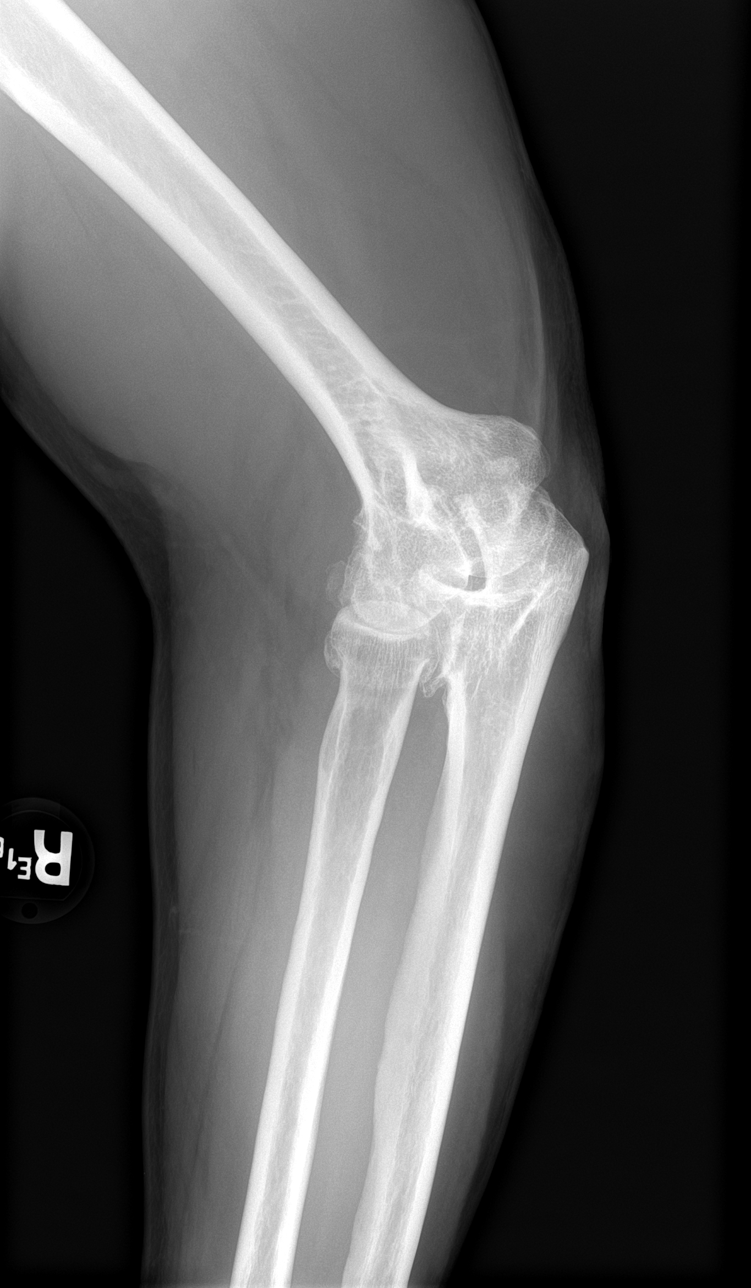

[elbow obl (2 of 2)]
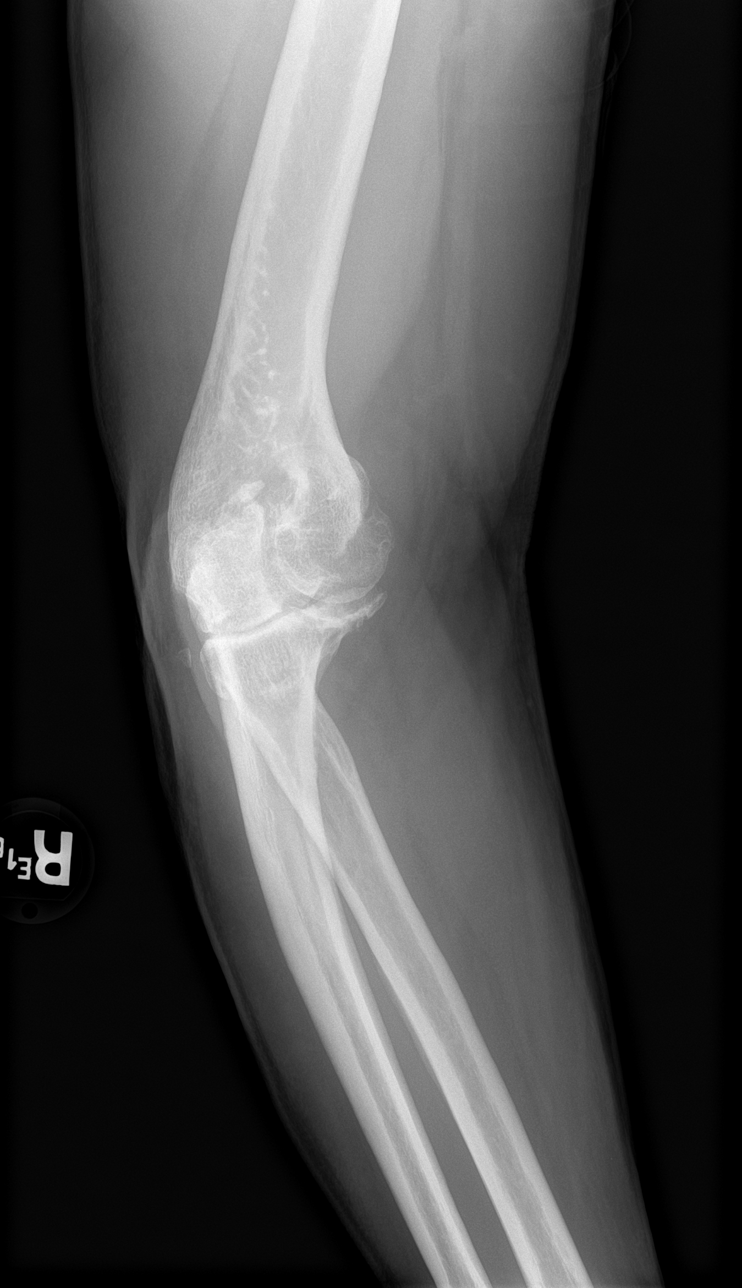

[elbow lat]
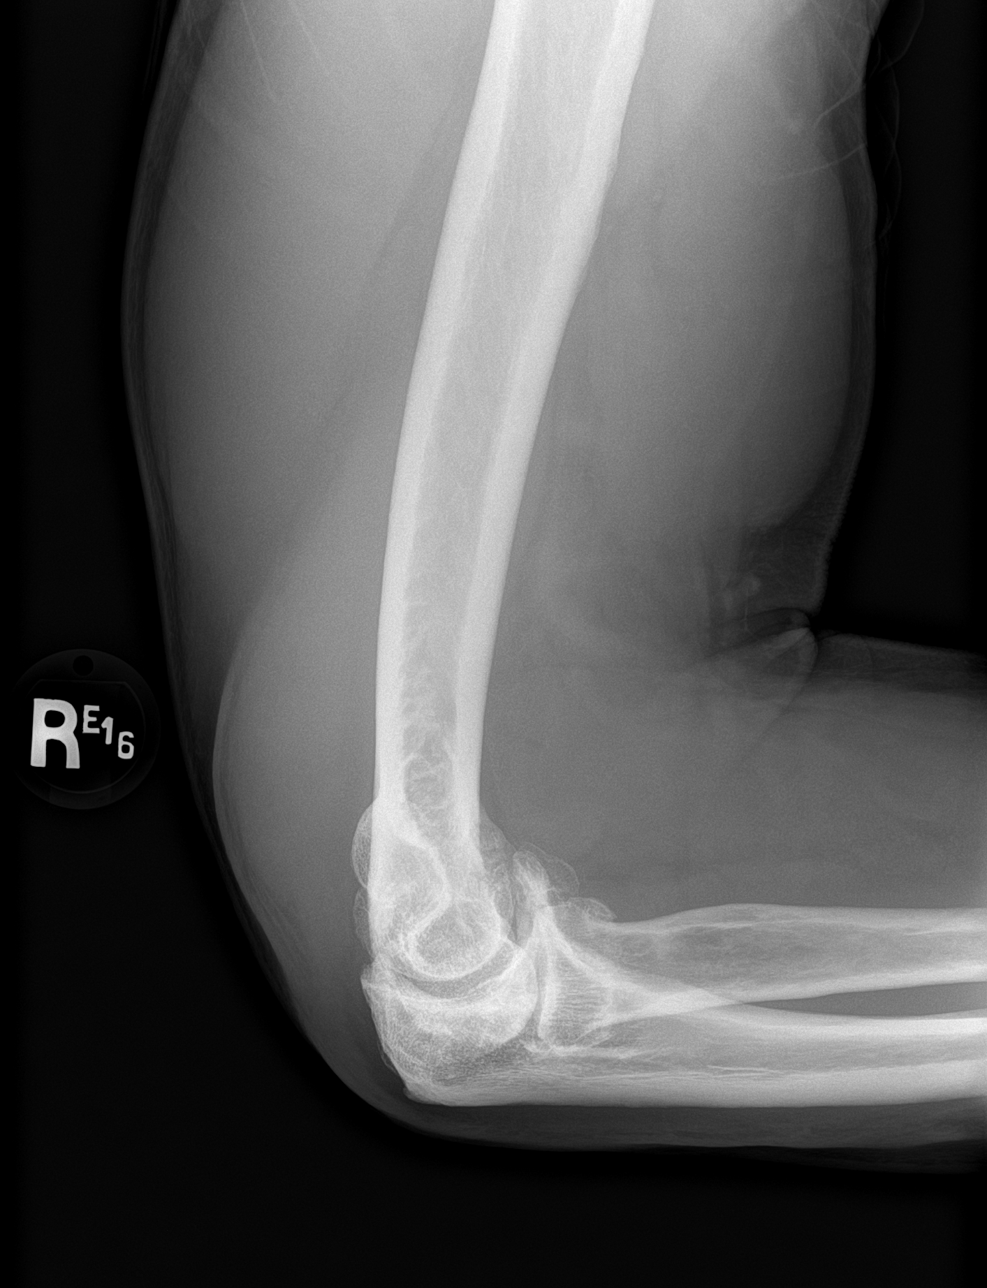

[4 of 4 positions shown; findings below may reference images not displayed]

FINDINGS: No fracture or dislocation of the bilateral elbows. There is severe
right elbow joint arthrosis with large osteophytes and joint space
narrowing. There is mild left elbow joint arthrosis. No elbow joint
effusion. Soft tissue edema over the olecranon bilaterally.
IMPRESSION: 1. No fracture or dislocation of the bilateral elbows.
2. There is severe right and mild left elbow joint arthrosis.
3. Soft tissue edema over the olecranon bilaterally.

## 2022-07-30 IMAGING — DX DG ELBOW COMPLETE 3+V*L*
4 series · 4 of 4 positions shown · non-contrast
Comparison: None.

CLINICAL DATA: Right elbow pain, limited range of motion, history
of arthritis

EXAM:
LEFT ELBOW - COMPLETE 3+ VIEW; RIGHT ELBOW - COMPLETE 3+ VIEW

[elbow ap]
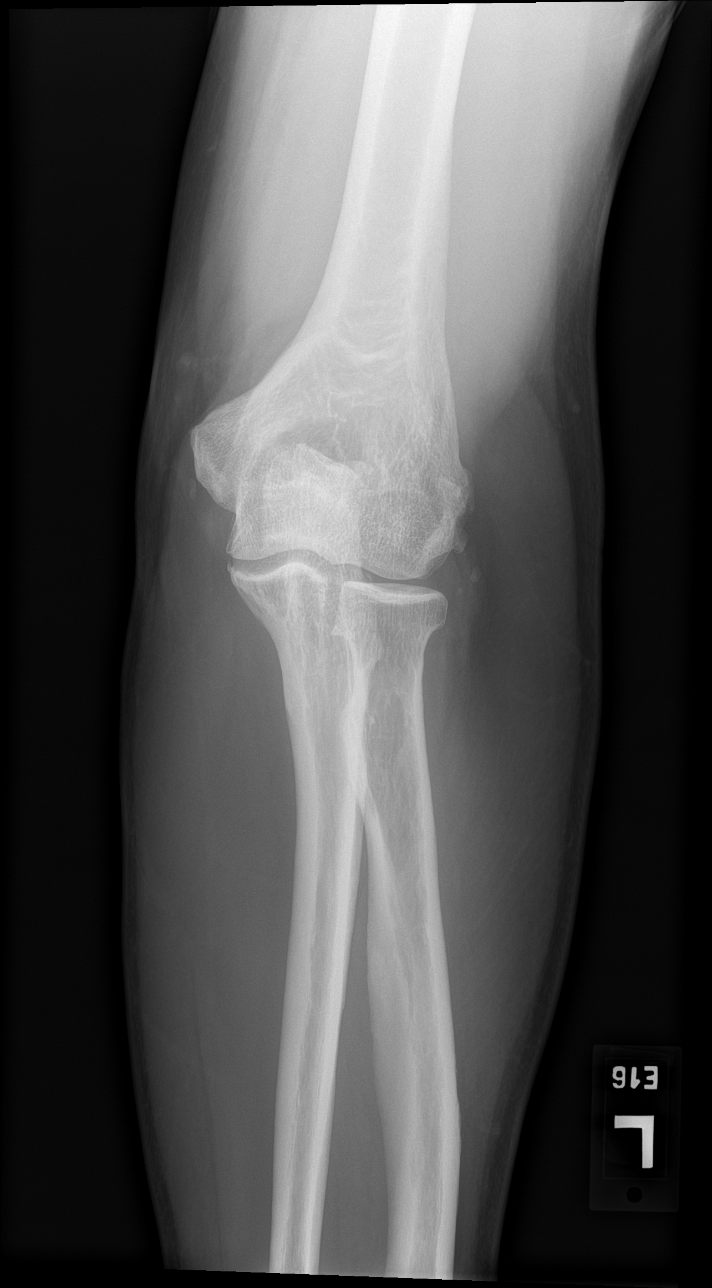

[elbow obl (1 of 2)]
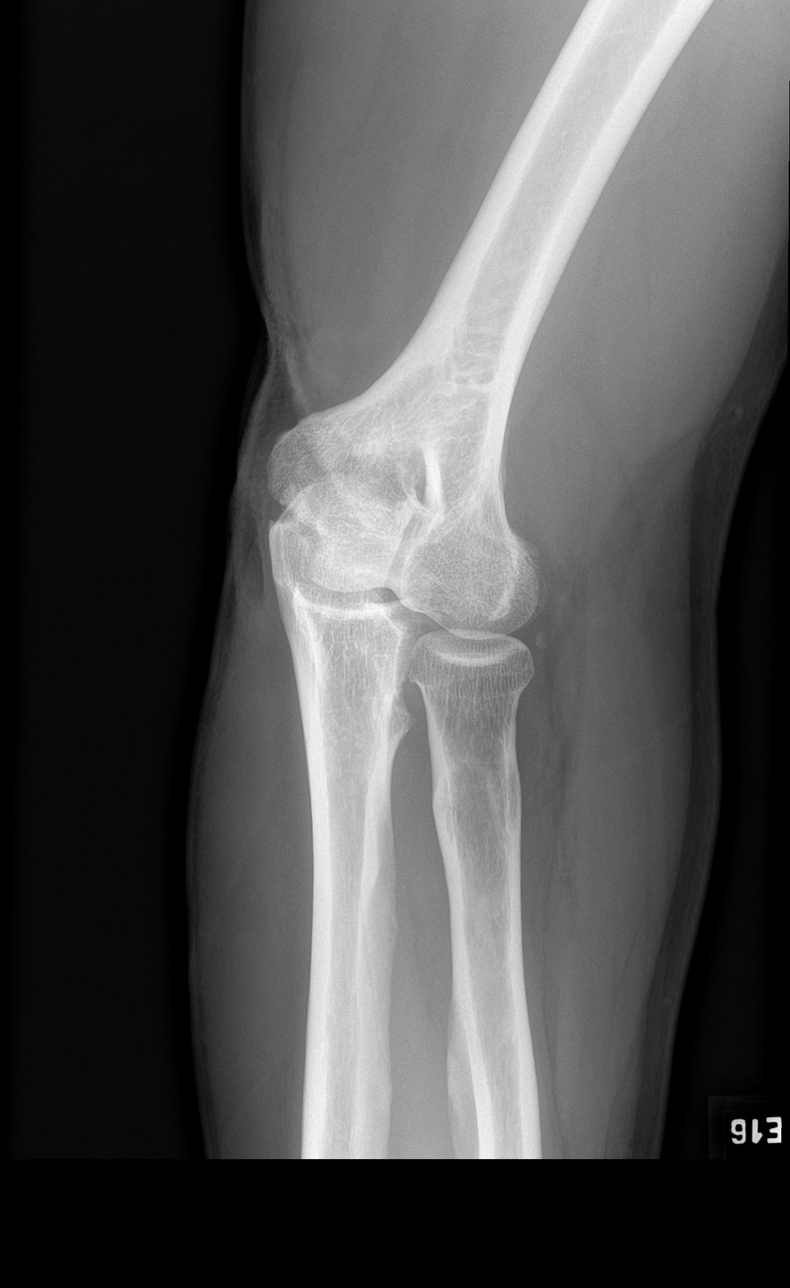

[elbow obl (2 of 2)]
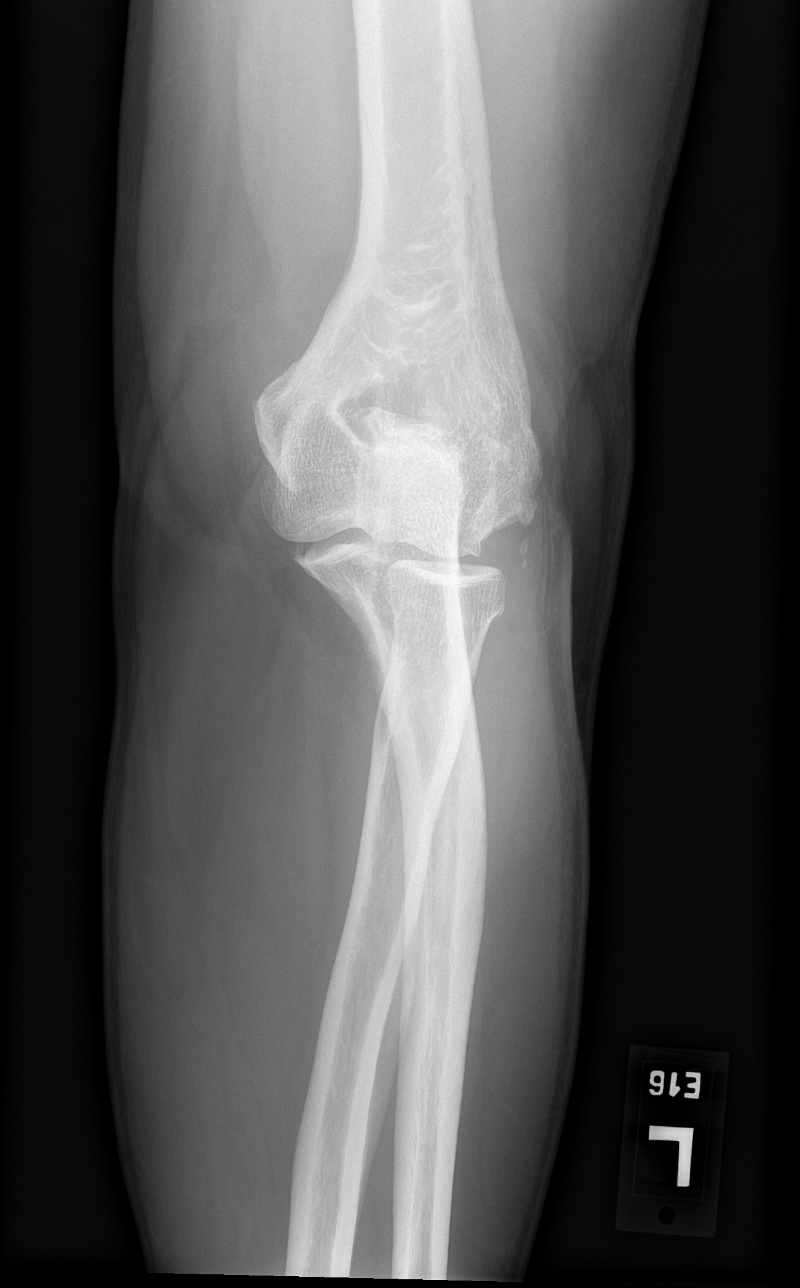

[elbow lat]
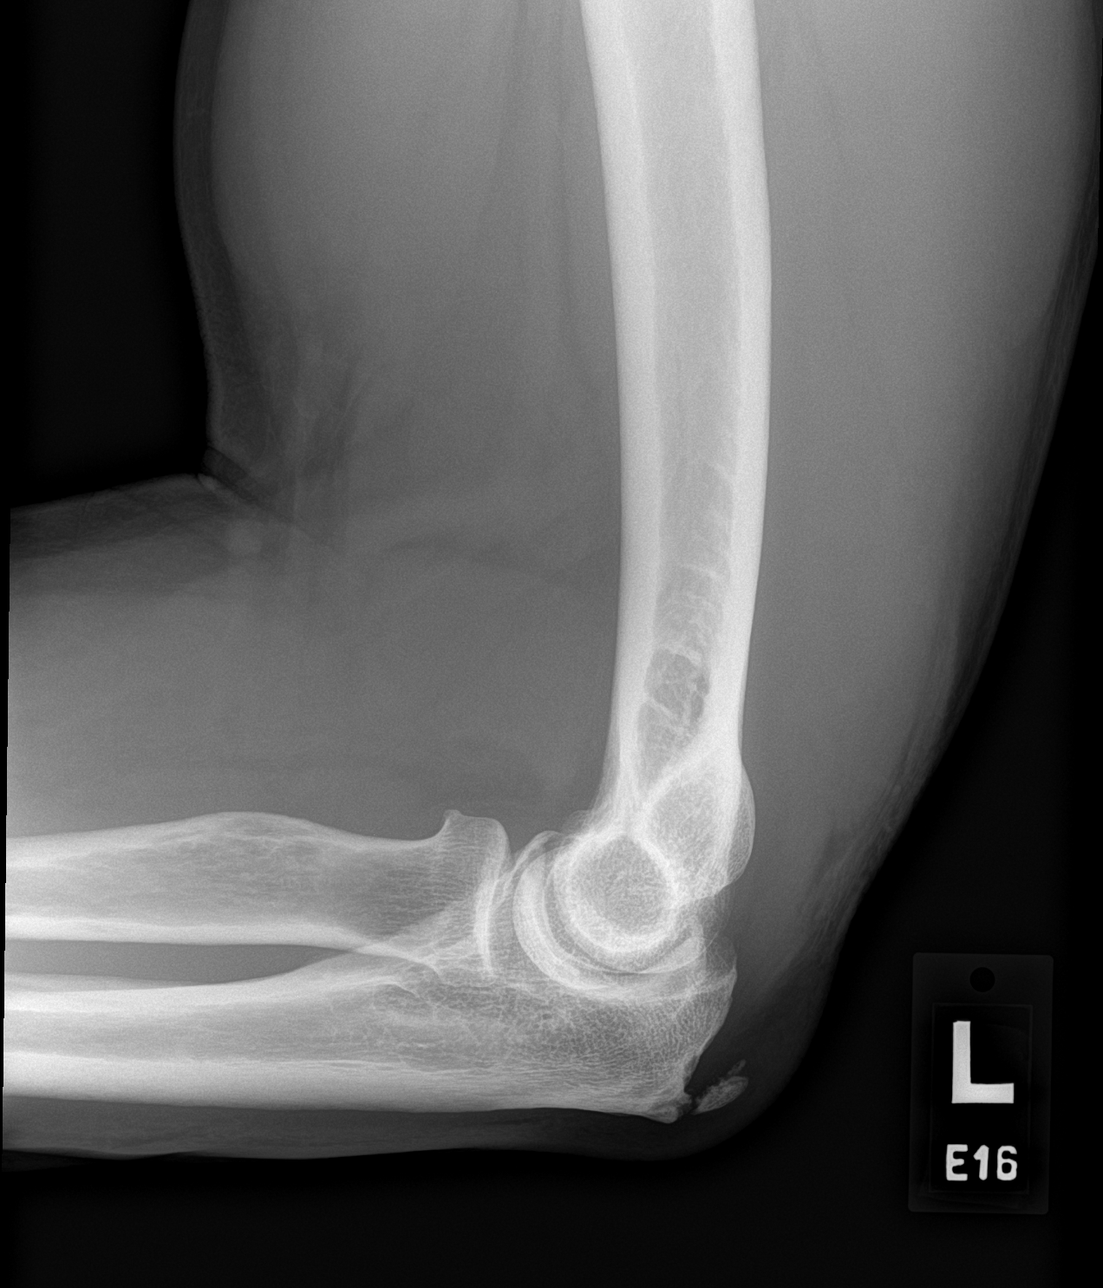

[4 of 4 positions shown; findings below may reference images not displayed]

FINDINGS: No fracture or dislocation of the bilateral elbows. There is severe
right elbow joint arthrosis with large osteophytes and joint space
narrowing. There is mild left elbow joint arthrosis. No elbow joint
effusion. Soft tissue edema over the olecranon bilaterally.
IMPRESSION: 1. No fracture or dislocation of the bilateral elbows.
2. There is severe right and mild left elbow joint arthrosis.
3. Soft tissue edema over the olecranon bilaterally.

## 2022-08-02 ENCOUNTER — Other Ambulatory Visit: Payer: Self-pay | Admitting: Internal Medicine

## 2022-08-02 DIAGNOSIS — M1000 Idiopathic gout, unspecified site: Secondary | ICD-10-CM

## 2022-08-02 DIAGNOSIS — K219 Gastro-esophageal reflux disease without esophagitis: Secondary | ICD-10-CM

## 2022-09-04 ENCOUNTER — Other Ambulatory Visit: Payer: Self-pay | Admitting: Internal Medicine

## 2022-09-04 DIAGNOSIS — I10 Essential (primary) hypertension: Secondary | ICD-10-CM

## 2022-09-04 DIAGNOSIS — I471 Supraventricular tachycardia, unspecified: Secondary | ICD-10-CM

## 2022-09-25 ENCOUNTER — Ambulatory Visit (INDEPENDENT_AMBULATORY_CARE_PROVIDER_SITE_OTHER): Payer: 59

## 2022-09-25 DIAGNOSIS — Z23 Encounter for immunization: Secondary | ICD-10-CM

## 2022-09-25 NOTE — Progress Notes (Signed)
Pt given Reg flu vaccine w/o any complications.

## 2022-10-29 ENCOUNTER — Encounter: Payer: Self-pay | Admitting: Internal Medicine

## 2022-10-29 ENCOUNTER — Ambulatory Visit: Payer: 59 | Admitting: Internal Medicine

## 2022-10-29 DIAGNOSIS — M1000 Idiopathic gout, unspecified site: Secondary | ICD-10-CM | POA: Diagnosis not present

## 2022-10-29 DIAGNOSIS — I471 Supraventricular tachycardia, unspecified: Secondary | ICD-10-CM | POA: Diagnosis not present

## 2022-10-29 DIAGNOSIS — I10 Essential (primary) hypertension: Secondary | ICD-10-CM

## 2022-10-29 DIAGNOSIS — E785 Hyperlipidemia, unspecified: Secondary | ICD-10-CM

## 2022-10-29 DIAGNOSIS — K219 Gastro-esophageal reflux disease without esophagitis: Secondary | ICD-10-CM | POA: Diagnosis not present

## 2022-10-29 MED ORDER — ALLOPURINOL 300 MG PO TABS: 300.0000 mg | ORAL_TABLET | Freq: Every day | ORAL | 1 refills | Status: DC

## 2022-10-29 MED ORDER — PANTOPRAZOLE SODIUM 40 MG PO TBEC
40.0000 mg | DELAYED_RELEASE_TABLET | Freq: Every day | ORAL | 1 refills | Status: DC
Start: 2022-10-29 — End: 2022-12-31

## 2022-10-29 MED ORDER — METOPROLOL SUCCINATE ER 50 MG PO TB24: 50.0000 mg | ORAL_TABLET | Freq: Every day | ORAL | 1 refills | Status: DC

## 2022-10-29 MED ORDER — ROSUVASTATIN CALCIUM 10 MG PO TABS
10.0000 mg | ORAL_TABLET | Freq: Every day | ORAL | 1 refills | Status: DC
Start: 2022-10-29 — End: 2022-11-02

## 2022-10-29 NOTE — Progress Notes (Signed)
Subjective:  Patient ID: Mark [name], male    DOB: 1961/04/29  Age: 62 y.o. MRN: [id number]  CC: Hypertension   [name] presents for f/up - He is very active and denies chest pain, shortness of breath, diaphoresis, or edema.  Outpatient Medications Prior to Visit  Medication Sig Dispense Refill   Cholecalciferol (VITAMIN D3) 10 MCG (400 UNIT) tablet Take by mouth.     diclofenac Sodium (VOLTAREN) 1 % GEL Apply 4 g topically 4 (four) times daily. 350 g 1   EPINEPHrine 0.3 mg/0.3 mL IJ SOAJ injection Inject 0.3 mg into the muscle as needed (ANAPHYLAXIS). 2 each 2   Glucosamine-Chondroit-Vit C-Mn (GLUCOSAMINE 1500 COMPLEX PO) Take 1 tablet by mouth daily.      rizatriptan (MAXALT) 10 MG tablet Take 1 tablet (10 mg total) by mouth as needed for migraine. May repeat in 2 hours if needed 10 tablet 5   triamcinolone (NASACORT) 55 MCG/ACT AERO nasal inhaler Place 2 sprays into the nose daily. 32.4 mL 5   Vitamin D, Ergocalciferol, (DRISDOL) 50000 units CAPS capsule Take 1 capsule (50,000 Units total) by mouth every 7 (seven) days. 12 capsule 0   allopurinol (ZYLOPRIM) 300 MG tablet TAKE 1 TABLET BY MOUTH DAILY 90 tablet 1   metoprolol succinate (TOPROL-XL) 50 MG 24 hr tablet TAKE 1 TABLET BY MOUTH DAILY  WITH OR IMMEDIATELY FOLLOWING A  MEAL 90 tablet 0   pantoprazole (PROTONIX) 40 MG tablet TAKE 1 TABLET BY MOUTH DAILY 90 tablet 1   rosuvastatin (CRESTOR) 10 MG tablet Take 1 tablet (10 mg total) by mouth daily. 90 tablet 1   No facility-administered medications prior to visit.    ROS Review of Systems  Constitutional: Negative.  Negative for chills, diaphoresis, fatigue and fever.  HENT: Negative.    Eyes: Negative.   Respiratory:  Negative for cough, chest tightness, shortness of breath and wheezing.   Cardiovascular:  Negative for chest pain, palpitations and leg swelling.  Gastrointestinal:  Negative for abdominal pain, constipation, diarrhea, nausea and vomiting.   Endocrine: Negative.   Genitourinary: Negative.  Negative for difficulty urinating.  Musculoskeletal:  Positive for arthralgias. Negative for back pain, myalgias and neck pain.  Skin: Negative.   Neurological:  Negative for dizziness, weakness and light-headedness.  Hematological:  Negative for adenopathy. Does not bruise/bleed easily.  Psychiatric/Behavioral: Negative.      Objective:  BP 132/80 (BP Location: Left Arm, Patient Position: Sitting, Cuff Size: Large)   Pulse 74   Temp 97.6 F (36.4 C) (Oral)   Resp 16   Ht '5\' 8"'$  (1.727 m)   Wt 198 lb (89.8 kg)   SpO2 97%   BMI 30.11 kg/m   BP Readings from Last 3 Encounters:  11/09/22 132/80  05/20/22 118/76  12/19/21 126/78    Wt Readings from Last 3 Encounters:  10/29/22 198 lb (89.8 kg)  05/20/22 206 lb (93.4 kg)  12/19/21 223 lb (101.2 kg)    Physical Exam Vitals reviewed.  HENT:     Nose: Nose normal.     Mouth/Throat:     Mouth: Mucous membranes are moist.  Eyes:     General: No scleral icterus.    Conjunctiva/sclera: Conjunctivae normal.  Cardiovascular:     Rate and Rhythm: Normal rate and regular rhythm.     Heart sounds: No murmur heard. Pulmonary:     Effort: Pulmonary effort is normal.     Breath sounds: No stridor. No wheezing, rhonchi or rales.  Abdominal:  General: Abdomen is flat.     Palpations: There is no mass.     Tenderness: There is no abdominal tenderness. There is no guarding.     Hernia: No hernia is present.  Musculoskeletal:        General: Normal range of motion.     Cervical back: Neck supple.     Right lower leg: No edema.     Left lower leg: No edema.  Lymphadenopathy:     Cervical: No cervical adenopathy.  Skin:    General: Skin is warm and dry.  Neurological:     General: No focal deficit present.     Mental Status: He is alert.  Psychiatric:        Mood and Affect: Mood normal.        Behavior: Behavior normal.     Lab Results  Component Value Date   WBC 7.7  12/19/2021   HGB 14.8 12/19/2021   HCT 42.8 12/19/2021   PLT 232 12/19/2021   GLUCOSE 80 12/19/2021   CHOL 138 12/19/2021   TRIG 188 (H) 12/19/2021   HDL 41 12/19/2021   LDLCALC 66 12/19/2021   ALT 19 12/19/2021   AST 28 12/19/2021   NA 141 12/19/2021   K 5.0 12/19/2021   CL 103 12/19/2021   CREATININE 1.30 (H) 12/19/2021   BUN 24 12/19/2021   CO2 26 12/19/2021   TSH 1.120 12/19/2021   PSA 0.3 12/19/2021   HGBA1C 5.8 (H) 12/19/2021    MR Lumbar Spine Wo Contrast  Result Date: 06/07/2022 CLINICAL DATA:  62 year old male with low back pain, bilateral leg pain for 6 months. History of facet injections. EXAM: MRI LUMBAR SPINE WITHOUT CONTRAST TECHNIQUE: Multiplanar, multisequence MR imaging of the lumbar spine was performed. No intravenous contrast was administered. COMPARISON:  Lumbar MRI 04/05/2019. CT Abdomen and Pelvis 03/02/2019. FINDINGS: Segmentation: Normal on the 2020 CT which is the same numbering system used on the previous MRI. Alignment: Chronic levoconvex lumbar scoliosis is mild-to-moderate. Chronic L4-L5 grade 1 anterolisthesis is more apparent since 2020, measuring up to 5 mm now. Otherwise stable lumbar lordosis. Vertebrae: Faint degenerative posterior element marrow edema at L4 and L5. Superimposed right side facet joint fluid. See additional details below. Background bone marrow signal within normal limits. No other marrow edema or acute osseous abnormality. Intact visible sacrum. Conus medullaris and cauda equina: Conus extends to the L2 level, chronically low lying but otherwise appears normally formed. No lower spinal cord or conus signal abnormality. Paraspinal and other soft tissues: Negative. Disc levels: Lower thoracic disc desiccation and disc bulging with posterior element hypertrophy is visible at T10-T11 with up to mild spinal stenosis there (series 3, image 8). Moderate to severe left T10 foraminal stenosis is associated. T11-T12: Mild facet hypertrophy.  No  stenosis. T12-L1: Left far lateral disc osteophyte complex with mild facet hypertrophy. No significant stenosis. L1-L2: Chronic disc space loss and circumferential disc osteophyte complex eccentric to the left. Mild facet hypertrophy. Mild spinal stenosis here just above the conus appears progressed since 2020. No definite cord/conus mass effect or signal abnormality. Mild left L1 foraminal stenosis appears increased. L2-L3: Mild far lateral disc bulging. Mild facet hypertrophy. No significant stenosis. L3-L4: Chronic right foraminal and lateral disc bulging. Mild facet and ligament flavum hypertrophy. Mild epidural lipomatosis. Mild right L3 foraminal stenosis appears stable. L4-L5: Grade 1 anterolisthesis appears increased along with moderate to severe facet and ligament flavum hypertrophy. New degenerative facet joint fluid mostly on the left. Chronic  disc bulging/pseudo disc with increased left subarticular small disc protrusion series 6, image 31. Increased moderate to severe spinal and left lateral recess stenosis (left L5 nerve level). Moderate left foraminal stenosis appears stable. But moderate right L4 foraminal stenosis appears progressed and in part related to increased right foraminal disc on series 3, image 5. L5-S1: Negative disc. Moderate facet hypertrophy. Mild left L5 foraminal stenosis appears stable. IMPRESSION: 1. Chronic levoconvex lumbar scoliosis with grade 1 anterolisthesis at L4-L5 that appears increased since 2020. Subsequent disc and posterior element degeneration have progressed with increased moderate to severe spinal and left lateral recess stenosis, moderate right foraminal stenosis. Query L4 and/or L5 radiculitis. 2. Furthermore, mild multifactorial spinal stenosis at L1-L2 also appears progressed since 2020. And mild lower thoracic spinal stenosis is suspected at T10-T11 Chronically low lying conus medullaris at L2. Up to mild lower spinal cord mass effect but no lower thoracic  spinal cord or conus signal abnormality. 3. Moderate to severe left T10 foraminal stenosis. Electronically Signed   By: Genevie Ann M.D.   On: 06/07/2022 08:00    Assessment & Plan:   Shiraz was seen today for hypertension.  Diagnoses and all orders for this visit:  Essential hypertension, benign- His blood pressure is adequately well-controlled. -     metoprolol succinate (TOPROL-XL) 50 MG 24 hr tablet; Take 1 tablet (50 mg total) by mouth daily. Take with or immediately following a meal. -     Cancel: CBC with Differential/Platelet; Future -     Cancel: Basic metabolic panel; Future -     CBC with Differential/Platelet; Future -     Basic metabolic panel; Future  SVT (supraventricular tachycardia)- He has good rate control.  Will continue the beta-blocker. -     metoprolol succinate (TOPROL-XL) 50 MG 24 hr tablet; Take 1 tablet (50 mg total) by mouth daily. Take with or immediately following a meal.  Gastroesophageal reflux disease without esophagitis -     pantoprazole (PROTONIX) 40 MG tablet; Take 1 tablet (40 mg total) by mouth daily. -     Cancel: CBC with Differential/Platelet; Future -     CBC with Differential/Platelet; Future  Idiopathic gout, unspecified chronicity, unspecified site- He has had no episodes of gout.  Will monitor his uric acid level. -     allopurinol (ZYLOPRIM) 300 MG tablet; Take 1 tablet (300 mg total) by mouth daily. -     Cancel: Basic metabolic panel; Future -     Cancel: Uric acid; Future -     Uric acid; Future -     Basic metabolic panel; Future  Hyperlipidemia with target LDL less than 100- LDL goal achieved. Doing well on the statin  -     Discontinue: rosuvastatin (CRESTOR) 10 MG tablet; Take 1 tablet (10 mg total) by mouth daily. -     Hepatic function panel; Future   I have discontinued Gagan Zeiter's rosuvastatin. I have also changed his pantoprazole, metoprolol succinate, and allopurinol. Additionally, I am having him maintain his  Glucosamine-Chondroit-Vit C-Mn (GLUCOSAMINE 1500 COMPLEX PO), rizatriptan, Vitamin D (Ergocalciferol), triamcinolone, Vitamin D3, diclofenac Sodium, and EPINEPHrine.  Meds ordered this encounter  Medications   DISCONTD: rosuvastatin (CRESTOR) 10 MG tablet    Sig: Take 1 tablet (10 mg total) by mouth daily.    Dispense:  90 tablet    Refill:  1   pantoprazole (PROTONIX) 40 MG tablet    Sig: Take 1 tablet (40 mg total) by mouth daily.  Dispense:  90 tablet    Refill:  1   metoprolol succinate (TOPROL-XL) 50 MG 24 hr tablet    Sig: Take 1 tablet (50 mg total) by mouth daily. Take with or immediately following a meal.    Dispense:  90 tablet    Refill:  1   allopurinol (ZYLOPRIM) 300 MG tablet    Sig: Take 1 tablet (300 mg total) by mouth daily.    Dispense:  90 tablet    Refill:  1     Follow-up: Return in about 6 months (around 04/29/2023).  Scarlette Calico, MD

## 2022-10-29 NOTE — Patient Instructions (Signed)
Gout  Gout is a condition that causes painful swelling of the joints. Gout is a type of inflammation of the joints (arthritis). This condition is caused by having too much uric acid in the body. Uric acid is a chemical that forms when the body breaks down substances called purines. Purines are important for building body proteins. When the body has too much uric acid, sharp crystals can form and build up inside the joints. This causes pain and swelling. Gout attacks can happen quickly and may be very painful (acute gout). Over time, the attacks can affect more joints and become more frequent (chronic gout). Gout can also cause uric acid to build up under the skin and inside the kidneys. What are the causes? This condition is caused by too much uric acid in your blood. This can happen because: Your kidneys do not remove enough uric acid from your blood. This is the most common cause. Your body makes too much uric acid. This can happen with some cancers and cancer treatments. It can also occur if your body is breaking down too many red blood cells (hemolytic anemia). You eat too many foods that are high in purines. These foods include organ meats and some seafood. Alcohol, especially beer, is also high in purines. A gout attack may be triggered by trauma or stress. What increases the risk? The following factors may make you more likely to develop this condition: Having a family history of gout. Being male and middle-aged. Being male and having gone through menopause. Taking certain medicines, including aspirin, cyclosporine, diuretics, levodopa, and niacin. Having an organ transplant. Having certain conditions, such as: Being obese. Lead poisoning. Kidney disease. A skin condition called psoriasis. Other factors include: Losing weight too quickly. Being dehydrated. Frequently drinking alcohol, especially beer. Frequently drinking beverages that are sweetened with a type of sugar called  fructose. What are the signs or symptoms? An attack of acute gout happens quickly. It usually occurs in just one joint. The most common place is the big toe. Attacks often start at night. Other joints that may be affected include joints of the feet, ankle, knee, fingers, wrist, or elbow. Symptoms of this condition may include: Severe pain. Warmth. Swelling. Stiffness. Tenderness. The affected joint may be very painful to touch. Shiny, red, or purple skin. Chills and fever. Chronic gout may cause symptoms more frequently. More joints may be involved. You may also have white or yellow lumps (tophi) on your hands or feet or in other areas near your joints. How is this diagnosed? This condition is diagnosed based on your symptoms, your medical history, and a physical exam. You may have tests, such as: Blood tests to measure uric acid levels. Removal of joint fluid with a thin needle (aspiration) to look for uric acid crystals. X-rays to look for joint damage. How is this treated? Treatment for this condition has two phases: treating an acute attack and preventing future attacks. Acute gout treatment may include medicines to reduce pain and swelling, including: NSAIDs, such as ibuprofen. Steroids. These are strong anti-inflammatory medicines that can be taken by mouth (orally) or injected into a joint. Colchicine. This medicine relieves pain and swelling when it is taken soon after an attack. It can be given by mouth or through an IV. Preventive treatment may include: Daily use of smaller doses of NSAIDs or colchicine. Use of a medicine that reduces uric acid levels in your blood, such as allopurinol. Changes to your diet. You may need to see   a dietitian about what to eat and drink to prevent gout. Follow these instructions at home: During a gout attack  If directed, put ice on the affected area. To do this: Put ice in a plastic bag. Place a towel between your skin and the bag. Leave the  ice on for 20 minutes, 2-3 times a day. Remove the ice if your skin turns bright red. This is very important. If you cannot feel pain, heat, or cold, you have a greater risk of damage to the area. Raise (elevate) the affected joint above the level of your heart as often as possible. Rest the joint as much as possible. If the affected joint is in your leg, you may be given crutches to use. Follow instructions from your health care provider about eating or drinking restrictions. Avoiding future gout attacks Follow a low-purine diet as told by your dietitian or health care provider. Avoid foods and drinks that are high in purines, including liver, kidney, anchovies, asparagus, herring, mushrooms, mussels, and beer. Maintain a healthy weight or lose weight if you are overweight. If you want to lose weight, talk with your health care provider. Do not lose weight too quickly. Start or maintain an exercise program as told by your health care provider. Eating and drinking Avoid drinking beverages that contain fructose. Drink enough fluids to keep your urine pale yellow. If you drink alcohol: Limit how much you have to: 0-1 drink a day for women who are not pregnant. 0-2 drinks a day for men. Know how much alcohol is in a drink. In the [place] one drink equals one 12 oz bottle of beer (355 mL), one 5 oz glass of wine (148 mL), or one 1 oz glass of hard liquor (44 mL). General instructions Take over-the-counter and prescription medicines only as told by your health care provider. Ask your health care provider if the medicine prescribed to you requires you to avoid driving or using machinery. Return to your normal activities as told by your health care provider. Ask your health care provider what activities are safe for you. Keep all follow-up visits. This is important. Where to find more information National Institutes of Health: www.niams.nih.gov Contact a health care provider if you have: Another  gout attack. Continuing symptoms of a gout attack after 10 days of treatment. Side effects from your medicines. Chills or a fever. Burning pain when you urinate. Pain in your lower back or abdomen. Get help right away if you: Have severe or uncontrolled pain. Cannot urinate. Summary Gout is painful swelling of the joints caused by having too much uric acid in the body. The most common site for gout to occur is in the big toe, but it can affect other joints in the body. Medicines and dietary changes can help to prevent and treat gout attacks. This information is not intended to replace advice given to you by your health care provider. Make sure you discuss any questions you have with your health care provider. Document Revised: 06/27/2021 Document Reviewed: 06/27/2021 Elsevier Patient Education  2023 [employer]vier Inc.  

## 2022-11-02 ENCOUNTER — Other Ambulatory Visit: Payer: Self-pay | Admitting: Internal Medicine

## 2022-11-02 DIAGNOSIS — E785 Hyperlipidemia, unspecified: Secondary | ICD-10-CM

## 2022-11-10 ENCOUNTER — Other Ambulatory Visit: Payer: Self-pay | Admitting: Internal Medicine

## 2022-11-10 DIAGNOSIS — I10 Essential (primary) hypertension: Secondary | ICD-10-CM

## 2022-11-10 DIAGNOSIS — I471 Supraventricular tachycardia, unspecified: Secondary | ICD-10-CM

## 2022-11-14 ENCOUNTER — Encounter: Payer: Self-pay | Admitting: Internal Medicine

## 2022-11-15 ENCOUNTER — Telehealth: Payer: Self-pay | Admitting: Radiology

## 2022-11-15 NOTE — Telephone Encounter (Signed)
Spoke with pt he will come pick up the E-reqs on Monday.

## 2022-12-06 ENCOUNTER — Other Ambulatory Visit: Payer: Self-pay | Admitting: Internal Medicine

## 2022-12-07 LAB — CBC WITH DIFFERENTIAL/PLATELET
Basophils Absolute: 0 10*3/uL (ref 0.0–0.2)
Basos: 1 %
EOS (ABSOLUTE): 0.1 10*3/uL (ref 0.0–0.4)
Eos: 1 %
Hematocrit: 44.3 % (ref 37.5–51.0)
Hemoglobin: 15.1 g/dL (ref 13.0–17.7)
Immature Grans (Abs): 0 10*3/uL (ref 0.0–0.1)
Immature Granulocytes: 0 %
Lymphocytes Absolute: 2.3 10*3/uL (ref 0.7–3.1)
Lymphs: 33 %
MCH: 31.3 pg (ref 26.6–33.0)
MCHC: 34.1 g/dL (ref 31.5–35.7)
MCV: 92 fL (ref 79–97)
Monocytes Absolute: 0.6 10*3/uL (ref 0.1–0.9)
Monocytes: 9 %
Neutrophils Absolute: 4 10*3/uL (ref 1.4–7.0)
Neutrophils: 56 %
Platelets: 228 10*3/uL (ref 150–450)
RBC: 4.83 x10E6/uL (ref 4.14–5.80)
RDW: 12.9 % (ref 11.6–15.4)
WBC: 7.1 10*3/uL (ref 3.4–10.8)

## 2022-12-07 LAB — BASIC METABOLIC PANEL
BUN/Creatinine Ratio: 13 (ref 10–24)
BUN: 15 mg/dL (ref 8–27)
CO2: 23 mmol/L (ref 20–29)
Calcium: 9.2 mg/dL (ref 8.6–10.2)
Chloride: 101 mmol/L (ref 96–106)
Creatinine, Ser: 1.18 mg/dL (ref 0.76–1.27)
Glucose: 105 mg/dL — ABNORMAL HIGH (ref 70–99)
Potassium: 4.3 mmol/L (ref 3.5–5.2)
Sodium: 140 mmol/L (ref 134–144)
eGFR: 70 mL/min/{1.73_m2} (ref >59)

## 2022-12-07 LAB — HEPATIC FUNCTION PANEL
ALT: 20 IU/L (ref 0–44)
AST: 24 IU/L (ref 0–40)
Albumin: 4.6 g/dL (ref 3.9–4.9)
Alkaline Phosphatase: 72 IU/L (ref 44–121)
Bilirubin Total: 0.6 mg/dL (ref 0.0–1.2)
Bilirubin, Direct: 0.18 mg/dL (ref 0.00–0.40)
Total Protein: 6.4 g/dL (ref 6.0–8.5)

## 2022-12-07 LAB — URIC ACID: Uric Acid: 4.1 mg/dL (ref 3.8–8.4)

## 2022-12-17 ENCOUNTER — Encounter: Payer: Self-pay | Admitting: Internal Medicine

## 2022-12-30 ENCOUNTER — Other Ambulatory Visit: Payer: Self-pay | Admitting: Internal Medicine

## 2022-12-30 DIAGNOSIS — K219 Gastro-esophageal reflux disease without esophagitis: Secondary | ICD-10-CM

## 2022-12-31 ENCOUNTER — Other Ambulatory Visit: Payer: Self-pay | Admitting: Internal Medicine

## 2022-12-31 DIAGNOSIS — M5416 Radiculopathy, lumbar region: Secondary | ICD-10-CM

## 2022-12-31 DIAGNOSIS — M1009 Idiopathic gout, multiple sites: Secondary | ICD-10-CM

## 2022-12-31 MED ORDER — CELECOXIB 100 MG PO CAPS: 100.0000 mg | ORAL_CAPSULE | Freq: Two times a day (BID) | ORAL | 0 refills | Status: DC

## 2023-01-07 ENCOUNTER — Ambulatory Visit: Payer: 59 | Admitting: Internal Medicine

## 2023-01-07 ENCOUNTER — Encounter: Payer: Self-pay | Admitting: Internal Medicine

## 2023-01-07 VITALS — BP 122/80 | HR 72 | Temp 98.2°F | Resp 16 | Ht 68.0 in | Wt 210.0 lb

## 2023-01-07 DIAGNOSIS — E785 Hyperlipidemia, unspecified: Secondary | ICD-10-CM

## 2023-01-07 DIAGNOSIS — M5416 Radiculopathy, lumbar region: Secondary | ICD-10-CM

## 2023-01-07 DIAGNOSIS — M48061 Spinal stenosis, lumbar region without neurogenic claudication: Secondary | ICD-10-CM | POA: Diagnosis not present

## 2023-01-07 DIAGNOSIS — R739 Hyperglycemia, unspecified: Secondary | ICD-10-CM | POA: Diagnosis not present

## 2023-01-07 DIAGNOSIS — M19042 Primary osteoarthritis, left hand: Secondary | ICD-10-CM

## 2023-01-07 DIAGNOSIS — Z0000 Encounter for general adult medical examination without abnormal findings: Secondary | ICD-10-CM

## 2023-01-07 MED ORDER — TRAMADOL HCL 50 MG PO TABS: 50.0000 mg | ORAL_TABLET | Freq: Four times a day (QID) | ORAL | 5 refills | Status: AC | PRN

## 2023-01-07 NOTE — Progress Notes (Signed)
Subjective:  Patient ID: Mark [name], male    DOB: 08/23/1961  Age: 62 y.o. MRN: [id number]  CC: Annual Exam and Back Pain   [name] presents for a CPX and f/up ----  He continues to complain of low back pain that intermittently radiates into his lower extremities.  He has gotten some relief with Celebrex.  He tells me he recently had epidural steroid injections which were not helpful.  He is not willing to have surgery at this time.  He would like to take something in addition to Celebrex for the pain.  He is very active and denies chest pain, shortness of breath, diaphoresis, or edema.  Outpatient Medications Prior to Visit  Medication Sig Dispense Refill   allopurinol (ZYLOPRIM) 300 MG tablet Take 1 tablet (300 mg total) by mouth daily. 90 tablet 1   celecoxib (CELEBREX) 100 MG capsule Take 1 capsule (100 mg total) by mouth 2 (two) times daily. 180 capsule 0   Cholecalciferol (VITAMIN D3) 10 MCG (400 UNIT) tablet Take by mouth.     diclofenac Sodium (VOLTAREN) 1 % GEL Apply 4 g topically 4 (four) times daily. 350 g 1   EPINEPHrine 0.3 mg/0.3 mL IJ SOAJ injection Inject 0.3 mg into the muscle as needed (ANAPHYLAXIS). 2 each 2   Glucosamine-Chondroit-Vit C-Mn (GLUCOSAMINE 1500 COMPLEX PO) Take 1 tablet by mouth daily.      metoprolol succinate (TOPROL-XL) 50 MG 24 hr tablet TAKE 1 TABLET BY MOUTH DAILY  WITH OR IMMEDIATELY FOLLOWING A  MEAL 90 tablet 1   pantoprazole (PROTONIX) 40 MG tablet TAKE 1 TABLET BY MOUTH DAILY 90 tablet 1   rizatriptan (MAXALT) 10 MG tablet Take 1 tablet (10 mg total) by mouth as needed for migraine. May repeat in 2 hours if needed 10 tablet 5   rosuvastatin (CRESTOR) 10 MG tablet TAKE 1 TABLET BY MOUTH DAILY 90 tablet 1   triamcinolone (NASACORT) 55 MCG/ACT AERO nasal inhaler Place 2 sprays into the nose daily. 32.4 mL 5   Vitamin D, Ergocalciferol, (DRISDOL) 50000 units CAPS capsule Take 1 capsule (50,000 Units total) by mouth every 7 (seven) days.  12 capsule 0   No facility-administered medications prior to visit.    ROS Review of Systems  Constitutional: Negative.  Negative for chills, diaphoresis, fatigue and fever.  HENT: Negative.    Eyes: Negative.   Respiratory:  Negative for cough, chest tightness, shortness of breath and wheezing.   Cardiovascular:  Negative for chest pain, palpitations and leg swelling.  Gastrointestinal:  Negative for abdominal pain, constipation, diarrhea, nausea and vomiting.  Endocrine: Negative.   Genitourinary: Negative.  Negative for difficulty urinating.  Musculoskeletal:  Positive for arthralgias and back pain. Negative for myalgias.  Skin: Negative.  Negative for color change, pallor and rash.  Neurological:  Negative for dizziness and weakness.  Hematological:  Negative for adenopathy. Does not bruise/bleed easily.  Psychiatric/Behavioral: Negative.      Objective:  BP 122/80 (BP Location: Left Arm, Patient Position: Sitting, Cuff Size: Large)   Pulse 72   Temp 98.2 F (36.8 C) (Oral)   Resp 16   Ht 5\' 8"  (1.727 m)   Wt 210 lb (95.3 kg)   SpO2 95%   BMI 31.93 kg/m   BP Readings from Last 3 Encounters:  01/07/23 122/80  11/09/22 132/80  05/20/22 118/76    Wt Readings from Last 3 Encounters:  01/07/23 210 lb (95.3 kg)  10/29/22 198 lb (89.8 kg)  05/20/22 206  lb (93.4 kg)    Physical Exam Vitals reviewed.  Constitutional:      Appearance: Normal appearance.  HENT:     Mouth/Throat:     Mouth: Mucous membranes are moist.  Eyes:     General: No scleral icterus.    Conjunctiva/sclera: Conjunctivae normal.  Cardiovascular:     Rate and Rhythm: Normal rate and regular rhythm.     Heart sounds: No murmur heard. Pulmonary:     Effort: Pulmonary effort is normal.     Breath sounds: No stridor. No wheezing, rhonchi or rales.  Abdominal:     General: Abdomen is flat.     Palpations: There is no mass.     Tenderness: There is no abdominal tenderness. There is no guarding.      Hernia: No hernia is present. There is no hernia in the left inguinal area or right inguinal area.  Genitourinary:    Pubic Area: No rash.      Penis: Normal and circumcised.      Testes: Normal.     Epididymis:     Right: Normal.     Left: Normal.     Prostate: Normal. Not enlarged, not tender and no nodules present.     Rectum: Normal. Guaiac result negative. No mass, tenderness, anal fissure, external hemorrhoid or internal hemorrhoid. Normal anal tone.  Musculoskeletal:        General: Normal range of motion.     Cervical back: Neck supple.     Right lower leg: No edema.     Left lower leg: No edema.  Lymphadenopathy:     Cervical: No cervical adenopathy.     Lower Body: No right inguinal adenopathy. No left inguinal adenopathy.  Skin:    General: Skin is warm and dry.  Neurological:     General: No focal deficit present.     Mental Status: He is alert.  Psychiatric:        Mood and Affect: Mood normal.        Behavior: Behavior normal.     Lab Results  Component Value Date   WBC 7.1 12/06/2022   HGB 15.1 12/06/2022   HCT 44.3 12/06/2022   PLT 228 12/06/2022   GLUCOSE 105 (H) 12/06/2022   CHOL 138 12/19/2021   TRIG 188 (H) 12/19/2021   HDL 41 12/19/2021   LDLCALC 66 12/19/2021   ALT 20 12/06/2022   AST 24 12/06/2022   NA 140 12/06/2022   K 4.3 12/06/2022   CL 101 12/06/2022   CREATININE 1.18 12/06/2022   BUN 15 12/06/2022   CO2 23 12/06/2022   TSH 1.120 12/19/2021   PSA 0.3 12/19/2021   HGBA1C 5.8 (H) 12/19/2021    MR Lumbar Spine Wo Contrast  Result Date: 06/07/2022 CLINICAL DATA:  62 year old male with low back pain, bilateral leg pain for 6 months. History of facet injections. EXAM: MRI LUMBAR SPINE WITHOUT CONTRAST TECHNIQUE: Multiplanar, multisequence MR imaging of the lumbar spine was performed. No intravenous contrast was administered. COMPARISON:  Lumbar MRI 04/05/2019. CT Abdomen and Pelvis 03/02/2019. FINDINGS: Segmentation: Normal on the 2020  CT which is the same numbering system used on the previous MRI. Alignment: Chronic levoconvex lumbar scoliosis is mild-to-moderate. Chronic L4-L5 grade 1 anterolisthesis is more apparent since 2020, measuring up to 5 mm now. Otherwise stable lumbar lordosis. Vertebrae: Faint degenerative posterior element marrow edema at L4 and L5. Superimposed right side facet joint fluid. See additional details below. Background bone marrow signal within normal  limits. No other marrow edema or acute osseous abnormality. Intact visible sacrum. Conus medullaris and cauda equina: Conus extends to the L2 level, chronically low lying but otherwise appears normally formed. No lower spinal cord or conus signal abnormality. Paraspinal and other soft tissues: Negative. Disc levels: Lower thoracic disc desiccation and disc bulging with posterior element hypertrophy is visible at T10-T11 with up to mild spinal stenosis there (series 3, image 8). Moderate to severe left T10 foraminal stenosis is associated. T11-T12: Mild facet hypertrophy.  No stenosis. T12-L1: Left far lateral disc osteophyte complex with mild facet hypertrophy. No significant stenosis. L1-L2: Chronic disc space loss and circumferential disc osteophyte complex eccentric to the left. Mild facet hypertrophy. Mild spinal stenosis here just above the conus appears progressed since 2020. No definite cord/conus mass effect or signal abnormality. Mild left L1 foraminal stenosis appears increased. L2-L3: Mild far lateral disc bulging. Mild facet hypertrophy. No significant stenosis. L3-L4: Chronic right foraminal and lateral disc bulging. Mild facet and ligament flavum hypertrophy. Mild epidural lipomatosis. Mild right L3 foraminal stenosis appears stable. L4-L5: Grade 1 anterolisthesis appears increased along with moderate to severe facet and ligament flavum hypertrophy. New degenerative facet joint fluid mostly on the left. Chronic disc bulging/pseudo disc with increased left  subarticular small disc protrusion series 6, image 31. Increased moderate to severe spinal and left lateral recess stenosis (left L5 nerve level). Moderate left foraminal stenosis appears stable. But moderate right L4 foraminal stenosis appears progressed and in part related to increased right foraminal disc on series 3, image 5. L5-S1: Negative disc. Moderate facet hypertrophy. Mild left L5 foraminal stenosis appears stable. IMPRESSION: 1. Chronic levoconvex lumbar scoliosis with grade 1 anterolisthesis at L4-L5 that appears increased since 2020. Subsequent disc and posterior element degeneration have progressed with increased moderate to severe spinal and left lateral recess stenosis, moderate right foraminal stenosis. Query L4 and/or L5 radiculitis. 2. Furthermore, mild multifactorial spinal stenosis at L1-L2 also appears progressed since 2020. And mild lower thoracic spinal stenosis is suspected at T10-T11 Chronically low lying conus medullaris at L2. Up to mild lower spinal cord mass effect but no lower thoracic spinal cord or conus signal abnormality. 3. Moderate to severe left T10 foraminal stenosis. Electronically Signed   By: Odessa FlemingH  Hall M.D.   On: 06/07/2022 08:00    Assessment & Plan:   Hyperglycemia- I will monitor his A1c. -     Hemoglobin A1c; Future  Right lumbar radiculitis -     traMADol HCl; Take 1 tablet (50 mg total) by mouth every 6 (six) hours as needed for up to 5 days.  Dispense: 90 tablet; Refill: 5  Spinal stenosis of lumbar region at multiple levels -     traMADol HCl; Take 1 tablet (50 mg total) by mouth every 6 (six) hours as needed for up to 5 days.  Dispense: 90 tablet; Refill: 5  Routine general medical examination at a health care facility- Exam completed, labs reviewed, vaccines reviewed and updated, cancer screenings are up-to-date, patient education was given. -     PSA; Future  Hyperlipidemia with target LDL less than 100- Will check a fasting lipid panel to see if  he has achieved his LDL goal. -     Lipid panel; Future  Osteoarthritis, hand, primary localized, left -     traMADol HCl; Take 1 tablet (50 mg total) by mouth every 6 (six) hours as needed for up to 5 days.  Dispense: 90 tablet; Refill: 5  Follow-up: Return in about 6 months (around 07/09/2023).  Sanda Lingerhomas Jadan Rouillard, MD

## 2023-01-07 NOTE — Patient Instructions (Signed)
Health Maintenance, Male Adopting a healthy lifestyle and getting preventive care are important in promoting health and wellness. Ask your health care provider about: The right schedule for you to have regular tests and exams. Things you can do on your own to prevent diseases and keep yourself healthy. What should I know about diet, weight, and exercise? Eat a healthy diet  Eat a diet that includes plenty of vegetables, fruits, low-fat dairy products, and lean protein. Do not eat a lot of foods that are high in solid fats, added sugars, or sodium. Maintain a healthy weight Body mass index (BMI) is a measurement that can be used to identify possible weight problems. It estimates body fat based on height and weight. Your health care provider can help determine your BMI and help you achieve or maintain a healthy weight. Get regular exercise Get regular exercise. This is one of the most important things you can do for your health. Most adults should: Exercise for at least 150 minutes each week. The exercise should increase your heart rate and make you sweat (moderate-intensity exercise). Do strengthening exercises at least twice a week. This is in addition to the moderate-intensity exercise. Spend less time sitting. Even light physical activity can be beneficial. Watch cholesterol and blood lipids Have your blood tested for lipids and cholesterol at 63 years of age, then have this test every 5 years. You may need to have your cholesterol levels checked more often if: Your lipid or cholesterol levels are high. You are older than 63 years of age. You are at high risk for heart disease. What should I know about cancer screening? Many types of cancers can be detected early and may often be prevented. Depending on your health history and family history, you may need to have cancer screening at various ages. This may include screening for: Colorectal cancer. Prostate cancer. Skin cancer. Lung  cancer. What should I know about heart disease, diabetes, and high blood pressure? Blood pressure and heart disease High blood pressure causes heart disease and increases the risk of stroke. This is more likely to develop in people who have high blood pressure readings or are overweight. Talk with your health care provider about your target blood pressure readings. Have your blood pressure checked: Every 3-5 years if you are 18-39 years of age. Every year if you are 40 years old or older. If you are between the ages of 65 and 75 and are a current or former smoker, ask your health care provider if you should have a one-time screening for abdominal aortic aneurysm (AAA). Diabetes Have regular diabetes screenings. This checks your fasting blood sugar level. Have the screening done: Once every three years after age 45 if you are at a normal weight and have a low risk for diabetes. More often and at a younger age if you are overweight or have a high risk for diabetes. What should I know about preventing infection? Hepatitis B If you have a higher risk for hepatitis B, you should be screened for this virus. Talk with your health care provider to find out if you are at risk for hepatitis B infection. Hepatitis C Blood testing is recommended for: Everyone born from 1945 through 1965. Anyone with known risk factors for hepatitis C. Sexually transmitted infections (STIs) You should be screened each year for STIs, including gonorrhea and chlamydia, if: You are sexually active and are younger than 63 years of age. You are older than 63 years of age and your   health care provider tells you that you are at risk for this type of infection. Your sexual activity has changed since you were last screened, and you are at increased risk for chlamydia or gonorrhea. Ask your health care provider if you are at risk. Ask your health care provider about whether you are at high risk for HIV. Your health care provider  may recommend a prescription medicine to help prevent HIV infection. If you choose to take medicine to prevent HIV, you should first get tested for HIV. You should then be tested every 3 months for as long as you are taking the medicine. Follow these instructions at home: Alcohol use Do not drink alcohol if your health care provider tells you not to drink. If you drink alcohol: Limit how much you have to 0-2 drinks a day. Know how much alcohol is in your drink. In the [place] one drink equals one 12 oz bottle of beer (355 mL), one 5 oz glass of wine (148 mL), or one 1 oz glass of hard liquor (44 mL). Lifestyle Do not use any products that contain nicotine or tobacco. These products include cigarettes, chewing tobacco, and vaping devices, such as e-cigarettes. If you need help quitting, ask your health care provider. Do not use street drugs. Do not share needles. Ask your health care provider for help if you need support or information about quitting drugs. General instructions Schedule regular health, dental, and eye exams. Stay current with your vaccines. Tell your health care provider if: You often feel depressed. You have ever been abused or do not feel safe at home. Summary Adopting a healthy lifestyle and getting preventive care are important in promoting health and wellness. Follow your health care provider's instructions about healthy diet, exercising, and getting tested or screened for diseases. Follow your health care provider's instructions on monitoring your cholesterol and blood pressure. This information is not intended to replace advice given to you by your health care provider. Make sure you discuss any questions you have with your health care provider. Document Revised: 02/12/2021 Document Reviewed: 02/12/2021 Elsevier Patient Education  2023 [employer] Inc.  

## 2023-01-14 ENCOUNTER — Ambulatory Visit: Payer: 59 | Admitting: Internal Medicine

## 2023-01-16 ENCOUNTER — Other Ambulatory Visit: Payer: Self-pay | Admitting: Internal Medicine

## 2023-01-17 ENCOUNTER — Other Ambulatory Visit: Payer: Self-pay | Admitting: Internal Medicine

## 2023-01-17 ENCOUNTER — Encounter: Payer: Self-pay | Admitting: Internal Medicine

## 2023-01-17 DIAGNOSIS — M1000 Idiopathic gout, unspecified site: Secondary | ICD-10-CM

## 2023-01-17 LAB — LIPID PANEL
Chol/HDL Ratio: 2.8 ratio (ref 0.0–5.0)
Cholesterol, Total: 125 mg/dL (ref 100–199)
HDL: 45 mg/dL (ref >39)
LDL Chol Calc (NIH): 56 mg/dL (ref 0–99)
Triglycerides: 138 mg/dL (ref 0–149)
VLDL Cholesterol Cal: 24 mg/dL (ref 5–40)

## 2023-01-17 LAB — PSA
PSA: 0.3
Prostate Specific Ag, Serum: 0.3 ng/mL (ref 0.0–4.0)

## 2023-01-17 LAB — HEMOGLOBIN A1C
Est. average glucose Bld gHb Est-mCnc: 131 mg/dL
Hgb A1c MFr Bld: 6.2 % — ABNORMAL HIGH (ref 4.8–5.6)

## 2023-02-12 ENCOUNTER — Encounter: Payer: Self-pay | Admitting: Internal Medicine

## 2023-04-12 ENCOUNTER — Other Ambulatory Visit: Payer: Self-pay | Admitting: Internal Medicine

## 2023-04-12 DIAGNOSIS — I471 Supraventricular tachycardia, unspecified: Secondary | ICD-10-CM

## 2023-04-12 DIAGNOSIS — I10 Essential (primary) hypertension: Secondary | ICD-10-CM

## 2023-04-29 ENCOUNTER — Ambulatory Visit: Payer: 59 | Admitting: Internal Medicine

## 2023-05-20 ENCOUNTER — Ambulatory Visit: Payer: 59 | Admitting: Internal Medicine

## 2023-05-20 ENCOUNTER — Encounter: Payer: Self-pay | Admitting: Internal Medicine

## 2023-05-20 VITALS — BP 120/78 | HR 60 | Temp 97.9°F | Ht 68.0 in | Wt 214.4 lb

## 2023-05-20 DIAGNOSIS — R1032 Left lower quadrant pain: Secondary | ICD-10-CM

## 2023-05-20 LAB — URINALYSIS, ROUTINE W REFLEX MICROSCOPIC
Bilirubin Urine: NEGATIVE
Hgb urine dipstick: NEGATIVE
Ketones, ur: NEGATIVE
Leukocytes,Ua: NEGATIVE
Nitrite: NEGATIVE
RBC / HPF: NONE SEEN (ref 0–?)
Specific Gravity, Urine: <=1.005 — AB (ref 1.000–1.030)
Total Protein, Urine: NEGATIVE
Urine Glucose: NEGATIVE
Urobilinogen, UA: 0.2 (ref 0.0–1.0)
WBC, UA: NONE SEEN (ref 0–?)
pH: 6 (ref 5.0–8.0)

## 2023-05-20 LAB — CBC WITH DIFFERENTIAL/PLATELET
Basophils Absolute: 0 10*3/uL (ref 0.0–0.1)
Basophils Relative: 0.3 % (ref 0.0–3.0)
Eosinophils Absolute: 0.1 10*3/uL (ref 0.0–0.7)
Eosinophils Relative: 1.4 % (ref 0.0–5.0)
HCT: 45.7 % (ref 39.0–52.0)
Hemoglobin: 15.1 g/dL (ref 13.0–17.0)
Lymphocytes Relative: 30.3 % (ref 12.0–46.0)
Lymphs Abs: 2.3 10*3/uL (ref 0.7–4.0)
MCHC: 33.1 g/dL (ref 30.0–36.0)
MCV: 92.1 fl (ref 78.0–100.0)
Monocytes Absolute: 0.7 10*3/uL (ref 0.1–1.0)
Monocytes Relative: 9.3 % (ref 3.0–12.0)
Neutro Abs: 4.5 10*3/uL (ref 1.4–7.7)
Neutrophils Relative %: 58.7 % (ref 43.0–77.0)
Platelets: 229 10*3/uL (ref 150.0–400.0)
RBC: 4.96 Mil/uL (ref 4.22–5.81)
RDW: 13.6 % (ref 11.5–15.5)
WBC: 7.7 10*3/uL (ref 4.0–10.5)

## 2023-05-20 LAB — COMPREHENSIVE METABOLIC PANEL
ALT: 21 U/L (ref 0–53)
AST: 28 U/L (ref 0–37)
Albumin: 4.8 g/dL (ref 3.5–5.2)
Alkaline Phosphatase: 66 U/L (ref 39–117)
BUN: 17 mg/dL (ref 6–23)
CO2: 30 mEq/L (ref 19–32)
Calcium: 9.7 mg/dL (ref 8.4–10.5)
Chloride: 98 mEq/L (ref 96–112)
Creatinine, Ser: 1.13 mg/dL (ref 0.40–1.50)
GFR: 69.35 mL/min (ref >60.00)
Glucose, Bld: 105 mg/dL — ABNORMAL HIGH (ref 70–99)
Potassium: 4.3 mEq/L (ref 3.5–5.1)
Sodium: 137 mEq/L (ref 135–145)
Total Bilirubin: 0.7 mg/dL (ref 0.2–1.2)
Total Protein: 7.2 g/dL (ref 6.0–8.3)

## 2023-05-20 NOTE — Patient Instructions (Addendum)
      Blood work was ordered.   The lab is on the first floor.    Medications changes include :   none    A referral was ordered surgery and a Ct scan of your abdomen was ordered.  Someone will call you to schedule an appointment.     Return if symptoms worsen or fail to improve.

## 2023-05-20 NOTE — Progress Notes (Signed)
Subjective:    Patient ID: Mark [name], male    DOB: Mar 04, 1961, 62 y.o.   MRN: [id number]      [name] is here for  Chief Complaint  Patient presents with   Abdominal Pain    Previous hernia; Pain last 4 days; feels better when he applies pressure    Intermittent pain, burning, hot sensation in left inguinal region.  Pressure helps it - applying pressure to the area when straining prevents the pain.  Does not do any heavy lifting - goes to gym regularly. No obvious injury.    No obvious bulge.   S/p right inguinal hernia repair, s/p left inguinal hernia surgery repair.     Started few months ago - has gotten worse.  Last Friday it was severe.   At night the area Mark [name] sometimes when sleeping on his stomach.    BM's normal - occ constipation which is normal. No blood in stool   Medications and allergies reviewed with patient and updated if appropriate.  Current Outpatient Medications on File Prior to Visit  Medication Sig Dispense Refill   allopurinol (ZYLOPRIM) 300 MG tablet TAKE 1 TABLET BY MOUTH DAILY 90 tablet 1   celecoxib (CELEBREX) 100 MG capsule Take 1 capsule (100 mg total) by mouth 2 (two) times daily. 180 capsule 0   Cholecalciferol (VITAMIN D3) 10 MCG (400 UNIT) tablet Take by mouth.     diclofenac Sodium (VOLTAREN) 1 % GEL Apply 4 g topically 4 (four) times daily. 350 g 1   EPINEPHrine 0.3 mg/0.3 mL IJ SOAJ injection Inject 0.3 mg into the muscle as needed (ANAPHYLAXIS). 2 each 2   Glucosamine-Chondroit-Vit C-Mn (GLUCOSAMINE 1500 COMPLEX PO) Take 1 tablet by mouth daily.      metoprolol succinate (TOPROL-XL) 50 MG 24 hr tablet TAKE 1 TABLET BY MOUTH DAILY  WITH OR IMMEDIATELY FOLLOWING A  MEAL 90 tablet 0   pantoprazole (PROTONIX) 40 MG tablet TAKE 1 TABLET BY MOUTH DAILY 90 tablet 1   rizatriptan (MAXALT) 10 MG tablet Take 1 tablet (10 mg total) by mouth as needed for migraine. May repeat in 2 hours if needed 10 tablet 5   rosuvastatin (CRESTOR) 10 MG  tablet TAKE 1 TABLET BY MOUTH DAILY 90 tablet 1   triamcinolone (NASACORT) 55 MCG/ACT AERO nasal inhaler Place 2 sprays into the nose daily. 32.4 mL 5   Vitamin D, Ergocalciferol, (DRISDOL) 50000 units CAPS capsule Take 1 capsule (50,000 Units total) by mouth every 7 (seven) days. 12 capsule 0   No current facility-administered medications on file prior to visit.    Review of Systems  Constitutional:  Negative for fever.  Gastrointestinal:  Positive for abdominal pain (left inguinal pain, pain with straining) and constipation (occ). Negative for blood in stool and nausea.  Genitourinary:  Negative for dysuria, frequency and hematuria.       Objective:   Vitals:   05/20/23 1445  BP: 120/78  Pulse: 60  Temp: 97.9 F (36.6 C)  SpO2: 98%   BP Readings from Last 3 Encounters:  05/20/23 120/78  01/07/23 122/80  11/09/22 132/80   Wt Readings from Last 3 Encounters:  05/20/23 214 lb 6.4 oz (97.3 kg)  01/07/23 210 lb (95.3 kg)  10/29/22 198 lb (89.8 kg)   Body mass index is 32.6 kg/m.    Physical Exam Constitutional:      [name]: He is not in acute distress.    Appearance: He is well-developed. He is not ill-appearing.  HENT:     Head: Normocephalic and atraumatic.  Abdominal:     Tenderness: There is abdominal tenderness in the suprapubic area and left lower quadrant. There is no guarding or rebound.     Hernia: No hernia is present.  Skin:    [name]: Skin is warm and dry.  Neurological:     Mental Status: He is alert.            Assessment & Plan:    LLQ pain, left inguinal pain: Acute - subacute - started a few months ago LLQ pain, left inguinal pain - intermittent x few months - worse with straining to have a BM No obvious bulge Very tender in LLQ with palpation Concern for recurrent inguinal hernia w/ wo involvement of bowel or colitis Stat Ct Ab/pelvis Cbc, cmp, ua Referral to surgery for possible inguinal hernia

## 2023-05-21 ENCOUNTER — Ambulatory Visit (HOSPITAL_BASED_OUTPATIENT_CLINIC_OR_DEPARTMENT_OTHER)
Admission: RE | Admit: 2023-05-21 | Discharge: 2023-05-21 | Disposition: A | Discharge status: Home / Self Care | Payer: 59 | Source: Ambulatory Visit | Attending: Internal Medicine | Admitting: Internal Medicine

## 2023-05-21 DIAGNOSIS — R1032 Left lower quadrant pain: Secondary | ICD-10-CM | POA: Diagnosis present

## 2023-05-21 MED ORDER — IOHEXOL 300 MG/ML  SOLN
100.0000 mL | Freq: Once | INTRAMUSCULAR | Status: AC | PRN
  Administered 2023-05-21: 85 mL via INTRAVENOUS

## 2023-05-22 MED ORDER — AMOXICILLIN-POT CLAVULANATE 875-125 MG PO TABS: 1.0000 | ORAL_TABLET | Freq: Two times a day (BID) | ORAL | 0 refills | Status: AC

## 2023-05-22 NOTE — Addendum Note (Signed)
Addended by: Pincus Sanes on: 05/22/2023 10:30 AM   Modules accepted: Orders

## 2023-05-29 ENCOUNTER — Other Ambulatory Visit: Payer: Self-pay | Admitting: Internal Medicine

## 2023-05-29 DIAGNOSIS — K219 Gastro-esophageal reflux disease without esophagitis: Secondary | ICD-10-CM

## 2023-06-19 ENCOUNTER — Other Ambulatory Visit: Payer: Self-pay | Admitting: Internal Medicine

## 2023-06-19 DIAGNOSIS — I471 Supraventricular tachycardia, unspecified: Secondary | ICD-10-CM

## 2023-06-19 DIAGNOSIS — I10 Essential (primary) hypertension: Secondary | ICD-10-CM

## 2023-06-19 DIAGNOSIS — M1000 Idiopathic gout, unspecified site: Secondary | ICD-10-CM

## 2023-07-25 ENCOUNTER — Encounter: Payer: Self-pay | Admitting: Internal Medicine

## 2023-08-04 ENCOUNTER — Other Ambulatory Visit: Payer: Self-pay | Admitting: Internal Medicine

## 2023-08-04 DIAGNOSIS — K219 Gastro-esophageal reflux disease without esophagitis: Secondary | ICD-10-CM

## 2023-08-05 ENCOUNTER — Encounter: Payer: Self-pay | Admitting: Internal Medicine

## 2023-08-05 NOTE — Telephone Encounter (Signed)
Patient has been scheduled

## 2023-08-14 ENCOUNTER — Ambulatory Visit: Payer: 59 | Admitting: Internal Medicine

## 2023-08-14 ENCOUNTER — Ambulatory Visit: Payer: 59

## 2023-08-14 ENCOUNTER — Encounter: Payer: Self-pay | Admitting: Internal Medicine

## 2023-08-14 VITALS — BP 132/76 | HR 74 | Temp 98.0°F | Ht 68.0 in | Wt 218.6 lb

## 2023-08-14 DIAGNOSIS — R10814 Left lower quadrant abdominal tenderness: Secondary | ICD-10-CM | POA: Insufficient documentation

## 2023-08-14 DIAGNOSIS — R1319 Other dysphagia: Secondary | ICD-10-CM | POA: Diagnosis not present

## 2023-08-14 DIAGNOSIS — Z23 Encounter for immunization: Secondary | ICD-10-CM

## 2023-08-14 DIAGNOSIS — K2100 Gastro-esophageal reflux disease with esophagitis, without bleeding: Secondary | ICD-10-CM

## 2023-08-14 DIAGNOSIS — R7303 Prediabetes: Secondary | ICD-10-CM | POA: Insufficient documentation

## 2023-08-14 DIAGNOSIS — K5732 Diverticulitis of large intestine without perforation or abscess without bleeding: Secondary | ICD-10-CM

## 2023-08-14 LAB — CBC WITH DIFFERENTIAL/PLATELET
Basophils Absolute: 0 10*3/uL (ref 0.0–0.1)
Basophils Relative: 0.5 % (ref 0.0–3.0)
Eosinophils Absolute: 0.1 10*3/uL (ref 0.0–0.7)
Eosinophils Relative: 1.1 % (ref 0.0–5.0)
HCT: 44.4 % (ref 39.0–52.0)
Hemoglobin: 15.1 g/dL (ref 13.0–17.0)
Lymphocytes Relative: 21.6 % (ref 12.0–46.0)
Lymphs Abs: 1.9 10*3/uL (ref 0.7–4.0)
MCHC: 34.1 g/dL (ref 30.0–36.0)
MCV: 92.9 fL (ref 78.0–100.0)
Monocytes Absolute: 0.7 10*3/uL (ref 0.1–1.0)
Monocytes Relative: 8.2 % (ref 3.0–12.0)
Neutro Abs: 5.9 10*3/uL (ref 1.4–7.7)
Neutrophils Relative %: 68.6 % (ref 43.0–77.0)
Platelets: 252 10*3/uL (ref 150.0–400.0)
RBC: 4.78 Mil/uL (ref 4.22–5.81)
RDW: 13.2 % (ref 11.5–15.5)
WBC: 8.6 10*3/uL (ref 4.0–10.5)

## 2023-08-14 LAB — URINALYSIS, ROUTINE W REFLEX MICROSCOPIC
Bilirubin Urine: NEGATIVE
Hgb urine dipstick: NEGATIVE
Ketones, ur: NEGATIVE
Leukocytes,Ua: NEGATIVE
Nitrite: NEGATIVE
RBC / HPF: NONE SEEN (ref 0–?)
Specific Gravity, Urine: 1.025 (ref 1.000–1.030)
Total Protein, Urine: NEGATIVE
Urine Glucose: NEGATIVE
Urobilinogen, UA: 0.2 (ref 0.0–1.0)
WBC, UA: NONE SEEN (ref 0–?)
pH: 5.5 (ref 5.0–8.0)

## 2023-08-14 LAB — C-REACTIVE PROTEIN: CRP: <1 mg/dL (ref 0.5–20.0)

## 2023-08-14 LAB — LIPASE: Lipase: 23 U/L (ref 11.0–59.0)

## 2023-08-14 LAB — HEMOGLOBIN A1C: Hgb A1c MFr Bld: 6 % (ref 4.6–6.5)

## 2023-08-14 MED ORDER — VOQUEZNA 10 MG PO TABS
1.0000 | ORAL_TABLET | Freq: Every day | ORAL | Status: DC
Start: 2023-08-14 — End: 2023-09-02

## 2023-08-14 MED ORDER — VOQUEZNA 10 MG PO TABS: 1.0000 | ORAL_TABLET | Freq: Every day | ORAL | 0 refills | Status: DC

## 2023-08-14 MED ORDER — SULFAMETHOXAZOLE-TRIMETHOPRIM 800-160 MG PO TABS: 1.0000 | ORAL_TABLET | Freq: Two times a day (BID) | ORAL | 0 refills | Status: AC

## 2023-08-14 NOTE — Progress Notes (Signed)
Subjective:  Patient ID: Mark [name], male    DOB: 07-17-1961  Age: 62 y.o. MRN: [id number]  CC: Abdominal Pain   [name] presents for f/up ---  Discussed the use of AI scribe software for clinical note transcription with the patient, who gave verbal consent to proceed.  History of Present Illness   The patient, with a history of diverticulitis, presents with recurrent episodes of abdominal pain, described as a burning sensation, located centrally and radiating upwards. The most recent episode occurred last night, severe enough to cause the patient to curl up in pain. Accompanying symptoms included mild nausea and diarrhea. The patient also reported a history of constipation, with bowel habits alternating between constipation and normal. They are considering the use of a mild laxative.  The patient also reported a history of heartburn, which is currently managed with a PPI. The patient also reported difficulty swallowing, with a sensation of a blockage in the throat, and occasional choking when drinking. They also reported a burning sensation in the throat when brushing teeth.  The patient had a recent episode of COVID-19, with mild fever, which resolved a few weeks ago. They denied any recent fever or chills. They also denied any urinary symptoms or blood in the urine. The patient had a recent elbow surgery and is currently experiencing tendonitis in the other elbow, which they are managing with electro-stimulation.  The patient's weight was stable at 218 pounds, although they expressed a desire to lose weight. They reported no recent changes in medication and denied taking any antibiotics currently. They had been prescribed a strong antibiotic by another doctor for a previous episode of abdominal pain.       Outpatient Medications Prior to Visit  Medication Sig Dispense Refill  . allopurinol (ZYLOPRIM) 300 MG tablet TAKE 1 TABLET BY MOUTH DAILY 90 tablet 0  . Cholecalciferol  (VITAMIN D3) 10 MCG (400 UNIT) tablet Take by mouth.    . diclofenac Sodium (VOLTAREN) 1 % GEL Apply 4 g topically 4 (four) times daily. 350 g 1  . EPINEPHrine 0.3 mg/0.3 mL IJ SOAJ injection Inject 0.3 mg into the muscle as needed (ANAPHYLAXIS). 2 each 2  . Glucosamine-Chondroit-Vit C-Mn (GLUCOSAMINE 1500 COMPLEX PO) Take 1 tablet by mouth daily.     . metoprolol succinate (TOPROL-XL) 50 MG 24 hr tablet TAKE 1 TABLET BY MOUTH DAILY  WITH OR IMMEDIATELY FOLLOWING A  MEAL 90 tablet 0  . rizatriptan (MAXALT) 10 MG tablet Take 1 tablet (10 mg total) by mouth as needed for migraine. May repeat in 2 hours if needed 10 tablet 5  . rosuvastatin (CRESTOR) 10 MG tablet TAKE 1 TABLET BY MOUTH DAILY 90 tablet 1  . pantoprazole (PROTONIX) 40 MG tablet TAKE 1 TABLET BY MOUTH DAILY 90 tablet 0  . celecoxib (CELEBREX) 100 MG capsule Take 1 capsule (100 mg total) by mouth 2 (two) times daily. 180 capsule 0  . triamcinolone (NASACORT) 55 MCG/ACT AERO nasal inhaler Place 2 sprays into the nose daily. 32.4 mL 5  . Vitamin D, Ergocalciferol, (DRISDOL) 50000 units CAPS capsule Take 1 capsule (50,000 Units total) by mouth every 7 (seven) days. 12 capsule 0   No facility-administered medications prior to visit.    ROS Review of Systems  Constitutional:  Negative for appetite change, chills, diaphoresis, fatigue and fever.  HENT:  Positive for trouble swallowing.   Eyes:  Negative for visual disturbance.  Respiratory:  Negative for cough, chest tightness, shortness of breath and  wheezing.   Cardiovascular:  Negative for chest pain, palpitations and leg swelling.  Gastrointestinal:  Positive for abdominal pain, constipation, diarrhea and nausea. Negative for anal bleeding, blood in stool and vomiting.  Genitourinary: Negative.  Negative for difficulty urinating, dysuria, flank pain, hematuria, scrotal swelling, testicular pain and urgency.  Musculoskeletal:  Positive for arthralgias and back pain. Negative for joint  swelling and myalgias.  Skin: Negative.  Negative for color change.  Neurological: Negative.  Negative for dizziness and weakness.  Hematological:  Negative for adenopathy. Does not bruise/bleed easily.  Psychiatric/Behavioral: Negative.      Objective:  BP 132/76 (BP Location: Left Arm, Patient Position: Sitting, Cuff Size: Large)   Pulse 74   Temp 98 F (36.7 C) (Oral)   Ht 5\' 8"  (1.727 m)   Wt 218 lb 9.6 oz (99.2 kg)   SpO2 95%   BMI 33.24 kg/m   BP Readings from Last 3 Encounters:  08/14/23 132/76  05/20/23 120/78  01/07/23 122/80    Wt Readings from Last 3 Encounters:  08/14/23 218 lb 9.6 oz (99.2 kg)  05/20/23 214 lb 6.4 oz (97.3 kg)  01/07/23 210 lb (95.3 kg)    Physical Exam Vitals reviewed.  Constitutional:      General: He is not in acute distress.    Appearance: He is not ill-appearing, toxic-appearing or diaphoretic.  HENT:     Nose: Nose normal.     Mouth/Throat:     Mouth: Mucous membranes are moist.  Eyes:     General: No scleral icterus.    Conjunctiva/sclera: Conjunctivae normal.  Cardiovascular:     Rate and Rhythm: Normal rate and regular rhythm.     Heart sounds: No murmur heard.    No friction rub. No gallop.  Pulmonary:     Effort: Pulmonary effort is normal.     Breath sounds: No stridor. No wheezing, rhonchi or rales.  Abdominal:     Palpations: There is no mass.     Tenderness: There is abdominal tenderness in the right lower quadrant, suprapubic area and left lower quadrant. There is no guarding or rebound.     Hernia: No hernia is present. There is no hernia in the left inguinal area or right inguinal area.  Genitourinary:    Pubic Area: No rash.      Penis: Normal and circumcised.      Testes: Normal.     Epididymis:     Right: Normal.     Left: Normal.     Prostate: Normal. Not enlarged, not tender and no nodules present.     Rectum: Normal. Guaiac result negative. No mass, tenderness, anal fissure or external hemorrhoid.  Normal anal tone.  Musculoskeletal:        General: Normal range of motion.     Cervical back: Neck supple.     Right lower leg: No edema.     Left lower leg: No edema.  Lymphadenopathy:     Cervical: No cervical adenopathy.     Lower Body: No right inguinal adenopathy. No left inguinal adenopathy.  Skin:    General: Skin is warm and dry.  Neurological:     General: No focal deficit present.     Mental Status: He is alert. Mental status is at baseline.  Psychiatric:        Mood and Affect: Mood normal.        Behavior: Behavior normal.    Lab Results  Component Value Date   WBC 8.6  08/14/2023   HGB 15.1 08/14/2023   HCT 44.4 08/14/2023   PLT 252.0 08/14/2023   GLUCOSE 105 (H) 05/20/2023   CHOL 125 01/16/2023   TRIG 138 01/16/2023   HDL 45 01/16/2023   LDLCALC 56 01/16/2023   ALT 21 05/20/2023   AST 28 05/20/2023   NA 137 05/20/2023   K 4.3 05/20/2023   CL 98 05/20/2023   CREATININE 1.13 05/20/2023   BUN 17 05/20/2023   CO2 30 05/20/2023   TSH 1.120 12/19/2021   PSA 0.3 01/17/2023   HGBA1C 6.0 08/14/2023    CT ABDOMEN PELVIS W CONTRAST  Result Date: 05/21/2023 CLINICAL DATA:  Left lower quadrant abdominal pain. History of inguinal hernia repairs. EXAM: CT ABDOMEN AND PELVIS WITH CONTRAST TECHNIQUE: Multidetector CT imaging of the abdomen and pelvis was performed using the standard protocol following bolus administration of intravenous contrast. RADIATION DOSE REDUCTION: This exam was performed according to the departmental dose-optimization program which includes automated exposure control, adjustment of the mA and/or kV according to patient size and/or use of iterative reconstruction technique. CONTRAST:  85mL OMNIPAQUE IOHEXOL 300 MG/ML  SOLN COMPARISON:  03/02/2019 FINDINGS: Lower chest: Unremarkable Hepatobiliary: Unremarkable Pancreas: Unremarkable Spleen: Unremarkable Adrenals/Urinary Tract: Unremarkable Stomach/Bowel: Sigmoid colon diverticulosis noted, equivocal  appearance for proximal sigmoid acute diverticulitis. No extraluminal gas or abscess. Normal appendix. Vascular/Lymphatic: Unremarkable Reproductive: Prostate gland unremarkable. Along the left spermatic cord a 8.2 by 3.6 by 3.5 cm (volume = 54 cm^3) fluid density structure above the testicle is suspicious for a spermatic cord hydrocele. No recurrent hernia identified. Other: No supplemental non-categorized findings. Musculoskeletal: Mild bridging spurring of both sacroiliac joints anteriorly. Grade 1 degenerative anterolisthesis at L4-5. Lumbar spondylosis and degenerative disc disease contribute to bilateral foraminal impingement at the L4-5 and L5-S1 level. There also appears to be facet spurring contributing to left greater than right foraminal impingement at the T10-11 level. IMPRESSION: 1. Sigmoid colon diverticulosis with equivocal appearance for proximal sigmoid acute diverticulitis. No extraluminal gas or abscess. 2. 54 cc fluid density structure along the left spermatic cord is suspicious for a spermatic cord hydrocele. 3. Lumbar spondylosis and degenerative disc disease contribute to bilateral foraminal impingement at L4-5 and L5-S1 and left greater than right foraminal impingement at T10-11. Electronically Signed   By: Gaylyn Rong M.D.   On: 05/21/2023 16:57    DG ABD ACUTE 2+V W 1V CHEST  Result Date: 08/14/2023 CLINICAL DATA:  Lower abdominal pain for 2 months. EXAM: DG ABDOMEN ACUTE WITH 1 VIEW CHEST COMPARISON:  Feb 15, 2019. FINDINGS: There is no evidence of dilated bowel loops or free intraperitoneal air. No radiopaque calculi or other significant radiographic abnormality is seen. Heart size and mediastinal contours are within normal limits. Both lungs are clear. IMPRESSION: No abnormal bowel dilatation.  No acute cardiopulmonary disease. Electronically Signed   By: Lupita Raider M.D.   On: 08/14/2023 11:23     Assessment & Plan:  Need for immunization against influenza -     Flu  vaccine trivalent PF, 6mos and older(Flulaval,Afluria,Fluarix,Fluzone)  Esophageal dysphagia -     DG ABD ACUTE 2+V W 1V CHEST; Future -     Ambulatory referral to Gastroenterology  Gastroesophageal reflux disease with esophagitis without hemorrhage -     Voquezna; Take 1 tablet by mouth daily. -     Voquezna; Take 1 tablet by mouth daily.  Dispense: 90 tablet; Refill: 0 -     CBC with Differential/Platelet; Future  Prediabetes -  Hemoglobin A1c; Future  Left lower quadrant abdominal tenderness without rebound tenderness - The exam is not concerning for acute abdominal process. Labs and xray are reassuring. Will treat for diverticulitis. -     Lipase; Future -     CBC with Differential/Platelet; Future -     Urinalysis, Routine w reflex microscopic; Future -     C-reactive protein; Future -     DG ABD ACUTE 2+V W 1V CHEST; Future  Diverticulitis of large intestine without perforation or abscess without bleeding -     Sulfamethoxazole-Trimethoprim; Take 1 tablet by mouth 2 (two) times daily for 7 days.  Dispense: 14 tablet; Refill: 0     Follow-up: Return if symptoms worsen or fail to improve.  Sanda Linger, MD

## 2023-08-14 NOTE — Patient Instructions (Signed)
Abdominal Pain, Adult  Pain in the abdomen (abdominal pain) can be caused by many things. In most cases, it gets better with no treatment or by being treated at home. But in some cases, it can be serious. Your health care provider will ask questions about your medical history and do a physical exam to try to figure out what is causing your pain. Follow these instructions at home: Medicines Take over-the-counter and prescription medicines only as told by your provider. Do not take medicines that help you poop (laxatives) unless told by your provider. General instructions Watch your condition for any changes. Drink enough fluid to keep your pee (urine) pale yellow. Contact a health care provider if: Your pain changes, gets worse, or lasts longer than expected. You have severe cramping or bloating in your abdomen, or you vomit. Your pain gets worse with meals, after eating, or with certain foods. You are constipated or have diarrhea for more than 2-3 days. You are not hungry, or you lose weight without trying. You have signs of dehydration. These may include: Dark pee, very little pee, or no pee. Cracked lips or dry mouth. Sleepiness or weakness. You have pain when you pee (urinate) or poop. Your abdominal pain wakes you up at night. You have blood in your pee. You have a fever. Get help right away if: You cannot stop vomiting. Your pain is only in one part of the abdomen. Pain on the right side could be caused by appendicitis. You have bloody or black poop (stool), or poop that looks like tar. You have trouble breathing. You have chest pain. These symptoms may be an emergency. Get help right away. Call 911. Do not wait to see if the symptoms will go away. Do not drive yourself to the hospital. This information is not intended to replace advice given to you by your health care provider. Make sure you discuss any questions you have with your health care provider. Document Revised:  07/10/2022 Document Reviewed: 07/10/2022 [employer] Patient Education  2024 [employer]  

## 2023-08-15 ENCOUNTER — Other Ambulatory Visit: Payer: Self-pay

## 2023-08-15 DIAGNOSIS — K2100 Gastro-esophageal reflux disease with esophagitis, without bleeding: Secondary | ICD-10-CM

## 2023-08-15 MED ORDER — VOQUEZNA 10 MG PO TABS: 1.0000 | ORAL_TABLET | Freq: Every day | ORAL | 0 refills | Status: DC

## 2023-08-26 ENCOUNTER — Other Ambulatory Visit: Payer: Self-pay | Admitting: Internal Medicine

## 2023-08-26 DIAGNOSIS — I10 Essential (primary) hypertension: Secondary | ICD-10-CM

## 2023-08-26 DIAGNOSIS — I471 Supraventricular tachycardia, unspecified: Secondary | ICD-10-CM

## 2023-08-27 ENCOUNTER — Other Ambulatory Visit: Payer: Self-pay | Admitting: Internal Medicine

## 2023-08-27 DIAGNOSIS — M1000 Idiopathic gout, unspecified site: Secondary | ICD-10-CM

## 2023-09-02 ENCOUNTER — Ambulatory Visit: Payer: 59 | Admitting: Gastroenterology

## 2023-09-02 ENCOUNTER — Encounter: Payer: Self-pay | Admitting: Gastroenterology

## 2023-09-02 ENCOUNTER — Encounter: Payer: Self-pay | Admitting: Internal Medicine

## 2023-09-02 ENCOUNTER — Ambulatory Visit: Payer: 59 | Admitting: Internal Medicine

## 2023-09-02 VITALS — BP 122/82 | HR 68 | Temp 98.2°F | Ht 68.0 in | Wt 220.8 lb

## 2023-09-02 VITALS — BP 120/78 | HR 63 | Ht 68.0 in | Wt 220.0 lb

## 2023-09-02 DIAGNOSIS — R10814 Left lower quadrant abdominal tenderness: Secondary | ICD-10-CM

## 2023-09-02 DIAGNOSIS — K219 Gastro-esophageal reflux disease without esophagitis: Secondary | ICD-10-CM

## 2023-09-02 DIAGNOSIS — N1831 Chronic kidney disease, stage 3a: Secondary | ICD-10-CM

## 2023-09-02 DIAGNOSIS — K2100 Gastro-esophageal reflux disease with esophagitis, without bleeding: Secondary | ICD-10-CM | POA: Diagnosis not present

## 2023-09-02 DIAGNOSIS — R1310 Dysphagia, unspecified: Secondary | ICD-10-CM | POA: Diagnosis not present

## 2023-09-02 DIAGNOSIS — Z8719 Personal history of other diseases of the digestive system: Secondary | ICD-10-CM | POA: Diagnosis not present

## 2023-09-02 DIAGNOSIS — R198 Other specified symptoms and signs involving the digestive system and abdomen: Secondary | ICD-10-CM

## 2023-09-02 DIAGNOSIS — I10 Essential (primary) hypertension: Secondary | ICD-10-CM

## 2023-09-02 LAB — CBC WITH DIFFERENTIAL/PLATELET
Basophils Absolute: 0 10*3/uL (ref 0.0–0.1)
Basophils Relative: 0.5 % (ref 0.0–3.0)
Eosinophils Absolute: 0.1 10*3/uL (ref 0.0–0.7)
Eosinophils Relative: 1.3 % (ref 0.0–5.0)
HCT: 43.6 % (ref 39.0–52.0)
Hemoglobin: 14.4 g/dL (ref 13.0–17.0)
Lymphocytes Relative: 25.9 % (ref 12.0–46.0)
Lymphs Abs: 1.4 10*3/uL (ref 0.7–4.0)
MCHC: 33.2 g/dL (ref 30.0–36.0)
MCV: 94 fL (ref 78.0–100.0)
Monocytes Absolute: 0.6 10*3/uL (ref 0.1–1.0)
Monocytes Relative: 11.2 % (ref 3.0–12.0)
Neutro Abs: 3.4 10*3/uL (ref 1.4–7.7)
Neutrophils Relative %: 61.1 % (ref 43.0–77.0)
Platelets: 241 10*3/uL (ref 150.0–400.0)
RBC: 4.63 Mil/uL (ref 4.22–5.81)
RDW: 13.2 % (ref 11.5–15.5)
WBC: 5.5 10*3/uL (ref 4.0–10.5)

## 2023-09-02 LAB — BASIC METABOLIC PANEL
BUN: 19 mg/dL (ref 6–23)
CO2: 28 meq/L (ref 19–32)
Calcium: 9.3 mg/dL (ref 8.4–10.5)
Chloride: 102 meq/L (ref 96–112)
Creatinine, Ser: 1.59 mg/dL — ABNORMAL HIGH (ref 0.40–1.50)
GFR: 45.94 mL/min — ABNORMAL LOW (ref >60.00)
Glucose, Bld: 100 mg/dL — ABNORMAL HIGH (ref 70–99)
Potassium: 4.5 meq/L (ref 3.5–5.1)
Sodium: 138 meq/L (ref 135–145)

## 2023-09-02 LAB — LIPASE: Lipase: 22 U/L (ref 11.0–59.0)

## 2023-09-02 MED ORDER — VOQUEZNA 10 MG PO TABS
1.0000 | ORAL_TABLET | Freq: Every day | ORAL | 0 refills | Status: DC
Start: 2023-09-02 — End: 2023-09-30

## 2023-09-02 MED ORDER — NA SULFATE-K SULFATE-MG SULF 17.5-3.13-1.6 GM/177ML PO SOLN: 1.0000 | Freq: Once | ORAL | 0 refills | Status: AC

## 2023-09-02 NOTE — Progress Notes (Addendum)
Subjective:  Patient ID: Mark [name], male    DOB: 1960-12-10  Age: 62 y.o. MRN: [id number]  CC: Abdominal Pain   [name] presents for f/up ----  Discussed the use of AI scribe software for clinical note transcription with the patient, who gave verbal consent to proceed.  History of Present Illness   The patient, previously prescribed Voquezna, reports having run out of the medication two days prior to the consultation. He has been experiencing significant abdominal discomfort, particularly during the night, which he describes as originating around the navel and radiating upwards. The discomfort is severe enough to disrupt his sleep, particularly when lying on his stomach. Despite the discomfort, the patient reports being able to maintain his daily activities and gym routine.  The patient completed a course of antibiotics, which provided some relief but did not fully resolve the symptoms. He has been experiencing bouts of constipation followed by diarrhea, which he attributes to the antibiotics. The patient denies any nausea, vomiting, fever, chills, unexplained weight loss, or blood in the stool.  The patient has been scheduled to see a gastroenterologist and is awaiting further guidance. He reports tenderness in the suprapubic area and left lower quadrant on palpation. The patient has a history of diverticulitis and has been advised to monitor for blood in the stool, which he denies observing. He is due for a kidney function test and a scan to investigate the possibility of an abscess.       Outpatient Medications Prior to Visit  Medication Sig Dispense Refill   allopurinol (ZYLOPRIM) 300 MG tablet TAKE 1 TABLET BY MOUTH DAILY 90 tablet 0   Cholecalciferol (VITAMIN D3) 10 MCG (400 UNIT) tablet Take by mouth.     diclofenac Sodium (VOLTAREN) 1 % GEL Apply 4 g topically 4 (four) times daily. 350 g 1   EPINEPHrine 0.3 mg/0.3 mL IJ SOAJ injection Inject 0.3 mg into the muscle as  needed (ANAPHYLAXIS). 2 each 2   Glucosamine-Chondroit-Vit C-Mn (GLUCOSAMINE 1500 COMPLEX PO) Take 1 tablet by mouth daily.      metoprolol succinate (TOPROL-XL) 50 MG 24 hr tablet TAKE 1 TABLET BY MOUTH DAILY  WITH OR IMMEDIATELY FOLLOWING A  MEAL 90 tablet 0   rizatriptan (MAXALT) 10 MG tablet Take 1 tablet (10 mg total) by mouth as needed for migraine. May repeat in 2 hours if needed 10 tablet 5   rosuvastatin (CRESTOR) 10 MG tablet TAKE 1 TABLET BY MOUTH DAILY 90 tablet 1   Vonoprazan Fumarate (VOQUEZNA) 10 MG TABS Take 1 tablet by mouth daily. 90 tablet 0   Vonoprazan Fumarate (VOQUEZNA) 10 MG TABS Take 1 tablet by mouth daily.     No facility-administered medications prior to visit.    ROS Review of Systems  Constitutional:  Negative for chills, diaphoresis, fatigue and fever.  HENT: Negative.  Negative for sore throat and trouble swallowing.   Eyes: Negative.   Respiratory:  Negative for cough, chest tightness, shortness of breath, wheezing and stridor.   Cardiovascular:  Negative for chest pain, palpitations and leg swelling.  Gastrointestinal:  Positive for abdominal pain. Negative for abdominal distention, anal bleeding, blood in stool, constipation, diarrhea, nausea, rectal pain and vomiting.  Endocrine: Negative.   Genitourinary: Negative.  Negative for difficulty urinating, dysuria, flank pain and hematuria.  Musculoskeletal: Negative.  Negative for arthralgias and myalgias.  Skin: Negative.   Neurological: Negative.  Negative for dizziness, weakness, light-headedness and headaches.  Hematological:  Negative for adenopathy. Does not bruise/bleed  easily.  Psychiatric/Behavioral: Negative.      Objective:  BP 122/82   Pulse 68   Temp 98.2 F (36.8 C) (Oral)   Ht 5\' 8"  (1.727 m)   Wt 220 lb 12.8 oz (100.2 kg)   SpO2 97%   BMI 33.57 kg/m   BP Readings from Last 3 Encounters:  09/02/23 120/78  09/02/23 122/82  08/14/23 132/76    Wt Readings from Last 3 Encounters:   09/02/23 220 lb (99.8 kg)  09/02/23 220 lb 12.8 oz (100.2 kg)  08/14/23 218 lb 9.6 oz (99.2 kg)    Physical Exam Vitals reviewed.  Constitutional:      Appearance: He is well-developed. He is not ill-appearing.  HENT:     Nose: Nose normal.     Mouth/Throat:     Mouth: Mucous membranes are moist.  Eyes:     General: No scleral icterus.    Conjunctiva/sclera: Conjunctivae normal.  Cardiovascular:     Rate and Rhythm: Normal rate and regular rhythm.     Heart sounds: No murmur heard.    No friction rub. No gallop.  Pulmonary:     Effort: Pulmonary effort is normal.     Breath sounds: No stridor. No wheezing, rhonchi or rales.  Abdominal:     General: Abdomen is flat. Bowel sounds are normal. There is no distension.     Palpations: There is no hepatomegaly, splenomegaly or mass.     Tenderness: There is abdominal tenderness in the suprapubic area and left lower quadrant. There is no guarding or rebound.     Hernia: No hernia is present.  Musculoskeletal:        General: Normal range of motion.     Cervical back: Neck supple.     Right lower leg: No edema.     Left lower leg: No edema.  Lymphadenopathy:     Cervical: No cervical adenopathy.  Skin:    General: Skin is warm and dry.     Findings: No lesion or rash.  Neurological:     General: No focal deficit present.     Mental Status: He is alert. Mental status is at baseline.  Psychiatric:        Mood and Affect: Mood normal.        Behavior: Behavior normal.     Lab Results  Component Value Date   WBC 5.5 09/02/2023   HGB 14.4 09/02/2023   HCT 43.6 09/02/2023   PLT 241.0 09/02/2023   GLUCOSE 100 (H) 09/02/2023   CHOL 125 01/16/2023   TRIG 138 01/16/2023   HDL 45 01/16/2023   LDLCALC 56 01/16/2023   ALT 21 05/20/2023   AST 28 05/20/2023   NA 138 09/02/2023   K 4.5 09/02/2023   CL 102 09/02/2023   CREATININE 1.59 (H) 09/02/2023   BUN 19 09/02/2023   CO2 28 09/02/2023   TSH 1.120 12/19/2021   PSA 0.3  01/17/2023   HGBA1C 6.0 08/14/2023    CT ABDOMEN PELVIS W CONTRAST  Result Date: 05/21/2023 CLINICAL DATA:  Left lower quadrant abdominal pain. History of inguinal hernia repairs. EXAM: CT ABDOMEN AND PELVIS WITH CONTRAST TECHNIQUE: Multidetector CT imaging of the abdomen and pelvis was performed using the standard protocol following bolus administration of intravenous contrast. RADIATION DOSE REDUCTION: This exam was performed according to the departmental dose-optimization program which includes automated exposure control, adjustment of the mA and/or kV according to patient size and/or use of iterative reconstruction technique. CONTRAST:  85mL OMNIPAQUE  IOHEXOL 300 MG/ML  SOLN COMPARISON:  03/02/2019 FINDINGS: Lower chest: Unremarkable Hepatobiliary: Unremarkable Pancreas: Unremarkable Spleen: Unremarkable Adrenals/Urinary Tract: Unremarkable Stomach/Bowel: Sigmoid colon diverticulosis noted, equivocal appearance for proximal sigmoid acute diverticulitis. No extraluminal gas or abscess. Normal appendix. Vascular/Lymphatic: Unremarkable Reproductive: Prostate gland unremarkable. Along the left spermatic cord a 8.2 by 3.6 by 3.5 cm (volume = 54 cm^3) fluid density structure above the testicle is suspicious for a spermatic cord hydrocele. No recurrent hernia identified. Other: No supplemental non-categorized findings. Musculoskeletal: Mild bridging spurring of both sacroiliac joints anteriorly. Grade 1 degenerative anterolisthesis at L4-5. Lumbar spondylosis and degenerative disc disease contribute to bilateral foraminal impingement at the L4-5 and L5-S1 level. There also appears to be facet spurring contributing to left greater than right foraminal impingement at the T10-11 level. IMPRESSION: 1. Sigmoid colon diverticulosis with equivocal appearance for proximal sigmoid acute diverticulitis. No extraluminal gas or abscess. 2. 54 cc fluid density structure along the left spermatic cord is suspicious for a  spermatic cord hydrocele. 3. Lumbar spondylosis and degenerative disc disease contribute to bilateral foraminal impingement at L4-5 and L5-S1 and left greater than right foraminal impingement at T10-11. Electronically Signed   By: Gaylyn Rong M.D.   On: 05/21/2023 16:57    Assessment & Plan:  Gastroesophageal reflux disease with esophagitis without hemorrhage -     CBC with Differential/Platelet; Future -     Voquezna; Take 1 tablet by mouth daily.  Dispense: 90 tablet; Refill: 0  Left lower quadrant abdominal tenderness without rebound tenderness - Symptoms are suspicious for recurrent diverticulitis, possibly an abscess.  I recommended a stat CT scan. -     CT ABDOMEN PELVIS W CONTRAST; Future -     Lipase; Future  Essential hypertension, benign - His BP is well controlled. -     Basic metabolic panel; Future -     CBC with Differential/Platelet; Future     Follow-up: Return in about 3 weeks (around 09/23/2023).  Sanda Linger, MD

## 2023-09-02 NOTE — Progress Notes (Signed)
Attending Physician's Attestation   I have reviewed the chart.   I agree with the Advanced Practitioner's note, impression, and recommendations with any updates as below. Agree with workup as outlined with the CT scan being the next step for this patient with persisting symptomatology concerning for smoldering diverticulitis.  If no evidence of active diverticulitis but symptoms are still persisting, then EGD/colonoscopy is reasonable for further workup of his upper GI symptoms as well.  Please keep me UTD with the results of the CT scan.  If active diverticulitis is present, would wait for colonoscopy for at least 4 to 6 weeks and will need to trial a different round of antibiotic therapy.   Corliss Parish, MD [facility] Advanced Endoscopy Office # [phone]

## 2023-09-02 NOTE — Progress Notes (Signed)
Chief Complaint: history of diverticulitis Primary GI MD: Gentry Fitz  HPI: 62 year old with medical history as listed below presents for evaluation of recent episode of diverticulitis and history of dysphagia.  Patient has a remote history of arrhythmia which she was evaluated 5 years ago during hospital stay.  Has not been his issue for him since and he longer follows with cardiology  Discussed the use of AI scribe software for clinical note transcription with the patient, who gave verbal consent to proceed.  The patient, with a history of diverticulitis and GERD, presents with persistent abdominal pain and dysphagia. Despite a two-week course of Augmentin followed by a week of Bactrim, the patient reports persistent discomfort, suggesting unresolved diverticulitis.   CT abdomen pelvis with contrast 05/21/2023 showing proximal sigmoid acute diverticulitis without complication.  Abdominal x-ray 08/14/2023 which was normal.  Patient was seen by PCP 2 hours before our evaluation and he has a CT abdomen pelvis with contrast scheduled and BMP, CBC, lipase were obtained and are pending  The pain is localized to the left lower quadrant, occasionally radiating upwards, and is exacerbated by physical activity and wearing tight clothing. The patient also reports alternating constipation and diarrhea, with relief of pain following bowel movements.  He states he can go 2 days without a bowel movement and then will have profuse diarrhea and feel better. Often his baseline is constipation.  In addition to the abdominal discomfort, the patient has been experiencing difficulty swallowing for an extended period. The patient describes a sensation of pills getting stuck at the suprasternal notch, sometimes regurgitating back into the mouth. This dysphagia extends to both solid foods and liquids, with the latter often going down the windpipe, causing choking. The patient also reports a gravelly voice and  occasional hoarseness. A history of erosive esophagitis was noted in the remote past in [place] on endoscopy, but no recent endoscopy has been performed.  The patient also reports chronic GERD, which was well-controlled on Pantoprazole until recently discontinued due to concerns about potential adverse effects on the intestinal tract from PCP. Since discontinuation, the patient reports constant GERD symptoms, managed intermittently with Tums.  PCP put him on Voquenza which has helped with his GERD as well but not with his dysphagia.  The patient maintains a health-conscious lifestyle, including regular exercise and a diet low in sugar. However, the patient notes that without daily exercise, bowel movements become difficult. The patient also reports a history of arrhythmia and exposure to radiation during [employer] service on [employer].      PREVIOUS GI WORKUP   Colonoscopy in 2012 - no polyps per patient report Cologuard 2 years ago-negative  Past Medical History:  Diagnosis Date   COVID-19    Diverticulosis    Frequent headaches    GERD (gastroesophageal reflux disease)    H/O cardiac arrhythmia    H/O phlebitis    Heart murmur    History of chicken pox    Hyperlipidemia    Hypertension    Idiopathic gout    SVT (supraventricular tachycardia) (HCC)     Past Surgical History:  Procedure Laterality Date   HERNIA REPAIR     Left elbow radial collateral ligament repair  09/2012    Current Outpatient Medications  Medication Sig Dispense Refill   allopurinol (ZYLOPRIM) 300 MG tablet TAKE 1 TABLET BY MOUTH DAILY 90 tablet 0   Cholecalciferol (VITAMIN D3) 10 MCG (400 UNIT) tablet Take by mouth.     diclofenac Sodium (  VOLTAREN) 1 % GEL Apply 4 g topically 4 (four) times daily. 350 g 1   EPINEPHrine 0.3 mg/0.3 mL IJ SOAJ injection Inject 0.3 mg into the muscle as needed (ANAPHYLAXIS). 2 each 2   Glucosamine-Chondroit-Vit C-Mn (GLUCOSAMINE 1500 COMPLEX PO) Take 1 tablet by  mouth daily.      metoprolol succinate (TOPROL-XL) 50 MG 24 hr tablet TAKE 1 TABLET BY MOUTH DAILY  WITH OR IMMEDIATELY FOLLOWING A  MEAL 90 tablet 0   Na Sulfate-K Sulfate-Mg Sulf 17.5-3.13-1.6 GM/177ML SOLN Take 1 kit by mouth once for 1 dose. 354 mL 0   rizatriptan (MAXALT) 10 MG tablet Take 1 tablet (10 mg total) by mouth as needed for migraine. May repeat in 2 hours if needed 10 tablet 5   rosuvastatin (CRESTOR) 10 MG tablet TAKE 1 TABLET BY MOUTH DAILY 90 tablet 1   Vonoprazan Fumarate (VOQUEZNA) 10 MG TABS Take 1 tablet by mouth daily. 90 tablet 0   No current facility-administered medications for this visit.    Allergies as of 09/02/2023 - Review Complete 09/02/2023  Allergen Reaction Noted   Cold buster [zinc] Hives 08/08/2015   Percocet [oxycodone-acetaminophen]  10/21/2019    Family History  Problem Relation Age of Onset   Lung cancer Mother    Arthritis Mother    Hyperlipidemia Mother    Hypertension Mother    Alcohol abuse Father    Heart disease Father    Prostate cancer Father    Hyperlipidemia Father    Stroke Father    Hypertension Father    Heart attack Father    Alcohol abuse Sister    Drug abuse Sister    Alcohol abuse Brother    Heart attack Brother    Anemia Paternal Grandfather    Colon cancer Neg Hx    Pancreatic cancer Neg Hx    Stomach cancer Neg Hx    Esophageal cancer Neg Hx     Social History   Socioeconomic History   Marital status: Married    Spouse name: Not on file   Number of children: Not on file   Years of education: Not on file   Highest education level: Not on file  Occupational History   Not on file  Tobacco Use   Smoking status: Never   Smokeless tobacco: Never  Substance and Sexual Activity   Alcohol use: No   Drug use: No   Sexual activity: Yes  Other Topics Concern   Not on file  Social History Narrative   Not on file   Social Determinants of Health   Financial Resource Strain: Not on file  Food Insecurity:  Not on file  Transportation Needs: Not on file  Physical Activity: Not on file  Stress: Not on file  Social Connections: Not on file  Intimate Partner Violence: Not on file    Review of Systems:    Constitutional: No weight loss, fever, chills, weakness or fatigue HEENT: Eyes: No change in vision               Ears, Nose, Throat:  No change in hearing or congestion Skin: No rash or itching Cardiovascular: No chest pain, chest pressure or palpitations   Respiratory: No SOB or cough Gastrointestinal: See HPI and otherwise negative Genitourinary: No dysuria or change in urinary frequency Neurological: No headache, dizziness or syncope Musculoskeletal: No new muscle or joint pain Hematologic: No bleeding or bruising Psychiatric: No history of depression or anxiety    Physical Exam:  Vital  signs: BP 120/78 (BP Location: Left Arm, Patient Position: Sitting, Cuff Size: Normal)   Pulse 63   Ht 5\' 8"  (1.727 m)   Wt 220 lb (99.8 kg)   SpO2 97%   BMI 33.45 kg/m   Constitutional: NAD, Well developed, Well nourished, alert and cooperative Head:  Normocephalic and atraumatic. Eyes:   PEERL, EOMI. No icterus. Conjunctiva pink. Respiratory: Respirations even and unlabored. Lungs clear to auscultation bilaterally.   No wheezes, crackles, or rhonchi.  Cardiovascular:  Regular rate and rhythm. No peripheral edema, cyanosis or pallor.  Gastrointestinal:  Soft, nondistended, nontender. No rebound or guarding. Normal bowel sounds. No appreciable masses or hepatomegaly. Rectal:  Not performed.  Msk:  Symmetrical without gross deformities. Without edema, no deformity or joint abnormality.  Neurologic:  Alert and  oriented x4;  grossly normal neurologically.  Skin:   Dry and intact without significant lesions or rashes. Psychiatric: Oriented to person, place and time. Demonstrates good judgement and reason without abnormal affect or behaviors.   RELEVANT LABS AND IMAGING: CBC    Component  Value Date/Time   WBC 8.6 08/14/2023 0931   RBC 4.78 08/14/2023 0931   HGB 15.1 08/14/2023 0931   HGB 15.1 12/06/2022 0711   HCT 44.4 08/14/2023 0931   HCT 44.3 12/06/2022 0711   PLT 252.0 08/14/2023 0931   PLT 228 12/06/2022 0711   MCV 92.9 08/14/2023 0931   MCV 92 12/06/2022 0711   MCV 86.0 09/14/2013 0000   MCH 31.3 12/06/2022 0711   MCH 31.0 07/21/2015 1518   MCHC 34.1 08/14/2023 0931   RDW 13.2 08/14/2023 0931   RDW 12.9 12/06/2022 0711   RDW 13.8 08/19/2014 0000   LYMPHSABS 1.9 08/14/2023 0931   LYMPHSABS 2.3 12/06/2022 0711   MONOABS 0.7 08/14/2023 0931   EOSABS 0.1 08/14/2023 0931   EOSABS 0.1 12/06/2022 0711   EOSABS 0 08/19/2014 0000   BASOSABS 0.0 08/14/2023 0931   BASOSABS 0.0 12/06/2022 0711    CMP     Component Value Date/Time   NA 137 05/20/2023 1517   NA 140 12/06/2022 0711   K 4.3 05/20/2023 1517   K 5.2 08/19/2014 0000   CL 98 05/20/2023 1517   CL 100 08/19/2014 0000   CO2 30 05/20/2023 1517   CO2 24 08/19/2014 0000   GLUCOSE 105 (H) 05/20/2023 1517   BUN 17 05/20/2023 1517   BUN 15 12/06/2022 0711   CREATININE 1.13 05/20/2023 1517   CALCIUM 9.7 05/20/2023 1517   CALCIUM 9.4 08/19/2014 0000   PROT 7.2 05/20/2023 1517   PROT 6.4 12/06/2022 0711   ALBUMIN 4.8 05/20/2023 1517   ALBUMIN 4.6 12/06/2022 0711   AST 28 05/20/2023 1517   AST 17 08/19/2014 0000   ALT 21 05/20/2023 1517   ALKPHOS 66 05/20/2023 1517   ALKPHOS 65 08/19/2014 0000   BILITOT 0.7 05/20/2023 1517   BILITOT 0.6 12/06/2022 0711   BILITOT 0.5 08/19/2014 0000   GFRNONAA 71 05/09/2020 1540   GFRNONAA 1.23 08/19/2014 0000   GFRAA 82 05/09/2020 1540   GFRAA 66 08/19/2014 0000     Assessment/Plan:      History of diverticulitis Recent history of proximal sigmoid diverticulitis treated with Augmentin x 2 weeks and then Bactrim x 1 week. First episode of diverticulitis. Current symptoms of abdominal pain and altered bowel habits. -- Wait for CT ab/pelvis to rule out  worsening diverticulitis. Obtain before colonoscopy. If diverticulitis has resolved, proceed with colonoscopy. We will go ahead and schedule it  for late December as patient needs it done by the new year for insurance purposes. Will cancel if CT shows persistent diverticulitis.  Dysphagia Reports of difficulty swallowing pills and solids, with sensation of obstruction at the suprasternal notch. History of erosive esophagitis per patient report. --Schedule EGD with potential for dilation if stricture is present.  Evaluate for esophagitis, gastritis, PUD. -Consider resuming Pantoprazole for GERD control and potential esophagitis, pending endoscopy results. -- I thoroughly discussed the procedure with the patient (at bedside) to include nature of the procedure, alternatives, benefits, and risks (including but not limited to bleeding, infection, perforation, anesthesia/cardiac pulmonary complications).  Patient verbalized understanding and gave verbal consent to proceed with procedure.   Gastroesophageal Reflux Disease (GERD) Chronic symptoms of heartburn, previously controlled on Pantoprazole. Currently on Voquenza. -Advise resumption of Pantoprazole if symptoms are severe, pending endoscopy results. -Advise avoidance of Tums due to potential for worsening constipation.  General Health Maintenance -Advise use of MiraLAX for constipation. -Advise patient to seek emergency care for severe pain, nausea, vomiting, or fever.      Lara Mulch [facility] 09/02/2023, 12:29 PM  Cc: Etta Grandchild, MD

## 2023-09-02 NOTE — Patient Instructions (Signed)
You have been scheduled for an endoscopy and colonoscopy. Please follow the written instructions given to you at your visit today.  Please pick up your prep supplies at the pharmacy within the next 1-3 days.  If you use inhalers (even only as needed), please bring them with you on the day of your procedure.  DO NOT TAKE 7 DAYS PRIOR TO TEST- Trulicity (dulaglutide) Ozempic, Wegovy (semaglutide) Mounjaro (tirzepatide) Bydureon Bcise (exanatide extended release)  DO NOT TAKE 1 DAY PRIOR TO YOUR TEST Rybelsus (semaglutide) Adlyxin (lixisenatide) Victoza (liraglutide) Byetta (exanatide) ___________________________________________________________________________  _______________________________________________________  If your blood pressure at your visit was 140/90 or greater, please contact your primary care physician to follow up on this.  _______________________________________________________  If you are age 77 or older, your body mass index should be between 23-30. Your Body mass index is 33.45 kg/m. If this is out of the aforementioned range listed, please consider follow up with your Primary Care Provider.  If you are age 28 or younger, your body mass index should be between 19-25. Your Body mass index is 33.45 kg/m. If this is out of the aformentioned range listed, please consider follow up with your Primary Care Provider.   ________________________________________________________  The [facility] providers would like to encourage you to use [facility] to communicate with providers for non-urgent requests or questions.  Due to long hold times on the telephone, sending your provider a message by [facility] may be a faster and more efficient way to get a response.  Please allow 48 business hours for a response.  Please remember that this is for non-urgent requests.  _______________________________________________________  I appreciate the opportunity to care for you. Bayley McMichael  PA-C

## 2023-09-02 NOTE — Patient Instructions (Signed)
Abdominal Pain, Adult  Pain in the abdomen (abdominal pain) can be caused by many things. In most cases, it gets better with no treatment or by being treated at home. But in some cases, it can be serious. Your health care provider will ask questions about your medical history and do a physical exam to try to figure out what is causing your pain. Follow these instructions at home: Medicines Take over-the-counter and prescription medicines only as told by your provider. Do not take medicines that help you poop (laxatives) unless told by your provider. General instructions Watch your condition for any changes. Drink enough fluid to keep your pee (urine) pale yellow. Contact a health care provider if: Your pain changes, gets worse, or lasts longer than expected. You have severe cramping or bloating in your abdomen, or you vomit. Your pain gets worse with meals, after eating, or with certain foods. You are constipated or have diarrhea for more than 2-3 days. You are not hungry, or you lose weight without trying. You have signs of dehydration. These may include: Dark pee, very little pee, or no pee. Cracked lips or dry mouth. Sleepiness or weakness. You have pain when you pee (urinate) or poop. Your abdominal pain wakes you up at night. You have blood in your pee. You have a fever. Get help right away if: You cannot stop vomiting. Your pain is only in one part of the abdomen. Pain on the right side could be caused by appendicitis. You have bloody or black poop (stool), or poop that looks like tar. You have trouble breathing. You have chest pain. These symptoms may be an emergency. Get help right away. Call 911. Do not wait to see if the symptoms will go away. Do not drive yourself to the hospital. This information is not intended to replace advice given to you by your health care provider. Make sure you discuss any questions you have with your health care provider. Document Revised:  07/10/2022 Document Reviewed: 07/10/2022 Elsevier Patient Education  2024 [employer].

## 2023-09-06 DIAGNOSIS — N1831 Chronic kidney disease, stage 3a: Secondary | ICD-10-CM | POA: Insufficient documentation

## 2023-09-06 NOTE — Addendum Note (Signed)
Addended by: Etta Grandchild on: 09/06/2023 11:39 AM   Modules accepted: Level of Service

## 2023-09-08 ENCOUNTER — Ambulatory Visit
Admission: RE | Admit: 2023-09-08 | Discharge: 2023-09-08 | Disposition: A | Discharge status: Home / Self Care | Payer: 59 | Source: Ambulatory Visit | Attending: Internal Medicine | Admitting: Internal Medicine

## 2023-09-08 DIAGNOSIS — R10814 Left lower quadrant abdominal tenderness: Secondary | ICD-10-CM

## 2023-09-08 MED ORDER — IOPAMIDOL (ISOVUE-300) INJECTION 61%
100.0000 mL | Freq: Once | INTRAVENOUS | Status: AC | PRN
Start: 2023-09-08 — End: 2023-09-08
  Administered 2023-09-08: 100 mL via INTRAVENOUS

## 2023-09-09 ENCOUNTER — Other Ambulatory Visit: Payer: Self-pay | Admitting: Internal Medicine

## 2023-09-09 ENCOUNTER — Encounter: Payer: Self-pay | Admitting: Internal Medicine

## 2023-09-09 DIAGNOSIS — K5732 Diverticulitis of large intestine without perforation or abscess without bleeding: Secondary | ICD-10-CM

## 2023-09-09 MED ORDER — AMOXICILLIN-POT CLAVULANATE 875-125 MG PO TABS: 1.0000 | ORAL_TABLET | Freq: Two times a day (BID) | ORAL | 0 refills | Status: AC

## 2023-09-30 ENCOUNTER — Ambulatory Visit: Payer: 59 | Admitting: Gastroenterology

## 2023-09-30 ENCOUNTER — Encounter: Payer: Self-pay | Admitting: Gastroenterology

## 2023-09-30 VITALS — BP 126/80 | HR 74 | Temp 97.3°F | Resp 14 | Ht 68.0 in | Wt 220.0 lb

## 2023-09-30 DIAGNOSIS — K5730 Diverticulosis of large intestine without perforation or abscess without bleeding: Secondary | ICD-10-CM | POA: Diagnosis not present

## 2023-09-30 DIAGNOSIS — K449 Diaphragmatic hernia without obstruction or gangrene: Secondary | ICD-10-CM

## 2023-09-30 DIAGNOSIS — K2289 Other specified disease of esophagus: Secondary | ICD-10-CM

## 2023-09-30 DIAGNOSIS — K641 Second degree hemorrhoids: Secondary | ICD-10-CM

## 2023-09-30 DIAGNOSIS — Z1211 Encounter for screening for malignant neoplasm of colon: Secondary | ICD-10-CM

## 2023-09-30 DIAGNOSIS — K2950 Unspecified chronic gastritis without bleeding: Secondary | ICD-10-CM

## 2023-09-30 DIAGNOSIS — K6389 Other specified diseases of intestine: Secondary | ICD-10-CM

## 2023-09-30 DIAGNOSIS — Z8719 Personal history of other diseases of the digestive system: Secondary | ICD-10-CM

## 2023-09-30 DIAGNOSIS — K219 Gastro-esophageal reflux disease without esophagitis: Secondary | ICD-10-CM

## 2023-09-30 LAB — HM COLONOSCOPY

## 2023-09-30 MED ORDER — PANTOPRAZOLE SODIUM 40 MG PO TBEC: 40.0000 mg | DELAYED_RELEASE_TABLET | Freq: Every day | ORAL | 6 refills | Status: DC

## 2023-09-30 MED ORDER — SODIUM CHLORIDE 0.9 % IV SOLN: 500.0000 mL | INTRAVENOUS | Status: DC

## 2023-09-30 NOTE — Op Note (Signed)
[facility] Patient Name: Mark [name] Procedure Date: 09/30/2023 8:29 AM MRN: [id number] Endoscopist: Corliss Parish , MD, [id number] Age: 62 Referring MD:  Date of Birth: 1961/09/22 Gender: Male Account #: [id number] Procedure:                Upper GI endoscopy Indications:              Dysphagia, Suspected gastro-esophageal reflux                            disease Medicines:                Monitored Anesthesia Care Procedure:                Pre-Anesthesia Assessment:                           - Prior to the procedure, a History and Physical                            was performed, and patient medications and                            allergies were reviewed. The patient's tolerance of                            previous anesthesia was also reviewed. The risks                            and benefits of the procedure and the sedation                            options and risks were discussed with the patient.                            All questions were answered, and informed consent                            was obtained. Prior Anticoagulants: The patient has                            taken no anticoagulant or antiplatelet agents. ASA                            Grade Assessment: II - A patient with mild systemic                            disease. After reviewing the risks and benefits,                            the patient was deemed in satisfactory condition to                            undergo the procedure.  After obtaining informed consent, the endoscope was                            passed under direct vision. Throughout the                            procedure, the patient's blood pressure, pulse, and                            oxygen saturations were monitored continuously. The                            GIF [id number] #[id number] was introduced through the                            mouth, and advanced to the second part of  duodenum.                            The upper GI endoscopy was accomplished without                            difficulty. The patient tolerated the procedure. Scope In: Scope Out: Findings:                 No gross lesions were noted in the entire                            esophagus. Biopsies were taken with a cold forceps                            for histology. A guidewire was placed and the scope                            was withdrawn. Dilation was performed with a Savary                            dilator with mild resistance at 18 mm. The dilation                            site was examined following endoscope reinsertion                            and showed no change.                           The Z-line was irregular and was found 36 cm from                            the incisors.                           A 2 cm hiatal hernia was present.  Patchy mildly erythematous mucosa without bleeding                            was found in the entire examined stomach. Biopsies                            were taken with a cold forceps for histology and                            Helicobacter pylori testing.                           Patchy mildly erythematous mucosa without active                            bleeding and with no stigmata of bleeding was found                            in the duodenal bulb, in the first portion of the                            duodenum and in the second portion of the duodenum.                            Biopsies were taken with a cold forceps for                            histology. Complications:            No immediate complications. Estimated Blood Loss:     Estimated blood loss was minimal. Impression:               - No gross lesions in the entire esophagus.                            Biopsied. Dilated.                           - Z-line irregular, 36 cm from the incisors.                           - 2 cm hiatal  hernia.                           - Erythematous mucosa in the stomach. Biopsied.                           - Erythematous duodenopathy. Biopsied. Recommendation:           - Proceed to scheduled colonoscopy.                           - Monitor for signs/symptoms of bleeding,                            perforation, and infection. If issues please  call                            our number to get further assistance as needed.                           - Dilation diet as per protocol.                           - Please use Cepacol or Halls Lozenges +/-                            Chloraseptic spray for next 72-96 hours to aid in                            sore thoat should you experience this.                           - I don't see a PPI on board currently. Could                            restart Pantoprazole vs restart of Voquenza or                            trial Aciphex or Dexilant if necessary.                           - pH impedence testing may be required if we still                            have concerns about NERD v Functional heartburn or                            esophageal hypersensitivity.                           - If dysphagia symptoms persist, will need to                            consider additional workup including esophageal                            manometry.                           - The findings and recommendations were discussed                            with the patient.                           - The findings and recommendations were discussed                            with the patient's family. Corliss Parish, MD 09/30/2023 9:13:15  AM

## 2023-09-30 NOTE — Progress Notes (Unsigned)
GASTROENTEROLOGY PROCEDURE H&P NOTE   Primary Care Physician: Etta Grandchild, MD  HPI: Mark [name] is a 62 y.o. male who presents for  EGD/colonoscopy for evaluation of dysphagia and GERD symptoms on Voquenza and Colonoscopy for recurrent issues of diverticulitis.  Past Medical History:  Diagnosis Date   COVID-19    Diverticulosis    Frequent headaches    GERD (gastroesophageal reflux disease)    H/O cardiac arrhythmia    H/O phlebitis    Heart murmur    History of chicken pox    Hyperlipidemia    Hypertension    Idiopathic gout    SVT (supraventricular tachycardia) (HCC)    Past Surgical History:  Procedure Laterality Date   HERNIA REPAIR     Left elbow radial collateral ligament repair  09/2012   Current Outpatient Medications  Medication Sig Dispense Refill   allopurinol (ZYLOPRIM) 300 MG tablet TAKE 1 TABLET BY MOUTH DAILY 90 tablet 0   Cholecalciferol (VITAMIN D3) 10 MCG (400 UNIT) tablet Take by mouth.     diclofenac Sodium (VOLTAREN) 1 % GEL Apply 4 g topically 4 (four) times daily. 350 g 1   EPINEPHrine 0.3 mg/0.3 mL IJ SOAJ injection Inject 0.3 mg into the muscle as needed (ANAPHYLAXIS). 2 each 2   Glucosamine-Chondroit-Vit C-Mn (GLUCOSAMINE 1500 COMPLEX PO) Take 1 tablet by mouth daily.      metoprolol succinate (TOPROL-XL) 50 MG 24 hr tablet TAKE 1 TABLET BY MOUTH DAILY  WITH OR IMMEDIATELY FOLLOWING A  MEAL 90 tablet 0   rizatriptan (MAXALT) 10 MG tablet Take 1 tablet (10 mg total) by mouth as needed for migraine. May repeat in 2 hours if needed 10 tablet 5   rosuvastatin (CRESTOR) 10 MG tablet TAKE 1 TABLET BY MOUTH DAILY 90 tablet 1   Vonoprazan Fumarate (VOQUEZNA) 10 MG TABS Take 1 tablet by mouth daily. 90 tablet 0   Current Facility-Administered Medications  Medication Dose Route Frequency Provider Last Rate Last Admin   0.9 %  sodium chloride infusion  500 mL Intravenous Continuous Mansouraty, Netty Starring., MD        Current Outpatient  Medications:    allopurinol (ZYLOPRIM) 300 MG tablet, TAKE 1 TABLET BY MOUTH DAILY, Disp: 90 tablet, Rfl: 0   Cholecalciferol (VITAMIN D3) 10 MCG (400 UNIT) tablet, Take by mouth., Disp: , Rfl:    diclofenac Sodium (VOLTAREN) 1 % GEL, Apply 4 g topically 4 (four) times daily., Disp: 350 g, Rfl: 1   EPINEPHrine 0.3 mg/0.3 mL IJ SOAJ injection, Inject 0.3 mg into the muscle as needed (ANAPHYLAXIS)., Disp: 2 each, Rfl: 2   Glucosamine-Chondroit-Vit C-Mn (GLUCOSAMINE 1500 COMPLEX PO), Take 1 tablet by mouth daily. , Disp: , Rfl:    metoprolol succinate (TOPROL-XL) 50 MG 24 hr tablet, TAKE 1 TABLET BY MOUTH DAILY  WITH OR IMMEDIATELY FOLLOWING A  MEAL, Disp: 90 tablet, Rfl: 0   rizatriptan (MAXALT) 10 MG tablet, Take 1 tablet (10 mg total) by mouth as needed for migraine. May repeat in 2 hours if needed, Disp: 10 tablet, Rfl: 5   rosuvastatin (CRESTOR) 10 MG tablet, TAKE 1 TABLET BY MOUTH DAILY, Disp: 90 tablet, Rfl: 1   Vonoprazan Fumarate (VOQUEZNA) 10 MG TABS, Take 1 tablet by mouth daily., Disp: 90 tablet, Rfl: 0  Current Facility-Administered Medications:    0.9 %  sodium chloride infusion, 500 mL, Intravenous, Continuous, Mansouraty, Netty Starring., MD Allergies  Allergen Reactions   Cold Buster [Zinc] Hives   Percocet [Oxycodone-Acetaminophen]  Family History  Problem Relation Age of Onset   Lung cancer Mother    Arthritis Mother    Hyperlipidemia Mother    Hypertension Mother    Alcohol abuse Father    Heart disease Father    Prostate cancer Father    Hyperlipidemia Father    Stroke Father    Hypertension Father    Heart attack Father    Alcohol abuse Sister    Drug abuse Sister    Alcohol abuse Brother    Heart attack Brother    Anemia Paternal Grandfather    Colon cancer Neg Hx    Pancreatic cancer Neg Hx    Stomach cancer Neg Hx    Esophageal cancer Neg Hx    Social History   Socioeconomic History   Marital status: Married    Spouse name: Not on file   Number of  children: Not on file   Years of education: Not on file   Highest education level: Not on file  Occupational History   Not on file  Tobacco Use   Smoking status: Never   Smokeless tobacco: Never  Substance and Sexual Activity   Alcohol use: No   Drug use: No   Sexual activity: Yes  Other Topics Concern   Not on file  Social History Narrative   Not on file   Social Drivers of Health   Financial Resource Strain: Not on file  Food Insecurity: Not on file  Transportation Needs: Not on file  Physical Activity: Not on file  Stress: Not on file  Social Connections: Not on file  Intimate Partner Violence: Not on file    Physical Exam: Today's Vitals   09/30/23 0813  BP: 137/89  Pulse: 80  Temp: (!) 97.3 F (36.3 C)  TempSrc: Skin  SpO2: 97%  Weight: 220 lb (99.8 kg)  Height: 5\' 8"  (1.727 m)   Body mass index is 33.45 kg/m. GEN: NAD EYE: Sclerae anicteric ENT: MMM CV: Non-tachycardic GI: Soft, NT/ND NEURO:  Alert & Oriented x 3  Lab Results: No results for input(s): "WBC", "HGB", "HCT", "PLT" in the last 72 hours. BMET No results for input(s): "NA", "K", "CL", "CO2", "GLUCOSE", "BUN", "CREATININE", "CALCIUM" in the last 72 hours. LFT No results for input(s): "PROT", "ALBUMIN", "AST", "ALT", "ALKPHOS", "BILITOT", "BILIDIR", "IBILI" in the last 72 hours. PT/INR No results for input(s): "LABPROT", "INR" in the last 72 hours.   Impression / Plan: This is a 62 y.o.male who presents for  EGD/colonoscopy for evaluation of dysphagia and GERD symptoms on Voquenza and Colonoscopy for recurrent issues of diverticulitis.  The risks and benefits of endoscopic evaluation/treatment were discussed with the patient and/or family; these include but are not limited to the risk of perforation, infection, bleeding, missed lesions, lack of diagnosis, severe illness requiring hospitalization, as well as anesthesia and sedation related illnesses.  The patient's history has been  reviewed, patient examined, no change in status, and deemed stable for procedure.  The patient and/or family is agreeable to proceed.    Corliss Parish, MD [facility]oscopy Office # [phone]

## 2023-09-30 NOTE — Patient Instructions (Signed)
Please follow dilation diet instructions provided.    YOU HAD AN ENDOSCOPIC PROCEDURE TODAY AT THE [facility]:   Refer to the procedure report that was given to you for any specific questions about what was found during the examination.  If the procedure report does not answer your questions, please call your gastroenterologist to clarify.  If you requested that your care partner not be given the details of your procedure findings, then the procedure report has been included in a sealed envelope for you to review at your convenience later.  YOU SHOULD EXPECT: Some feelings of bloating in the abdomen. Passage of more gas than usual.  Walking can help get rid of the air that was put into your GI tract during the procedure and reduce the bloating. If you had a lower endoscopy (such as a colonoscopy or flexible sigmoidoscopy) you may notice spotting of blood in your stool or on the toilet paper. If you underwent a bowel prep for your procedure, you may not have a normal bowel movement for a few days.  Please Note:  You might notice some irritation and congestion in your nose or some drainage.  This is from the oxygen used during your procedure.  There is no need for concern and it should clear up in a day or so.  SYMPTOMS TO REPORT IMMEDIATELY:  Following lower endoscopy (colonoscopy or flexible sigmoidoscopy):  Excessive amounts of blood in the stool  Significant tenderness or worsening of abdominal pains  Swelling of the abdomen that is new, acute  Fever of 100F or higher  Following upper endoscopy (EGD)  Vomiting of blood or coffee ground material  New chest pain or pain under the shoulder blades  Painful or persistently difficult swallowing  New shortness of breath  Fever of 100F or higher  Black, tarry-looking stools  For urgent or emergent issues, a gastroenterologist can be reached at any hour by calling [phone]. Do not use MyChart messaging for urgent concerns.     DIET:  Follow dilation diet instructions.  Drink plenty of fluids but you should avoid alcoholic beverages for 24 hours.  ACTIVITY:  You should plan to take it easy for the rest of today and you should NOT DRIVE or use heavy machinery until tomorrow (because of the sedation medicines used during the test).    FOLLOW UP: Our staff will call the number listed on your records the next business day following your procedure.  We will call around 7:15- 8:00 am to check on you and address any questions or concerns that you may have regarding the information given to you following your procedure. If we do not reach you, we will leave a message.     If any biopsies were taken you will be contacted by phone or by letter within the next 1-3 weeks.  Please call us at [phone] if you have not heard about the biopsies in 3 weeks.    SIGNATURES/CONFIDENTIALITY: You and/or your care partner have signed paperwork which will be entered into your electronic medical record.  These signatures attest to the fact that that the information above on your After Visit Summary has been reviewed and is understood.  Full responsibility of the confidentiality of this discharge information lies with you and/or your care-partner.

## 2023-09-30 NOTE — Op Note (Signed)
[facility] Patient Name: Mark [name] Procedure Date: 09/30/2023 8:29 AM MRN: [id number] Endoscopist: Corliss Parish , MD, [id number] Age: 62 Referring MD:  Date of Birth: [birth year] Gender: Male Account #: [id number] Procedure:                Colonoscopy Indications:              Screening for colorectal malignant neoplasm,                            Incidental - Abnormal CT of the GI tract,                            Incidental - Follow-up of diverticulitis Medicines:                Monitored Anesthesia Care Procedure:                Pre-Anesthesia Assessment:                           - Prior to the procedure, a History and Physical                            was performed, and patient medications and                            allergies were reviewed. The patient's tolerance of                            previous anesthesia was also reviewed. The risks                            and benefits of the procedure and the sedation                            options and risks were discussed with the patient.                            All questions were answered, and informed consent                            was obtained. Prior Anticoagulants: The patient has                            taken no anticoagulant or antiplatelet agents. ASA                            Grade Assessment: II - A patient with mild systemic                            disease. After reviewing the risks and benefits,                            the patient was deemed in satisfactory condition to  undergo the procedure.                           After obtaining informed consent, the colonoscope                            was passed under direct vision. Throughout the                            procedure, the patient's blood pressure, pulse, and                            oxygen saturations were monitored continuously. The                            CF [id number] #[id number] was  introduced through the anus                            and advanced to the 3 cm into the ileum. The                            colonoscopy was performed without difficulty. The                            patient tolerated the procedure. The quality of the                            bowel preparation was good. The ileocecal valve,                            appendiceal orifice, and rectum were photographed. Scope In: 8:55:32 AM Scope Out: 9:05:54 AM Scope Withdrawal Time: 0 hours 8 minutes 30 seconds  Total Procedure Duration: 0 hours 10 minutes 22 seconds  Findings:                 The digital rectal exam findings include                            hemorrhoids. Pertinent negatives include no                            palpable rectal lesions.                           Multiple small-mouthed diverticula were found in                            the recto-sigmoid colon, sigmoid colon and                            descending colon. No evidence of pus extrusion from                            any of the diverticula noted.  An area of mildly congested mucosa was found in the                            recto-sigmoid colon and in the sigmoid colon -not                            completely consistent with diverticulitis. This was                            biopsied with a cold forceps for histology.                           Normal mucosa was found in the entire colon                            otherwise.                           Non-bleeding non-thrombosed internal hemorrhoids                            were found during retroflexion, during perianal                            exam and during digital exam. The hemorrhoids were                            Grade II (internal hemorrhoids that prolapse but                            reduce spontaneously). Complications:            No immediate complications. Estimated Blood Loss:     Estimated blood loss was  minimal. Impression:               - Hemorrhoids found on digital rectal exam.                           - Diverticulosis in the recto-sigmoid colon, in the                            sigmoid colon and in the descending colon (no pus                            noted from any diverticulosis).                           - Congested mucosa in the recto-sigmoid colon and                            in the sigmoid colon -not consistent with overt                            diverticulitis. Biopsied.                           -  Normal mucosa in the entire examined colon                            otherwise.                           - Non-bleeding non-thrombosed internal hemorrhoids. Recommendation:           - The patient will be observed post-procedure,                            until all discharge criteria are met.                           - Discharge patient to home.                           - Patient has a contact number available for                            emergencies. The signs and symptoms of potential                            delayed complications were discussed with the                            patient. Return to normal activities tomorrow.                            Written discharge instructions were provided to the                            patient.                           - High fiber diet.                           - Use FiberCon 1-2 tablets PO daily.                           - Await pathology results.                           - It is not completely clear to me that active                            diverticulitis is still occurring at this time. We                            will see the biopsy results. Will discuss with                            patient, consideration of updated cross-sectional  imaging is reasonable, since patient still has some                            abdominal discomfort that is persisting. Query the                             role of surgical evaluation if this "smoldering                            diverticulitis continues to be an issue for                            patient".                           - Repeat colonoscopy in 10 years for screening                            purposes.                           - The findings and recommendations were discussed                            with the patient.                           - The findings and recommendations were discussed                            with the patient's family. Corliss Parish, MD 09/30/2023 9:19:37 AM

## 2023-09-30 NOTE — Progress Notes (Unsigned)
Patient states there have been no changes to medical or surgical history since time of pre-visit. 

## 2023-09-30 NOTE — Progress Notes (Signed)
Called to room to assist during endoscopic procedure.  Patient ID and intended procedure confirmed with present staff. Received instructions for my participation in the procedure from the performing physician.  

## 2023-09-30 NOTE — Progress Notes (Unsigned)
Sedate, gd SR, tolerated procedure well, VSS, report to RN 

## 2023-10-03 ENCOUNTER — Telehealth: Payer: Self-pay

## 2023-10-03 NOTE — Telephone Encounter (Signed)
NO answer, could not leave message.

## 2023-10-06 ENCOUNTER — Encounter: Payer: Self-pay | Admitting: Gastroenterology

## 2023-10-06 LAB — SURGICAL PATHOLOGY

## 2023-11-02 ENCOUNTER — Other Ambulatory Visit: Payer: Self-pay | Admitting: Internal Medicine

## 2023-11-02 DIAGNOSIS — I471 Supraventricular tachycardia, unspecified: Secondary | ICD-10-CM

## 2023-11-02 DIAGNOSIS — I10 Essential (primary) hypertension: Secondary | ICD-10-CM

## 2023-11-02 DIAGNOSIS — M1000 Idiopathic gout, unspecified site: Secondary | ICD-10-CM

## 2023-12-09 ENCOUNTER — Encounter: Payer: Self-pay | Admitting: Internal Medicine

## 2023-12-10 ENCOUNTER — Other Ambulatory Visit: Payer: Self-pay | Admitting: Internal Medicine

## 2023-12-10 ENCOUNTER — Other Ambulatory Visit: Payer: Self-pay

## 2023-12-10 DIAGNOSIS — E785 Hyperlipidemia, unspecified: Secondary | ICD-10-CM

## 2023-12-10 MED ORDER — ROSUVASTATIN CALCIUM 10 MG PO TABS: 10.0000 mg | ORAL_TABLET | Freq: Every day | ORAL | 0 refills | Status: DC

## 2023-12-10 MED ORDER — PANTOPRAZOLE SODIUM 40 MG PO TBEC: 40.0000 mg | DELAYED_RELEASE_TABLET | Freq: Every day | ORAL | 0 refills | Status: DC

## 2023-12-31 ENCOUNTER — Ambulatory Visit: Admitting: Internal Medicine

## 2023-12-31 ENCOUNTER — Encounter: Payer: Self-pay | Admitting: Internal Medicine

## 2023-12-31 VITALS — BP 130/66 | HR 89 | Temp 98.1°F | Ht 68.0 in | Wt 223.4 lb

## 2023-12-31 DIAGNOSIS — I471 Supraventricular tachycardia, unspecified: Secondary | ICD-10-CM

## 2023-12-31 DIAGNOSIS — I10 Essential (primary) hypertension: Secondary | ICD-10-CM

## 2023-12-31 DIAGNOSIS — E785 Hyperlipidemia, unspecified: Secondary | ICD-10-CM | POA: Diagnosis not present

## 2023-12-31 DIAGNOSIS — R7303 Prediabetes: Secondary | ICD-10-CM

## 2023-12-31 DIAGNOSIS — K5732 Diverticulitis of large intestine without perforation or abscess without bleeding: Secondary | ICD-10-CM

## 2023-12-31 DIAGNOSIS — N1831 Chronic kidney disease, stage 3a: Secondary | ICD-10-CM

## 2023-12-31 DIAGNOSIS — M1009 Idiopathic gout, multiple sites: Secondary | ICD-10-CM

## 2023-12-31 DIAGNOSIS — G629 Polyneuropathy, unspecified: Secondary | ICD-10-CM

## 2023-12-31 MED ORDER — METOPROLOL SUCCINATE ER 50 MG PO TB24: 50.0000 mg | ORAL_TABLET | Freq: Every day | ORAL | 0 refills | Status: DC

## 2023-12-31 MED ORDER — ROSUVASTATIN CALCIUM 10 MG PO TABS: 10.0000 mg | ORAL_TABLET | Freq: Every day | ORAL | 1 refills | Status: DC

## 2023-12-31 NOTE — Progress Notes (Unsigned)
 Subjective:  Patient ID: Mark [name], male    DOB: 09/26/1961  Age: 63 y.o. MRN: [id number]  CC: Abdominal Pain   [name] presents for f/up ----  Discussed the use of AI scribe software for clinical note transcription with the patient, who gave verbal consent to proceed.  History of Present Illness   Mark [name] is a 63 year old male with diverticulitis and neuropathy who presents with a flare-up of diverticulitis and worsening neuropathy symptoms.  He is experiencing a flare-up of diverticulitis, which he attributes to consuming energy drinks during a recent [employer] course. The flare-up is characterized by inflammation around the umbilical region, without nausea, vomiting, or significant changes in bowel habits, although he occasionally experiences diarrhea. His symptoms improved with the use of a probiotic from [facility], but he forgot to take it during the course, which he believes contributed to the flare-up. He recently had a colonoscopy, which showed diverticular sacs but no perforations.  He reports worsening burning pain and numbness in his feet, associated with neuropathy. The symptoms are exacerbated at night when his feet touch the sheets, waking him from sleep. He mentions reading about neuropathy as a potential reaction to COVID-19 infection.  He has a history of arrhythmia and is currently taking metoprolol. He experienced palpitations bot no shortness of breath after consuming energy drinks, which he usually avoids due to his arrhythmia.   He experiences chronic pain, particularly in the back and legs, managed with Motrin. He takes 4 tablets in the morning and 4 at night to manage symptoms, describing the pain as 'burning like fire'.  He is actively engaged in physical activity, having gained muscle mass through regular gym workouts. He is mindful of not lifting heavy weights to avoid injury.       Outpatient Medications Prior to Visit  Medication Sig  Dispense Refill   allopurinol (ZYLOPRIM) 300 MG tablet TAKE 1 TABLET BY MOUTH DAILY 90 tablet 0   Cholecalciferol (VITAMIN D3) 10 MCG (400 UNIT) tablet Take by mouth.     diclofenac Sodium (VOLTAREN) 1 % GEL Apply 4 g topically 4 (four) times daily. 350 g 1   EPINEPHrine 0.3 mg/0.3 mL IJ SOAJ injection Inject 0.3 mg into the muscle as needed (ANAPHYLAXIS). 2 each 2   Glucosamine-Chondroit-Vit C-Mn (GLUCOSAMINE 1500 COMPLEX PO) Take 1 tablet by mouth daily.      pantoprazole (PROTONIX) 40 MG tablet Take 1 tablet (40 mg total) by mouth daily. 30 tablet 0   rizatriptan (MAXALT) 10 MG tablet Take 1 tablet (10 mg total) by mouth as needed for migraine. May repeat in 2 hours if needed 10 tablet 5   metoprolol succinate (TOPROL-XL) 50 MG 24 hr tablet TAKE 1 TABLET BY MOUTH DAILY  WITH OR IMMEDIATELY FOLLOWING A  MEAL 90 tablet 0   rosuvastatin (CRESTOR) 10 MG tablet Take 1 tablet (10 mg total) by mouth daily. 30 tablet 0   No facility-administered medications prior to visit.    ROS Review of Systems  Constitutional: Negative.  Negative for appetite change, chills, diaphoresis, fatigue and fever.  HENT: Negative.    Eyes: Negative.   Respiratory:  Negative for cough, chest tightness, shortness of breath and wheezing.   Cardiovascular:  Positive for palpitations. Negative for chest pain and leg swelling.  Gastrointestinal:  Positive for abdominal pain. Negative for constipation, diarrhea, nausea and vomiting.  Genitourinary:  Negative for difficulty urinating and dysuria.  Musculoskeletal:  Positive for back pain.  Skin:  Negative.   Neurological:  Negative for dizziness and weakness.  Hematological:  Negative for adenopathy. Does not bruise/bleed easily.  Psychiatric/Behavioral: Negative.      Objective:  BP 130/66 (BP Location: Left Arm, Patient Position: Sitting, Cuff Size: Large)   Pulse 89   Temp 98.1 F (36.7 C) (Oral)   Ht 5\' 8"  (1.727 m)   Wt 223 lb 6.4 oz (101.3 kg)   SpO2 97%    BMI 33.97 kg/m   BP Readings from Last 3 Encounters:  12/31/23 130/66  09/30/23 126/80  09/02/23 120/78    Wt Readings from Last 3 Encounters:  12/31/23 223 lb 6.4 oz (101.3 kg)  09/30/23 220 lb (99.8 kg)  09/02/23 220 lb (99.8 kg)    Physical Exam Vitals reviewed.  Constitutional:      Appearance: He is well-developed. He is not ill-appearing.  HENT:     Nose: Nose normal.     Mouth/Throat:     Mouth: Mucous membranes are moist.  Eyes:     General: No scleral icterus.    Conjunctiva/sclera: Conjunctivae normal.  Cardiovascular:     Rate and Rhythm: Normal rate and regular rhythm.     Heart sounds: No murmur heard.    No friction rub. No gallop.     Comments: EKG- NSR, 66 bpm No LVH, Q waves, or ST/T wave changes  Unchanged  Pulmonary:     Effort: Pulmonary effort is normal.     Breath sounds: No stridor. No wheezing, rhonchi or rales.  Abdominal:     General: Abdomen is flat. Bowel sounds are normal. There is no distension.     Palpations: Abdomen is soft. There is no hepatomegaly, splenomegaly or mass.     Tenderness: There is abdominal tenderness in the suprapubic area. There is no guarding or rebound.  Musculoskeletal:        General: Normal range of motion.     Cervical back: Neck supple.     Right lower leg: No edema.     Left lower leg: No edema.  Lymphadenopathy:     Cervical: No cervical adenopathy.  Skin:    General: Skin is warm and dry.  Neurological:     General: No focal deficit present.     Mental Status: He is alert.  Psychiatric:        Mood and Affect: Mood normal.        Behavior: Behavior normal.     Lab Results  Component Value Date   WBC 8.0 01/05/2024   HGB 15.3 01/05/2024   HCT 45.6 01/05/2024   PLT 243.0 01/05/2024   GLUCOSE 109 (H) 01/05/2024   CHOL 125 01/16/2023   TRIG 138 01/16/2023   HDL 45 01/16/2023   LDLCALC 56 01/16/2023   ALT 21 05/20/2023   AST 28 05/20/2023   NA 137 01/05/2024   K 4.2 01/05/2024   CL 102  01/05/2024   CREATININE 1.07 01/05/2024   BUN 21 01/05/2024   CO2 26 01/05/2024   TSH 0.90 01/05/2024   PSA 0.3 01/17/2023   HGBA1C 6.1 01/05/2024   MICROALBUR -0.1 (L) 01/05/2024    CT ABDOMEN PELVIS W CONTRAST Result Date: 09/08/2023 CLINICAL DATA:  Abdominal pain, possible diverticulitis EXAM: CT ABDOMEN AND PELVIS WITH CONTRAST TECHNIQUE: Multidetector CT imaging of the abdomen and pelvis was performed using the standard protocol following bolus administration of intravenous contrast. RADIATION DOSE REDUCTION: This exam was performed according to the departmental dose-optimization program which includes automated exposure control,  adjustment of the mA and/or kV according to patient size and/or use of iterative reconstruction technique. CONTRAST:  ISOVUE-300 IOPAMIDOL (ISOVUE-300) INJECTION 61% COMPARISON:  05/21/2023 FINDINGS: Lower chest: No acute abnormality. Hepatobiliary: No solid liver abnormality is seen. Hepatic steatosis. No gallstones, gallbladder wall thickening, or biliary dilatation. Pancreas: Unremarkable. No pancreatic ductal dilatation or surrounding inflammatory changes. Spleen: Normal in size without significant abnormality. Adrenals/Urinary Tract: Adrenal glands are unremarkable. Kidneys are normal, without renal calculi, solid lesion, or hydronephrosis. Bladder is unremarkable. Stomach/Bowel: Stomach is within normal limits. Appendix appears normal. No evidence of bowel wall thickening, distention, or inflammatory changes. Descending and sigmoid diverticulosis. Minimal associated fat stranding and vascular combing without significant wall thickening (series 2, image 56). Vascular/Lymphatic: Scattered aortic atherosclerosis. No enlarged abdominal or pelvic lymph nodes. Reproductive: No mass or other significant abnormality. Partially imaged left hydrocele. Other: No abdominal wall hernia or abnormality. No ascites. Musculoskeletal: No acute or significant osseous findings.  IMPRESSION: 1. Descending and sigmoid diverticulosis. Minimal associated fat stranding and vascular combing without significant wall thickening. Findings suggest minimal uncomplicated diverticulitis. 2. Hepatic steatosis. 3. Partially imaged left hydrocele. Aortic Atherosclerosis (ICD10-I70.0). Electronically Signed   By: Jearld Lesch M.D.   On: 09/08/2023 17:41    Assessment & Plan:  SVT (supraventricular tachycardia) (HCC) -     EKG 12-Lead -     TSH; Future -     Metoprolol Succinate ER; Take 1 tablet (50 mg total) by mouth daily. Take with or immediately following a meal.  Dispense: 90 tablet; Refill: 0  Essential hypertension, benign -     Urinalysis, Routine w reflex microscopic; Future -     TSH; Future -     CBC with Differential/Platelet; Future  Prediabetes -     Hemoglobin A1c; Future -     Basic metabolic panel with GFR; Future  Hyperlipidemia with target LDL less than 100 -     Rosuvastatin Calcium; Take 1 tablet (10 mg total) by mouth daily.  Dispense: 90 tablet; Refill: 1 -     TSH; Future  Idiopathic gout of multiple sites, unspecified chronicity -     Uric acid; Future  Diverticulitis of large intestine without perforation or abscess without bleeding -     Lipase; Future -     CBC with Differential/Platelet; Future  Stage 3a chronic kidney disease (HCC) -     Urinalysis, Routine w reflex microscopic; Future -     Basic metabolic panel with GFR; Future -     Microalbumin / creatinine urine ratio; Future  Neuropathy -     Ambulatory referral to Neurology     Follow-up: No follow-ups on file.  Sanda Linger, MD

## 2024-01-01 DIAGNOSIS — G629 Polyneuropathy, unspecified: Secondary | ICD-10-CM | POA: Insufficient documentation

## 2024-01-05 ENCOUNTER — Other Ambulatory Visit (INDEPENDENT_AMBULATORY_CARE_PROVIDER_SITE_OTHER)

## 2024-01-05 ENCOUNTER — Encounter: Payer: Self-pay | Admitting: Internal Medicine

## 2024-01-05 DIAGNOSIS — M1009 Idiopathic gout, multiple sites: Secondary | ICD-10-CM

## 2024-01-05 DIAGNOSIS — I471 Supraventricular tachycardia, unspecified: Secondary | ICD-10-CM

## 2024-01-05 DIAGNOSIS — R7303 Prediabetes: Secondary | ICD-10-CM

## 2024-01-05 DIAGNOSIS — K5732 Diverticulitis of large intestine without perforation or abscess without bleeding: Secondary | ICD-10-CM | POA: Diagnosis not present

## 2024-01-05 DIAGNOSIS — I10 Essential (primary) hypertension: Secondary | ICD-10-CM

## 2024-01-05 DIAGNOSIS — N1831 Chronic kidney disease, stage 3a: Secondary | ICD-10-CM

## 2024-01-05 DIAGNOSIS — E785 Hyperlipidemia, unspecified: Secondary | ICD-10-CM | POA: Diagnosis not present

## 2024-01-05 LAB — CBC WITH DIFFERENTIAL/PLATELET
Basophils Absolute: 0 10*3/uL (ref 0.0–0.1)
Basophils Relative: 0.5 % (ref 0.0–3.0)
Eosinophils Absolute: 0 10*3/uL (ref 0.0–0.7)
Eosinophils Relative: 0.6 % (ref 0.0–5.0)
HCT: 45.6 % (ref 39.0–52.0)
Hemoglobin: 15.3 g/dL (ref 13.0–17.0)
Lymphocytes Relative: 18.2 % (ref 12.0–46.0)
Lymphs Abs: 1.5 10*3/uL (ref 0.7–4.0)
MCHC: 33.5 g/dL (ref 30.0–36.0)
MCV: 91.9 fl (ref 78.0–100.0)
Monocytes Absolute: 0.6 10*3/uL (ref 0.1–1.0)
Monocytes Relative: 7.4 % (ref 3.0–12.0)
Neutro Abs: 5.9 10*3/uL (ref 1.4–7.7)
Neutrophils Relative %: 73.3 % (ref 43.0–77.0)
Platelets: 243 10*3/uL (ref 150.0–400.0)
RBC: 4.96 Mil/uL (ref 4.22–5.81)
RDW: 13.7 % (ref 11.5–15.5)
WBC: 8 10*3/uL (ref 4.0–10.5)

## 2024-01-05 LAB — URINALYSIS, ROUTINE W REFLEX MICROSCOPIC
Bilirubin Urine: NEGATIVE
Hgb urine dipstick: NEGATIVE
Ketones, ur: NEGATIVE
Leukocytes,Ua: NEGATIVE
Nitrite: NEGATIVE
RBC / HPF: NONE SEEN (ref 0–?)
Specific Gravity, Urine: <=1.005 — AB (ref 1.000–1.030)
Total Protein, Urine: NEGATIVE
Urine Glucose: NEGATIVE
Urobilinogen, UA: 0.2 (ref 0.0–1.0)
WBC, UA: NONE SEEN (ref 0–?)
pH: 6 (ref 5.0–8.0)

## 2024-01-05 LAB — BASIC METABOLIC PANEL WITH GFR
BUN: 21 mg/dL (ref 6–23)
CO2: 26 meq/L (ref 19–32)
Calcium: 9.5 mg/dL (ref 8.4–10.5)
Chloride: 102 meq/L (ref 96–112)
Creatinine, Ser: 1.07 mg/dL (ref 0.40–1.50)
GFR: 73.72 mL/min (ref >60.00)
Glucose, Bld: 109 mg/dL — ABNORMAL HIGH (ref 70–99)
Potassium: 4.2 meq/L (ref 3.5–5.1)
Sodium: 137 meq/L (ref 135–145)

## 2024-01-05 LAB — MICROALBUMIN / CREATININE URINE RATIO
Creatinine,U: 24.1 mg/dL
Microalb Creat Ratio: UNDETERMINED mg/g (ref 0.0–30.0)
Microalb, Ur: -0.1 mg/dL — ABNORMAL LOW (ref 0.0–1.9)

## 2024-01-05 LAB — URIC ACID: Uric Acid, Serum: 5.6 mg/dL (ref 4.0–7.8)

## 2024-01-05 LAB — TSH: TSH: 0.9 u[IU]/mL (ref 0.35–5.50)

## 2024-01-05 LAB — HEMOGLOBIN A1C: Hgb A1c MFr Bld: 6.1 % (ref 4.6–6.5)

## 2024-01-05 LAB — LIPASE: Lipase: 21 U/L (ref 11.0–59.0)

## 2024-01-05 NOTE — Telephone Encounter (Signed)
 Pt came in to check on rx request.  Pt also asked about a receipt that he left during his last appointment, stated he had received 2 calls to come pick it up.   Please send RX and clarify about receipt

## 2024-01-06 ENCOUNTER — Encounter: Payer: Self-pay | Admitting: Internal Medicine

## 2024-01-08 MED ORDER — AMOXICILLIN-POT CLAVULANATE 875-125 MG PO TABS: 1.0000 | ORAL_TABLET | Freq: Two times a day (BID) | ORAL | 0 refills | Status: AC

## 2024-01-09 ENCOUNTER — Other Ambulatory Visit: Payer: Self-pay | Admitting: Internal Medicine

## 2024-01-09 DIAGNOSIS — M1000 Idiopathic gout, unspecified site: Secondary | ICD-10-CM

## 2024-01-16 ENCOUNTER — Other Ambulatory Visit: Payer: Self-pay | Admitting: Internal Medicine

## 2024-01-16 DIAGNOSIS — E785 Hyperlipidemia, unspecified: Secondary | ICD-10-CM

## 2024-03-29 ENCOUNTER — Other Ambulatory Visit: Payer: Self-pay | Admitting: Internal Medicine

## 2024-03-29 DIAGNOSIS — I471 Supraventricular tachycardia, unspecified: Secondary | ICD-10-CM

## 2024-06-13 ENCOUNTER — Other Ambulatory Visit: Payer: Self-pay | Admitting: Internal Medicine

## 2024-06-13 DIAGNOSIS — E785 Hyperlipidemia, unspecified: Secondary | ICD-10-CM

## 2024-08-23 ENCOUNTER — Other Ambulatory Visit: Payer: Self-pay | Admitting: Internal Medicine

## 2024-09-15 ENCOUNTER — Encounter: Payer: Self-pay | Admitting: Internal Medicine

## 2024-09-15 ENCOUNTER — Ambulatory Visit: Admitting: Internal Medicine

## 2024-09-15 VITALS — BP 132/78 | HR 60 | Temp 97.8°F | Resp 16 | Ht 68.0 in | Wt 223.4 lb

## 2024-09-15 DIAGNOSIS — M1009 Idiopathic gout, multiple sites: Secondary | ICD-10-CM

## 2024-09-15 DIAGNOSIS — E785 Hyperlipidemia, unspecified: Secondary | ICD-10-CM

## 2024-09-15 DIAGNOSIS — L989 Disorder of the skin and subcutaneous tissue, unspecified: Secondary | ICD-10-CM | POA: Insufficient documentation

## 2024-09-15 DIAGNOSIS — R7303 Prediabetes: Secondary | ICD-10-CM

## 2024-09-15 DIAGNOSIS — N1831 Chronic kidney disease, stage 3a: Secondary | ICD-10-CM

## 2024-09-15 DIAGNOSIS — I10 Essential (primary) hypertension: Secondary | ICD-10-CM

## 2024-09-15 DIAGNOSIS — M150 Primary generalized (osteo)arthritis: Secondary | ICD-10-CM | POA: Insufficient documentation

## 2024-09-15 DIAGNOSIS — M1000 Idiopathic gout, unspecified site: Secondary | ICD-10-CM

## 2024-09-15 DIAGNOSIS — Z23 Encounter for immunization: Secondary | ICD-10-CM | POA: Insufficient documentation

## 2024-09-15 DIAGNOSIS — Z0000 Encounter for general adult medical examination without abnormal findings: Secondary | ICD-10-CM

## 2024-09-15 LAB — PSA: PSA: 0.3

## 2024-09-15 NOTE — Patient Instructions (Signed)
 Health Maintenance, Male  Adopting a healthy lifestyle and getting preventive care are important in promoting health and wellness. Ask your health care provider about:  The right schedule for you to have regular tests and exams.  Things you can do on your own to prevent diseases and keep yourself healthy.  What should I know about diet, weight, and exercise?  Eat a healthy diet    Eat a diet that includes plenty of vegetables, fruits, low-fat dairy products, and lean protein.  Do not eat a lot of foods that are high in solid fats, added sugars, or sodium.  Maintain a healthy weight  Body mass index (BMI) is a measurement that can be used to identify possible weight problems. It estimates body fat based on height and weight. Your health care provider can help determine your BMI and help you achieve or maintain a healthy weight.  Get regular exercise  Get regular exercise. This is one of the most important things you can do for your health. Most adults should:  Exercise for at least 150 minutes each week. The exercise should increase your heart rate and make you sweat (moderate-intensity exercise).  Do strengthening exercises at least twice a week. This is in addition to the moderate-intensity exercise.  Spend less time sitting. Even light physical activity can be beneficial.  Watch cholesterol and blood lipids  Have your blood tested for lipids and cholesterol at 64 years of age, then have this test every 5 years.  You may need to have your cholesterol levels checked more often if:  Your lipid or cholesterol levels are high.  You are older than 64 years of age.  You are at high risk for heart disease.  What should I know about cancer screening?  Many types of cancers can be detected early and may often be prevented. Depending on your health history and family history, you may need to have cancer screening at various ages. This may include screening for:  Colorectal cancer.  Prostate cancer.  Skin cancer.  Lung  cancer.  What should I know about heart disease, diabetes, and high blood pressure?  Blood pressure and heart disease  High blood pressure causes heart disease and increases the risk of stroke. This is more likely to develop in people who have high blood pressure readings or are overweight.  Talk with your health care provider about your target blood pressure readings.  Have your blood pressure checked:  Every 3-5 years if you are 24-52 years of age.  Every year if you are 3 years old or older.  If you are between the ages of 60 and 72 and are a current or former smoker, ask your health care provider if you should have a one-time screening for abdominal aortic aneurysm (AAA).  Diabetes  Have regular diabetes screenings. This checks your fasting blood sugar level. Have the screening done:  Once every three years after age 66 if you are at a normal weight and have a low risk for diabetes.  More often and at a younger age if you are overweight or have a high risk for diabetes.  What should I know about preventing infection?  Hepatitis B  If you have a higher risk for hepatitis B, you should be screened for this virus. Talk with your health care provider to find out if you are at risk for hepatitis B infection.  Hepatitis C  Blood testing is recommended for:  Everyone born from [birth year] through 1966.  Anyone  with known risk factors for hepatitis C.  Sexually transmitted infections (STIs)  You should be screened each year for STIs, including gonorrhea and chlamydia, if:  You are sexually active and are younger than 64 years of age.  You are older than 64 years of age and your health care provider tells you that you are at risk for this type of infection.  Your sexual activity has changed since you were last screened, and you are at increased risk for chlamydia or gonorrhea. Ask your health care provider if you are at risk.  Ask your health care provider about whether you are at high risk for HIV. Your health care provider  may recommend a prescription medicine to help prevent HIV infection. If you choose to take medicine to prevent HIV, you should first get tested for HIV. You should then be tested every 3 months for as long as you are taking the medicine.  Follow these instructions at home:  Alcohol use  Do not drink alcohol if your health care provider tells you not to drink.  If you drink alcohol:  Limit how much you have to 0-2 drinks a day.  Know how much alcohol is in your drink. In the [place] one drink equals one 12 oz bottle of beer (355 mL), one 5 oz glass of wine (148 mL), or one 1 oz glass of hard liquor (44 mL).  Lifestyle  Do not use any products that contain nicotine or tobacco. These products include cigarettes, chewing tobacco, and vaping devices, such as e-cigarettes. If you need help quitting, ask your health care provider.  Do not use street drugs.  Do not share needles.  Ask your health care provider for help if you need support or information about quitting drugs.  General instructions  Schedule regular health, dental, and eye exams.  Stay current with your vaccines.  Tell your health care provider if:  You often feel depressed.  You have ever been abused or do not feel safe at home.  Summary  Adopting a healthy lifestyle and getting preventive care are important in promoting health and wellness.  Follow your health care provider's instructions about healthy diet, exercising, and getting tested or screened for diseases.  Follow your health care provider's instructions on monitoring your cholesterol and blood pressure.  This information is not intended to replace advice given to you by your health care provider. Make sure you discuss any questions you have with your health care provider.  Document Revised: 02/12/2021 Document Reviewed: 02/12/2021  Elsevier Patient Education  2024 [employer].

## 2024-09-15 NOTE — Progress Notes (Unsigned)
 Subjective:  Patient ID: Mark [name], male    DOB: 10-18-1960  Age: 63 y.o. MRN: [id number]  CC: Hypertension, Diabetes, Hyperlipidemia, and Annual Exam   [name] presents for a CPX and f/up ----  Discussed the use of AI scribe software for clinical note transcription with the patient, who gave verbal consent to proceed.  History of Present Illness Mark [name] is a 63 year old male with osteoarthritis who presents with joint pain management.  He experiences significant pain in his right shoulder due to the absence of cartilage, and was told by the orthopedist that he would need a replacement. He finds the post-surgery lifting restriction of forty pounds unacceptable. He manages the pain with over-the-counter Tylenol  and ibuprofen  but experiences breakthrough pain affecting his sleep. He has not taken prescribed pain medication but recently used a leftover prescription from his daughter, which helped him sleep.  He also has pain in both hips attributed to osteoarthritis. Despite this, he engages in extensive physical activity, performing thousands of reps weekly at the gym, but avoids heavy lifting to prevent exacerbating his shoulder condition.  He reports burning sensations in his feet, particularly at night, which he associates with having had COVID-19 twice. Relief is found by wearing [employer]chers shoes, and the burning is severe enough that he cannot tolerate sheets touching his feet.  He has a history of diverticulitis, which has improved significantly after discontinuing certain supplements. He continues to take brain vitamins, glucosamine, chondroitin, and Armour Colostrum, which he believes have improved his overall health, including hair and skin condition.  No recent abdominal pain or issues with urination. He has no family history of prostate cancer and his last PSA was 0.3. He plans to retire soon and is looking forward to spending time on a new boat between the [place] [employer] [employer] and his current location.     Outpatient Medications Prior to Visit  Medication Sig Dispense Refill   Cholecalciferol (VITAMIN D3) 10 MCG (400 UNIT) tablet Take by mouth.     diclofenac  Sodium (VOLTAREN ) 1 % GEL Apply 4 g topically 4 (four) times daily. 350 g 1   EPINEPHrine  0.3 mg/0.3 mL IJ SOAJ injection Inject 0.3 mg into the muscle as needed (ANAPHYLAXIS). 2 each 2   Glucosamine-Chondroit-Vit C-Mn (GLUCOSAMINE 1500 COMPLEX PO) Take 1 tablet by mouth daily.      metoprolol  succinate (TOPROL -XL) 50 MG 24 hr tablet TAKE 1 TABLET BY MOUTH DAILY  WITH OR IMMEDIATELY FOLLOWING A  MEAL 90 tablet 3   pantoprazole  (PROTONIX ) 40 MG tablet Take 1 tablet (40 mg total) by mouth daily. 30 tablet 0   PREVIDENT 5000 BOOSTER PLUS 1.1 % PSTE 2 (two) times daily.     rizatriptan  (MAXALT ) 10 MG tablet Take 1 tablet (10 mg total) by mouth as needed for migraine. May repeat in 2 hours if needed 10 tablet 5   allopurinol  (ZYLOPRIM ) 300 MG tablet TAKE 1 TABLET BY MOUTH DAILY 90 tablet 3   rosuvastatin  (CRESTOR ) 10 MG tablet TAKE 1 TABLET BY MOUTH DAILY 90 tablet 0   No facility-administered medications prior to visit.    ROS Review of Systems  Objective:  BP 132/78 (BP Location: Left Arm, Patient Position: Sitting, Cuff Size: Normal)   Pulse 60   Temp 97.8 F (36.6 C) (Oral)   Resp 16   Ht 5' 8 (1.727 m)   Wt 223 lb 6.4 oz (101.3 kg)   SpO2 96%   BMI 33.97 kg/m  BP Readings from Last 3 Encounters:  09/15/24 132/78  12/31/23 130/66  09/30/23 126/80    Wt Readings from Last 3 Encounters:  09/15/24 223 lb 6.4 oz (101.3 kg)  12/31/23 223 lb 6.4 oz (101.3 kg)  09/30/23 220 lb (99.8 kg)    Physical Exam  Lab Results  Component Value Date   WBC 7.4 09/15/2024   HGB 14.4 09/15/2024   HCT 43.0 09/15/2024   PLT 233 09/15/2024   GLUCOSE 89 09/15/2024   CHOL 120 09/15/2024   TRIG 113 09/15/2024   HDL 40 09/15/2024   LDLCALC 59 09/15/2024   ALT 21 09/15/2024   AST 30  09/15/2024   NA 140 09/15/2024   K 4.5 09/15/2024   CL 104 09/15/2024   CREATININE 1.19 09/15/2024   BUN 19 09/15/2024   CO2 26 09/15/2024   TSH 0.920 09/15/2024   PSA 0.3 09/15/2024   HGBA1C 6.0 (H) 09/15/2024   MICROALBUR -0.1 (L) 01/05/2024    CT ABDOMEN PELVIS W CONTRAST Result Date: 09/08/2023 CLINICAL DATA:  Abdominal pain, possible diverticulitis EXAM: CT ABDOMEN AND PELVIS WITH CONTRAST TECHNIQUE: Multidetector CT imaging of the abdomen and pelvis was performed using the standard protocol following bolus administration of intravenous contrast. RADIATION DOSE REDUCTION: This exam was performed according to the departmental dose-optimization program which includes automated exposure control, adjustment of the mA and/or kV according to patient size and/or use of iterative reconstruction technique. CONTRAST:  ISOVUE -300 IOPAMIDOL  (ISOVUE -300) INJECTION 61% COMPARISON:  05/21/2023 FINDINGS: Lower chest: No acute abnormality. Hepatobiliary: No solid liver abnormality is seen. Hepatic steatosis. No gallstones, gallbladder wall thickening, or biliary dilatation. Pancreas: Unremarkable. No pancreatic ductal dilatation or surrounding inflammatory changes. Spleen: Normal in size without significant abnormality. Adrenals/Urinary Tract: Adrenal glands are unremarkable. Kidneys are normal, without renal calculi, solid lesion, or hydronephrosis. Bladder is unremarkable. Stomach/Bowel: Stomach is within normal limits. Appendix appears normal. No evidence of bowel wall thickening, distention, or inflammatory changes. Descending and sigmoid diverticulosis. Minimal associated fat stranding and vascular combing without significant wall thickening (series 2, image 56). Vascular/Lymphatic: Scattered aortic atherosclerosis. No enlarged abdominal or pelvic lymph nodes. Reproductive: No mass or other significant abnormality. Partially imaged left hydrocele. Other: No abdominal wall hernia or abnormality. No  ascites. Musculoskeletal: No acute or significant osseous findings. IMPRESSION: 1. Descending and sigmoid diverticulosis. Minimal associated fat stranding and vascular combing without significant wall thickening. Findings suggest minimal uncomplicated diverticulitis. 2. Hepatic steatosis. 3. Partially imaged left hydrocele. Aortic Atherosclerosis (ICD10-I70.0). Electronically Signed   By: Marolyn JONETTA Jaksch M.D.   On: 09/08/2023 17:41    Estimated Creatinine Clearance: 72.4 mL/min (by C-G formula based on SCr of 1.19 mg/dL).   The ASCVD Risk score (Arnett DK, et al., 2019) failed to calculate for the following reasons:   The valid total cholesterol range is 130 to 320 mg/dL   Assessment & Plan:  Hyperlipidemia with target LDL less than 100 -     Lipid panel; Future -     TSH; Future -     Hepatic function panel; Future -     Lead, blood (adult age 70 yrs or greater); Future -     Rosuvastatin  Calcium ; Take 1 tablet (10 mg total) by mouth daily.  Dispense: 90 tablet; Refill: 1  Essential hypertension, benign -     TSH; Future -     Urinalysis, Routine w reflex microscopic; Future -     Hepatic function panel; Future -     Lead, blood (adult age  16 yrs or greater); Future -     CBC with Differential/Platelet; Future -     Basic metabolic panel with GFR; Future  Prediabetes -     Hemoglobin A1c; Future -     Lead, blood (adult age 39 yrs or greater); Future -     Basic metabolic panel with GFR; Future  Stage 3a chronic kidney disease (HCC) -     Urinalysis, Routine w reflex microscopic; Future -     Lead, blood (adult age 11 yrs or greater); Future -     CBC with Differential/Platelet; Future -     Basic metabolic panel with GFR; Future  Idiopathic gout of multiple sites, unspecified chronicity -     Uric acid; Future -     Lead, blood (adult age 4 yrs or greater); Future -     Celecoxib ; Take 1 capsule (100 mg total) by mouth daily.  Dispense: 90 capsule; Refill: 1 -     Allopurinol ;  Take 1 tablet (300 mg total) by mouth daily.  Dispense: 90 tablet; Refill: 1  Routine general medical examination at a health care facility -     Lipid panel; Future -     PSA; Future -     Lead, blood (adult age 7 yrs or greater); Future  Immunization due -     Pneumococcal conjugate vaccine 20-valent -     Lead, blood (adult age 74 yrs or greater); Future  Need for immunization against influenza -     Flu vaccine trivalent PF, 6mos and older(Flulaval,Afluria,Fluarix,Fluzone) -     Lead, blood (adult age 89 yrs or greater); Future  Primary osteoarthritis involving multiple joints -     Lead, blood (adult age 64 yrs or greater); Future -     Celecoxib ; Take 1 capsule (100 mg total) by mouth daily.  Dispense: 90 capsule; Refill: 1  Skin lesion of face -     Ambulatory referral to Dermatology  Idiopathic gout, unspecified chronicity, unspecified site     Follow-up: Return in about 6 months (around 03/16/2025).  Debby Molt, MD

## 2024-09-16 ENCOUNTER — Ambulatory Visit: Payer: Self-pay | Admitting: Internal Medicine

## 2024-09-16 ENCOUNTER — Encounter: Payer: Self-pay | Admitting: Internal Medicine

## 2024-09-16 LAB — CBC WITH DIFFERENTIAL/PLATELET
Basophils Absolute: 0 x10E3/uL (ref 0.0–0.2)
Basos: 0 %
EOS (ABSOLUTE): 0.1 x10E3/uL (ref 0.0–0.4)
Eos: 2 %
Hematocrit: 43 % (ref 37.5–51.0)
Hemoglobin: 14.4 g/dL (ref 13.0–17.7)
Immature Grans (Abs): 0 x10E3/uL (ref 0.0–0.1)
Immature Granulocytes: 0 %
Lymphocytes Absolute: 2.1 x10E3/uL (ref 0.7–3.1)
Lymphs: 29 %
MCH: 31 pg (ref 26.6–33.0)
MCHC: 33.5 g/dL (ref 31.5–35.7)
MCV: 93 fL (ref 79–97)
Monocytes Absolute: 0.9 x10E3/uL (ref 0.1–0.9)
Monocytes: 12 %
Neutrophils Absolute: 4.2 x10E3/uL (ref 1.4–7.0)
Neutrophils: 57 %
Platelets: 233 x10E3/uL (ref 150–450)
RBC: 4.65 x10E6/uL (ref 4.14–5.80)
RDW: 12.4 % (ref 11.6–15.4)
WBC: 7.4 x10E3/uL (ref 3.4–10.8)

## 2024-09-16 LAB — LIPID PANEL
Chol/HDL Ratio: 3 ratio (ref 0.0–5.0)
Cholesterol, Total: 120 mg/dL (ref 100–199)
HDL: 40 mg/dL (ref >39)
LDL Chol Calc (NIH): 59 mg/dL (ref 0–99)
Triglycerides: 113 mg/dL (ref 0–149)
VLDL Cholesterol Cal: 21 mg/dL (ref 5–40)

## 2024-09-16 LAB — PSA: Prostate Specific Ag, Serum: 0.3 ng/mL (ref 0.0–4.0)

## 2024-09-16 LAB — URINALYSIS, ROUTINE W REFLEX MICROSCOPIC
Bilirubin, UA: NEGATIVE
Glucose, UA: NEGATIVE
Ketones, UA: NEGATIVE
Leukocytes,UA: NEGATIVE
Nitrite, UA: NEGATIVE
Protein,UA: NEGATIVE
RBC, UA: NEGATIVE
Specific Gravity, UA: 1.009 (ref 1.005–1.030)
Urobilinogen, Ur: 0.2 mg/dL (ref 0.2–1.0)
pH, UA: 6.5 (ref 5.0–7.5)

## 2024-09-16 LAB — HEMOGLOBIN A1C
Est. average glucose Bld gHb Est-mCnc: 126 mg/dL
Hgb A1c MFr Bld: 6 % — ABNORMAL HIGH (ref 4.8–5.6)

## 2024-09-16 LAB — BASIC METABOLIC PANEL WITH GFR
BUN/Creatinine Ratio: 16 (ref 10–24)
BUN: 19 mg/dL (ref 8–27)
CO2: 26 mmol/L (ref 20–29)
Calcium: 9.1 mg/dL (ref 8.6–10.2)
Chloride: 104 mmol/L (ref 96–106)
Creatinine, Ser: 1.19 mg/dL (ref 0.76–1.27)
Glucose: 89 mg/dL (ref 70–99)
Potassium: 4.5 mmol/L (ref 3.5–5.2)
Sodium: 140 mmol/L (ref 134–144)
eGFR: 68 mL/min/1.73 (ref >59)

## 2024-09-16 LAB — HEPATIC FUNCTION PANEL
ALT: 21 IU/L (ref 0–44)
AST: 30 IU/L (ref 0–40)
Albumin: 4.5 g/dL (ref 3.9–4.9)
Alkaline Phosphatase: 77 IU/L (ref 47–123)
Bilirubin Total: 0.5 mg/dL (ref 0.0–1.2)
Bilirubin, Direct: 0.16 mg/dL (ref 0.00–0.40)
Total Protein: 6.3 g/dL (ref 6.0–8.5)

## 2024-09-16 LAB — URIC ACID: Uric Acid: 4.7 mg/dL (ref 3.8–8.4)

## 2024-09-16 LAB — LEAD, BLOOD (ADULT >= 16 YRS): Lead-Whole Blood: 1.3 ug/dL (ref 0.0–3.4)

## 2024-09-16 LAB — TSH: TSH: 0.92 u[IU]/mL (ref 0.450–4.500)

## 2024-09-16 MED ORDER — ROSUVASTATIN CALCIUM 10 MG PO TABS: 10.0000 mg | ORAL_TABLET | Freq: Every day | ORAL | 1 refills | Status: DC

## 2024-09-16 MED ORDER — CELECOXIB 100 MG PO CAPS: 100.0000 mg | ORAL_CAPSULE | Freq: Every day | ORAL | 1 refills | Status: AC

## 2024-09-16 MED ORDER — ALLOPURINOL 300 MG PO TABS: 300.0000 mg | ORAL_TABLET | Freq: Every day | ORAL | 1 refills | Status: AC

## 2024-10-24 ENCOUNTER — Other Ambulatory Visit: Payer: Self-pay | Admitting: Internal Medicine

## 2024-10-24 DIAGNOSIS — E785 Hyperlipidemia, unspecified: Secondary | ICD-10-CM

## 2024-11-04 ENCOUNTER — Other Ambulatory Visit: Payer: Self-pay | Admitting: Internal Medicine
